# Patient Record
Sex: Male | Born: 1945
Health system: Southern US, Community
[De-identification: ages and names within clinical notes are randomized; demographics above are authoritative.]

## PROBLEM LIST (undated history)

## (undated) DIAGNOSIS — M48061 Spinal stenosis, lumbar region without neurogenic claudication: Secondary | ICD-10-CM

## (undated) DIAGNOSIS — T8859XA Other complications of anesthesia, initial encounter: Secondary | ICD-10-CM

## (undated) DIAGNOSIS — N400 Enlarged prostate without lower urinary tract symptoms: Secondary | ICD-10-CM

## (undated) DIAGNOSIS — H269 Unspecified cataract: Secondary | ICD-10-CM

## (undated) DIAGNOSIS — L02612 Cutaneous abscess of left foot: Secondary | ICD-10-CM

## (undated) DIAGNOSIS — I10 Essential (primary) hypertension: Secondary | ICD-10-CM

## (undated) DIAGNOSIS — L0390 Cellulitis, unspecified: Secondary | ICD-10-CM

## (undated) DIAGNOSIS — M549 Dorsalgia, unspecified: Secondary | ICD-10-CM

## (undated) DIAGNOSIS — M1990 Unspecified osteoarthritis, unspecified site: Secondary | ICD-10-CM

## (undated) DIAGNOSIS — E669 Obesity, unspecified: Secondary | ICD-10-CM

## (undated) HISTORY — PX: APPENDECTOMY: SHX54

## (undated) HISTORY — PX: JOINT REPLACEMENT: SHX530

## (undated) HISTORY — DX: Unspecified cataract: H26.9

## (undated) HISTORY — PX: OTHER SURGICAL HISTORY: SHX169

## (undated) HISTORY — DX: Spinal stenosis, lumbar region without neurogenic claudication: M48.061

## (undated) HISTORY — PX: KNEE ARTHROSCOPY: SUR90

## (undated) HISTORY — PX: CHOLECYSTECTOMY: SHX55

---
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
## 1980-01-05 HISTORY — PX: GASTRIC FUNDOPLICATION: SHX226

## 1982-01-04 HISTORY — PX: CHOLECYSTECTOMY: SHX55

## 1999-09-21 ENCOUNTER — Ambulatory Visit (HOSPITAL_COMMUNITY): Admission: RE | Admit: 1999-09-21 | Discharge: 1999-09-21 | Payer: Self-pay | Admitting: Orthopedic Surgery

## 1999-09-21 ENCOUNTER — Encounter: Payer: Self-pay | Admitting: Orthopedic Surgery

## 2011-07-30 ENCOUNTER — Inpatient Hospital Stay (HOSPITAL_COMMUNITY)
Admission: EM | Admit: 2011-07-30 | Discharge: 2011-08-03 | DRG: 603 | Disposition: A | Discharge status: Home / Self Care | Payer: Worker's Compensation | Attending: Family Medicine | Admitting: Family Medicine

## 2011-07-30 ENCOUNTER — Encounter (HOSPITAL_COMMUNITY): Payer: Self-pay | Admitting: Emergency Medicine

## 2011-07-30 DIAGNOSIS — S99919A Unspecified injury of unspecified ankle, initial encounter: Secondary | ICD-10-CM | POA: Diagnosis present

## 2011-07-30 DIAGNOSIS — L0291 Cutaneous abscess, unspecified: Secondary | ICD-10-CM

## 2011-07-30 DIAGNOSIS — L03119 Cellulitis of unspecified part of limb: Principal | ICD-10-CM | POA: Diagnosis present

## 2011-07-30 DIAGNOSIS — E8881 Metabolic syndrome: Secondary | ICD-10-CM | POA: Diagnosis present

## 2011-07-30 DIAGNOSIS — R609 Edema, unspecified: Secondary | ICD-10-CM | POA: Diagnosis present

## 2011-07-30 DIAGNOSIS — W208XXA Other cause of strike by thrown, projected or falling object, initial encounter: Secondary | ICD-10-CM | POA: Diagnosis present

## 2011-07-30 DIAGNOSIS — M7989 Other specified soft tissue disorders: Secondary | ICD-10-CM | POA: Diagnosis present

## 2011-07-30 DIAGNOSIS — Y9289 Other specified places as the place of occurrence of the external cause: Secondary | ICD-10-CM

## 2011-07-30 DIAGNOSIS — S8990XA Unspecified injury of unspecified lower leg, initial encounter: Secondary | ICD-10-CM | POA: Diagnosis present

## 2011-07-30 DIAGNOSIS — Y990 Civilian activity done for income or pay: Secondary | ICD-10-CM

## 2011-07-30 DIAGNOSIS — Z79899 Other long term (current) drug therapy: Secondary | ICD-10-CM

## 2011-07-30 DIAGNOSIS — L988 Other specified disorders of the skin and subcutaneous tissue: Secondary | ICD-10-CM | POA: Diagnosis present

## 2011-07-30 DIAGNOSIS — L0390 Cellulitis, unspecified: Secondary | ICD-10-CM

## 2011-07-30 DIAGNOSIS — N400 Enlarged prostate without lower urinary tract symptoms: Secondary | ICD-10-CM | POA: Diagnosis present

## 2011-07-30 DIAGNOSIS — I10 Essential (primary) hypertension: Secondary | ICD-10-CM | POA: Diagnosis present

## 2011-07-30 DIAGNOSIS — L02419 Cutaneous abscess of limb, unspecified: Principal | ICD-10-CM | POA: Diagnosis present

## 2011-07-30 DIAGNOSIS — Z6841 Body Mass Index (BMI) 40.0 and over, adult: Secondary | ICD-10-CM

## 2011-07-30 HISTORY — DX: Benign prostatic hyperplasia without lower urinary tract symptoms: N40.0

## 2011-07-30 HISTORY — DX: Dorsalgia, unspecified: M54.9

## 2011-07-30 HISTORY — DX: Essential (primary) hypertension: I10

## 2011-07-30 HISTORY — DX: Unspecified osteoarthritis, unspecified site: M19.90

## 2011-07-30 LAB — CBC WITH DIFFERENTIAL/PLATELET
Basophils Absolute: 0 10*3/uL (ref 0.0–0.1)
Basophils Relative: 0 % (ref 0–1)
Eosinophils Absolute: 0.2 10*3/uL (ref 0.0–0.7)
Eosinophils Relative: 1 % (ref 0–5)
HCT: 41.1 % (ref 39.0–52.0)
Hemoglobin: 13.9 g/dL (ref 13.0–17.0)
Lymphocytes Relative: 6 % — ABNORMAL LOW (ref 12–46)
Lymphs Abs: 0.8 10*3/uL (ref 0.7–4.0)
MCH: 30.1 pg (ref 26.0–34.0)
MCHC: 33.8 g/dL (ref 30.0–36.0)
MCV: 89 fL (ref 78.0–100.0)
Monocytes Absolute: 1.8 10*3/uL — ABNORMAL HIGH (ref 0.1–1.0)
Monocytes Relative: 12 % (ref 3–12)
Neutro Abs: 11.8 10*3/uL — ABNORMAL HIGH (ref 1.7–7.7)
Neutrophils Relative %: 81 % — ABNORMAL HIGH (ref 43–77)
Platelets: 242 10*3/uL (ref 150–400)
RBC: 4.62 MIL/uL (ref 4.22–5.81)
RDW: 13.7 % (ref 11.5–15.5)
WBC: 14.6 10*3/uL — ABNORMAL HIGH (ref 4.0–10.5)

## 2011-07-30 LAB — COMPREHENSIVE METABOLIC PANEL
ALT: 13 U/L (ref 0–53)
AST: 18 U/L (ref 0–37)
Albumin: 3 g/dL — ABNORMAL LOW (ref 3.5–5.2)
Alkaline Phosphatase: 96 U/L (ref 39–117)
BUN: 21 mg/dL (ref 6–23)
CO2: 26 mEq/L (ref 19–32)
Calcium: 9 mg/dL (ref 8.4–10.5)
Chloride: 102 mEq/L (ref 96–112)
Creatinine, Ser: 0.93 mg/dL (ref 0.50–1.35)
GFR calc Af Amer: >90 mL/min (ref >90)
GFR calc non Af Amer: 86 mL/min — ABNORMAL LOW (ref >90)
Glucose, Bld: 109 mg/dL — ABNORMAL HIGH (ref 70–99)
Potassium: 3.6 mEq/L (ref 3.5–5.1)
Sodium: 138 mEq/L (ref 135–145)
Total Bilirubin: 1.4 mg/dL — ABNORMAL HIGH (ref 0.3–1.2)
Total Protein: 7.2 g/dL (ref 6.0–8.3)

## 2011-07-30 LAB — D-DIMER, QUANTITATIVE: D-Dimer, Quant: 0.99 ug/mL-FEU — ABNORMAL HIGH (ref 0.00–0.48)

## 2011-07-30 IMAGING — CR DG FOOT COMPLETE 3+V*L*
2 series · 2 of 2 positions shown · non-contrast
Comparison: None

CLINICAL DATA: 65-year-old male with left foot pain and swelling
following injury.

LEFT FOOT - COMPLETE 3+ VIEW

[AP]
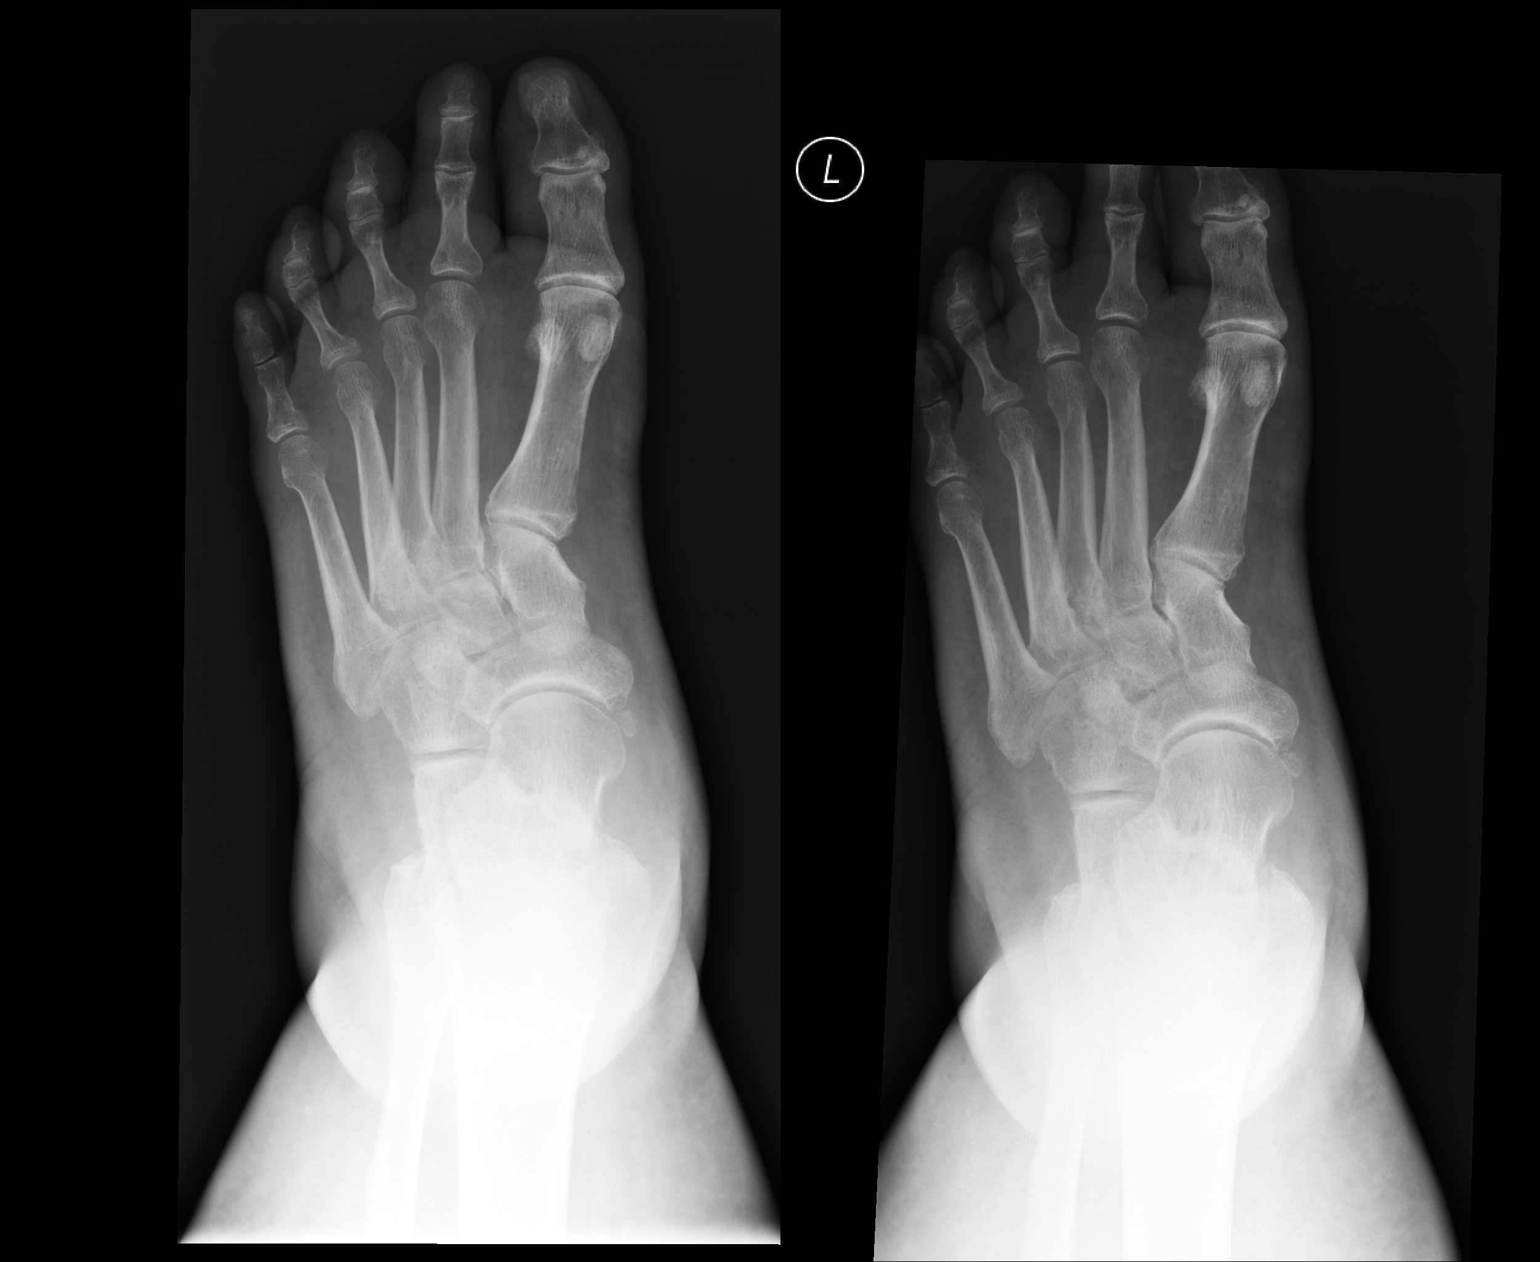

[lateral]
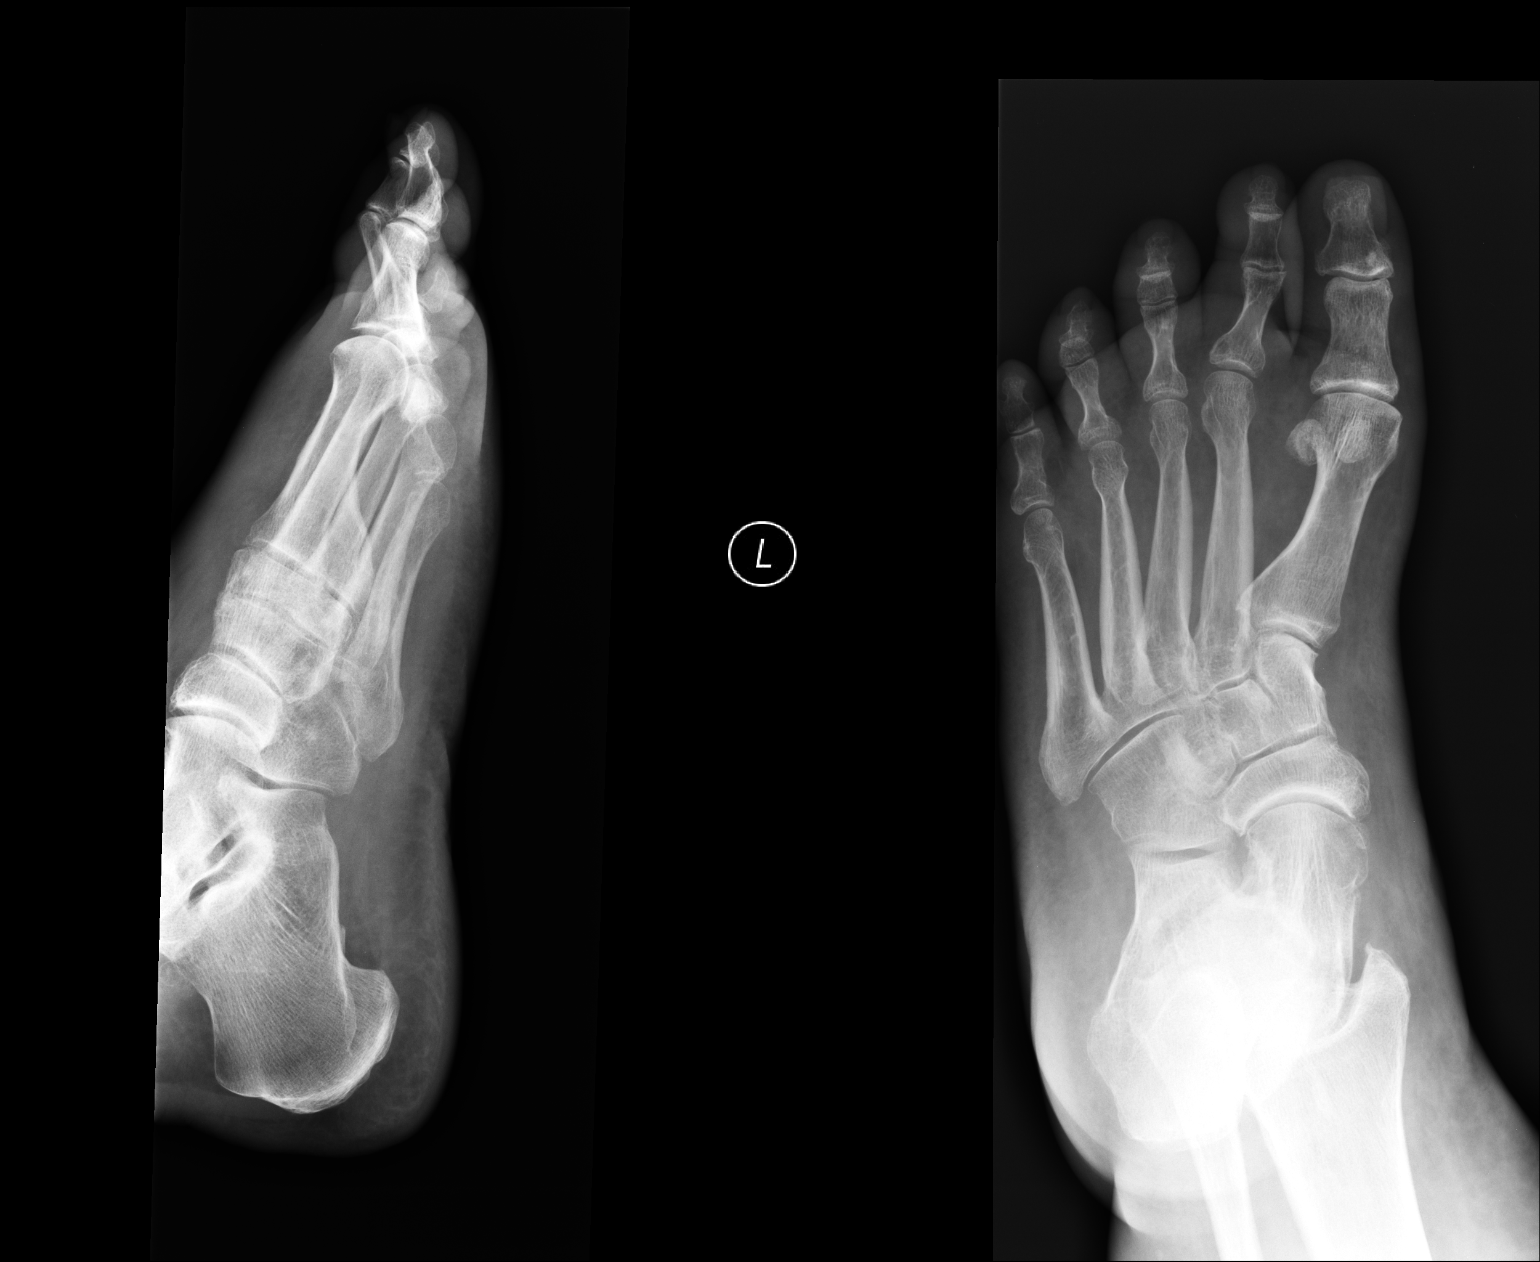

[2 of 2 positions shown; findings below may reference images not displayed]

FINDINGS: There is no evidence of acute fracture, subluxation, or
dislocation.
The Lisfranc joints are intact.
No focal bony lesions are identified.
There is no evidence of radiopaque foreign body.

Midfoot degenerative changes are identified.
IMPRESSION: No evidence of acute bony abnormality.

## 2011-07-30 MED ORDER — HYDROMORPHONE HCL PF 1 MG/ML IJ SOLN
1.0000 mg | Freq: Once | INTRAMUSCULAR | Status: AC
  Administered 2011-07-30: 1 mg via INTRAVENOUS
  Filled 2011-07-30: qty 1

## 2011-07-30 MED ORDER — VANCOMYCIN HCL IN DEXTROSE 1-5 GM/200ML-% IV SOLN
1000.0000 mg | Freq: Once | INTRAVENOUS | Status: AC
  Administered 2011-07-30: 1000 mg via INTRAVENOUS
  Filled 2011-07-30: qty 200

## 2011-07-30 MED ORDER — ONDANSETRON HCL 4 MG/2ML IJ SOLN
4.0000 mg | Freq: Once | INTRAMUSCULAR | Status: AC
  Administered 2011-07-30: 4 mg via INTRAVENOUS
  Filled 2011-07-30: qty 2

## 2011-07-30 MED ORDER — ENOXAPARIN SODIUM 150 MG/ML ~~LOC~~ SOLN
150.0000 mg | Freq: Once | SUBCUTANEOUS | Status: AC
  Administered 2011-07-30: 150 mg via SUBCUTANEOUS
  Filled 2011-07-30: qty 1

## 2011-07-30 NOTE — ED Notes (Signed)
Pt states he is having pain in his left leg and foot   Pt states pain started Monday after riding on an airplane  [facility] and sent here for possible DVT   Pt has lab work and CD with him that was sent from there  Pt has swelling noted to his foot and leg with redness noted

## 2011-07-30 NOTE — ED Notes (Signed)
RUE:[id number]<JW> Expected date:<BR> Expected time:<BR> Means of arrival:<BR> Comments:<BR> Hold for triage 3

## 2011-07-30 NOTE — ED Provider Notes (Signed)
History     CSN: [id number]  Arrival date & time 07/30/11  1940   First MD Initiated Contact with Patient 07/30/11 2024      Chief Complaint  Patient presents with  . Leg Pain    (Consider location/radiation/quality/duration/timing/severity/associated sxs/prior treatment) HPI Comments: Patient states, that approximately one month ago.  He dropped a steel bar on his left foot, causing bruising that has resolved.  He did fly to [place], on Monday, which was 4, days ago, and noticed on arrival, that he has significant swelling and pain in his left foot, and erythema.  This erythema has now extended to just below the knee.  Today, he developed myalgias, and felt "bad."  He went to urgent care, who performed a CBC, which revealed a white count of 17.5.  I also x-rayed his foot, which I reviewed and reveals no fractures or gas within the tissues.  They sent him to the emergency department to be further evaluated for DVT and to treat the elevated white count  Patient is a 66 y.o. male presenting with leg pain. The history is provided by the patient.  Leg Pain  The incident occurred more than 2 days ago. The incident occurred at work. The injury mechanism is unknown. The pain is present in the left leg and left foot. The pain is at a severity of 5/10. The pain is moderate. The pain has been constant since onset. Pertinent negatives include no numbness, no loss of motion and no loss of sensation. The symptoms are aggravated by activity.    Past Medical History  Diagnosis Date  . Back pain   . Arthritis   . Hypertension   . Enlarged prostate     Past Surgical History  Procedure Date  . Stomach stapled   . Cholecystectomy   . Appendectomy   . Arthroscopic knee surgery     Family History  Problem Relation Age of Onset  . Diabetes Other   . Stroke Other   . Hypertension Other   . Coronary artery disease Other   . Heart failure Other     History  Substance Use Topics  . Smoking  status: Not on file  . Smokeless tobacco: Not on file  . Alcohol Use: No      Review of Systems  Constitutional: Positive for chills. Negative for fever.  Respiratory: Negative for shortness of breath.   Gastrointestinal: Negative for nausea.  Musculoskeletal: Positive for joint swelling.  Skin: Positive for wound.  Neurological: Negative for dizziness, weakness and numbness.    Allergies  Review of patient's allergies indicates no known allergies.  Home Medications   Current Outpatient Rx  Name Route Sig Dispense Refill  . GLUCOSAMINE-CHONDROITIN 500-400 MG PO TABS Oral Take 1 tablet by mouth 2 (two) times daily.    Marland Kitchen HYDROCODONE-ACETAMINOPHEN 5-500 MG PO TABS Oral Take 1 tablet by mouth every 6 (six) hours as needed. FOR PAIN    . INDOMETHACIN 25 MG PO CAPS Oral Take 50 mg by mouth 2 (two) times daily with a meal.    . SAW PALMETTO (SERENOA REPENS) 80 MG PO CAPS Oral Take 160 mg by mouth 2 (two) times daily.      BP 125/65  Pulse 106  Temp 99.5 F (37.5 C) (Oral)  Resp 20  Ht 6' (1.829 m)  Wt 333 lb (151.048 kg)  BMI 45.16 kg/m2  SpO2 92%  Physical Exam  Constitutional: He is oriented to person, place, and time. He  appears well-developed and well-nourished.  HENT:  Head: Normocephalic and atraumatic.  Eyes: Pupils are equal, round, and reactive to light.  Neck: Normal range of motion.  Cardiovascular: Normal rate.   Pulmonary/Chest: Effort normal.  Musculoskeletal: He exhibits edema and tenderness.       Legs:      Erythema tenderness, and swelling to just below the knee on the left side as indicated by the growing  Neurological: He is alert and oriented to person, place, and time.  Skin: Skin is warm.    ED Course  Procedures (including critical care time)  Labs Reviewed  CBC WITH DIFFERENTIAL - Abnormal; Notable for the following:    WBC 14.6 (*)     Neutrophils Relative 81 (*)     Neutro Abs 11.8 (*)     Lymphocytes Relative 6 (*)     Monocytes  Absolute 1.8 (*)     All other components within normal limits  COMPREHENSIVE METABOLIC PANEL - Abnormal; Notable for the following:    Glucose, Bld 109 (*)     Albumin 3.0 (*)     Total Bilirubin 1.4 (*)     GFR calc non Af Amer 86 (*)     All other components within normal limits  D-DIMER, QUANTITATIVE - Abnormal; Notable for the following:    D-Dimer, Quant 0.99 (*)     All other components within normal limits  CULTURE, BLOOD (ROUTINE X 2)  CULTURE, BLOOD (ROUTINE X 2)   No results found.   1. Cellulitis       MDM  On exam, patient appears to have a cellulitis of his left foot, and calf with pain swelling, and erythema.  There is no breaking the skin.  He does have positive respect is for DVT including obesity, recent travel, and recent trauma to the foot unfortunately it is 8:48 PM an ultrasound,/vascular Doppler is unavailable at this time.  I will evaluate with a d-dimer I spoke with Dr. Theresia Bough, who will admit this patient for cellulitis.  A positive d-dimer to be ruled out for DVT in the morning with vascular Doppler        Arman Filter, NP 07/30/11 2347  Arman Filter, NP 07/30/11 1610

## 2011-07-31 ENCOUNTER — Encounter (HOSPITAL_COMMUNITY): Payer: Self-pay | Admitting: General Practice

## 2011-07-31 DIAGNOSIS — L0390 Cellulitis, unspecified: Secondary | ICD-10-CM | POA: Diagnosis present

## 2011-07-31 DIAGNOSIS — M7989 Other specified soft tissue disorders: Secondary | ICD-10-CM

## 2011-07-31 DIAGNOSIS — M79609 Pain in unspecified limb: Secondary | ICD-10-CM

## 2011-07-31 HISTORY — DX: Cellulitis, unspecified: L03.90

## 2011-07-31 LAB — CBC
MCV: 90.1 fL (ref 78.0–100.0)
Platelets: 214 10*3/uL (ref 150–400)
RDW: 13.9 % (ref 11.5–15.5)
WBC: 12.6 10*3/uL — ABNORMAL HIGH (ref 4.0–10.5)

## 2011-07-31 LAB — BASIC METABOLIC PANEL
Calcium: 8.7 mg/dL (ref 8.4–10.5)
Creatinine, Ser: 0.92 mg/dL (ref 0.50–1.35)
GFR calc Af Amer: >90 mL/min (ref >90)

## 2011-07-31 MED ORDER — ONDANSETRON HCL 4 MG PO TABS: 4.0000 mg | ORAL_TABLET | Freq: Four times a day (QID) | ORAL | Status: DC | PRN

## 2011-07-31 MED ORDER — ENOXAPARIN SODIUM 150 MG/ML ~~LOC~~ SOLN
150.0000 mg | Freq: Two times a day (BID) | SUBCUTANEOUS | Status: DC
  Administered 2011-07-31: 150 mg via SUBCUTANEOUS
  Filled 2011-07-31 (×2): qty 1

## 2011-07-31 MED ORDER — ONDANSETRON HCL 4 MG/2ML IJ SOLN: 4.0000 mg | Freq: Four times a day (QID) | INTRAMUSCULAR | Status: DC | PRN

## 2011-07-31 MED ORDER — VANCOMYCIN HCL IN DEXTROSE 1-5 GM/200ML-% IV SOLN
1000.0000 mg | Freq: Once | INTRAVENOUS | Status: AC
  Administered 2011-07-31: 1000 mg via INTRAVENOUS
  Filled 2011-07-31: qty 200

## 2011-07-31 MED ORDER — VANCOMYCIN HCL 1000 MG IV SOLR
1500.0000 mg | Freq: Two times a day (BID) | INTRAVENOUS | Status: DC
  Administered 2011-07-31 – 2011-08-01 (×3): 1500 mg via INTRAVENOUS
  Filled 2011-07-31 (×4): qty 1500

## 2011-07-31 MED ORDER — MORPHINE SULFATE 2 MG/ML IJ SOLN: 1.0000 mg | INTRAMUSCULAR | Status: DC | PRN

## 2011-07-31 MED ORDER — HYDROCODONE-ACETAMINOPHEN 5-325 MG PO TABS
1.0000 | ORAL_TABLET | ORAL | Status: DC | PRN
  Administered 2011-07-31 – 2011-08-02 (×6): 1 via ORAL
  Administered 2011-08-02 (×2): 2 via ORAL
  Filled 2011-07-31: qty 1
  Filled 2011-07-31: qty 2
  Filled 2011-07-31: qty 1
  Filled 2011-07-31: qty 2
  Filled 2011-07-31 (×4): qty 1

## 2011-07-31 MED ORDER — ENOXAPARIN SODIUM 30 MG/0.3ML ~~LOC~~ SOLN: 30.0000 mg | SUBCUTANEOUS | Status: DC

## 2011-07-31 MED ORDER — SODIUM CHLORIDE 0.9 % IV SOLN
INTRAVENOUS | Status: DC
  Administered 2011-07-31 – 2011-08-01 (×3): via INTRAVENOUS

## 2011-07-31 NOTE — ED Provider Notes (Signed)
Medical screening examination/treatment/procedure(s) were conducted as a shared visit with non-physician practitioner(s) and myself.  I personally evaluated the patient during the encounter  Derwood Kaplan, MD 07/31/11 1450

## 2011-07-31 NOTE — Progress Notes (Signed)
Paged Dr. Mahala Menghini that patient is a full code here but states he has an advance directive at home with a DNR

## 2011-07-31 NOTE — Progress Notes (Signed)
PROGRESS NOTE  Keith Watkins UEA:[id number] DOB: Jun 18, 1945 DOA: 07/30/2011 PCP: No primary provider on file.  Brief narrative: 66 yr old Male admit 7/27 with LLE edema  Past medical history-As per Problem list Chart reviewed as below- H/o   Consultants:  None so far  Procedures:  LLE Duplex 7/27=Negative for DVT  Antibiotics:  Vancomycin 7/27   Subjective  Feels well.  NO CP, N ,V,SOb, Blurred or double vision, weakness States nmo inciting event-but had a remote injury to leg 613 with a steel Bar falling on his leg   Objective    Objective: Filed Vitals:   07/31/11 0000 07/31/11 0100 07/31/11 0347 07/31/11 0520  BP: 102/44 125/59 116/77 124/64  Pulse: 101 102 90 93  Temp:   98.1 F (36.7 C) 99.4 F (37.4 C)  TempSrc:   Oral Oral  Resp: 19 19 20 18   Height:      Weight:      SpO2: 93% 89% 97% 98%    Intake/Output Summary (Last 24 hours) at 07/31/11 1221 Last data filed at 07/31/11 1115  Gross per 24 hour  Intake 646.25 ml  Output    500 ml  Net 146.25 ml    Exam:  General: Alert pleasant obese Caucasian male, no icterus or pallor, Mallampati stage III Cardiovascular: S1-S2 no murmur or gallop, no bruit Respiratory: Clinically clear no added sound Abdomen: Morbidly obese. Surgical scars noted Skin diffuse and impressive swelling left lower extremity circumferentially from mid tibia downwards with redness and blebs over medial malleolus Neuro motor grossly intact as is sensation  Data Reviewed: Basic Metabolic Panel:  Lab 07/31/11 4782 07/30/11 2040  NA 137 138  K 3.7 3.6  CL 103 102  CO2 28 26  GLUCOSE 108* 109*  BUN 20 21  CREATININE 0.92 0.93  CALCIUM 8.7 9.0  MG -- --  PHOS -- --   Liver Function Tests:  Lab 07/30/11 2040  AST 18  ALT 13  ALKPHOS 96  BILITOT 1.4*  PROT 7.2  ALBUMIN 3.0*   No results found for this basename: LIPASE:5,AMYLASE:5 in the last 168 hours No results found for this basename: AMMONIA:5 in the last 168  hours CBC:  Lab 07/31/11 0515 07/30/11 2040  WBC 12.6* 14.6*  NEUTROABS -- 11.8*  HGB 12.0* 13.9  HCT 36.5* 41.1  MCV 90.1 89.0  PLT 214 242   Cardiac Enzymes: No results found for this basename: CKTOTAL:5,CKMB:5,CKMBINDEX:5,TROPONINI:5 in the last 168 hours BNP: No components found with this basename: POCBNP:5 CBG: No results found for this basename: GLUCAP:5 in the last 168 hours  No results found for this or any previous visit (from the past 240 hour(s)).   Studies:              All Imaging reviewed and is as per above notation   Scheduled Meds:   . enoxaparin (LOVENOX) injection  150 mg Subcutaneous Once  .  HYDROmorphone (DILAUDID) injection  1 mg Intravenous Once  . ondansetron  4 mg Intravenous Once  . vancomycin  1,500 mg Intravenous Q12H  . vancomycin  1,000 mg Intravenous Once  . vancomycin  1,000 mg Intravenous Once  . DISCONTD: enoxaparin (LOVENOX) injection  150 mg Subcutaneous Q12H  . DISCONTD: enoxaparin (LOVENOX) injection  30 mg Subcutaneous Q24H   Continuous Infusions:   . sodium chloride 75 mL/hr at 07/31/11 0123     Assessment/Plan: 1. Left lower extremity swelling-duplex 7/27 rules out DVT. Continue vancomycin. Repeat CBC in the morning.  Potentially can narrow antibiotics to oral. 2. Elevated bilirubin-repeat CMet in the morning, INR 3. Morbid obesity, Body mass index is 45.16 kg/(m^2).-need dietary education counseling. ? Sleep apnea-patient needs followup as an outpatient for this Metabolic syndrome  Code Status: Full Family Communication: Discussed with family members in room Disposition Plan: Home when cellulitis appears better   Pleas Koch, MD  [facility] Pager [phone] 07/31/2011, 12:21 PM    LOS: 1 day

## 2011-07-31 NOTE — Progress Notes (Signed)
Had vascular lab tech paged to find out time for venous duplex

## 2011-07-31 NOTE — Progress Notes (Signed)
ANTIBIOTIC CONSULT NOTE - INITIAL  Pharmacy Consult for vancomycin/lovenox Indication: Cellulitis  No Known Allergies  Patient Measurements: Height: 6' (182.9 cm) Weight: 333 lb (151.048 kg) IBW/kg (Calculated) : 77.6  Adjusted Body Weight:   Vital Signs: Temp: 99.5 F (37.5 C) (07/26 1950) Temp src: Oral (07/26 1950) BP: 125/65 mmHg (07/26 2300) Pulse Rate: 106  (07/26 2300) Intake/Output from previous day:   Intake/Output from this shift:    Labs:  Basename 07/30/11 2040  WBC 14.6*  HGB 13.9  PLT 242  LABCREA --  CREATININE 0.93   Estimated Creatinine Clearance: 119.8 ml/min (by C-G formula based on Cr of 0.93). No results found for this basename: VANCOTROUGH:2,VANCOPEAK:2,VANCORANDOM:2,GENTTROUGH:2,GENTPEAK:2,GENTRANDOM:2,TOBRATROUGH:2,TOBRAPEAK:2,TOBRARND:2,AMIKACINPEAK:2,AMIKACINTROU:2,AMIKACIN:2, in the last 72 hours   Microbiology: No results found for this or any previous visit (from the past 720 hour(s)).  Medical History: Past Medical History  Diagnosis Date  . Back pain   . Arthritis   . Hypertension   . Enlarged prostate     Medications:  Scheduled:    . enoxaparin (LOVENOX) injection  150 mg Subcutaneous Once  . enoxaparin (LOVENOX) injection  150 mg Subcutaneous Q12H  .  HYDROmorphone (DILAUDID) injection  1 mg Intravenous Once  . ondansetron  4 mg Intravenous Once  . vancomycin  1,500 mg Intravenous Q12H  . vancomycin  1,000 mg Intravenous Once  . vancomycin  1,000 mg Intravenous Once  . DISCONTD: enoxaparin (LOVENOX) injection  30 mg Subcutaneous Q24H   Anti-infectives     Start     Dose/Rate Route Frequency Ordered Stop   07/31/11 1000   vancomycin (VANCOCIN) 1,500 mg in sodium chloride 0.9 % 500 mL IVPB        1,500 mg 250 mL/hr over 120 Minutes Intravenous Every 12 hours 07/31/11 0133     07/31/11 0045   vancomycin (VANCOCIN) IVPB 1000 mg/200 mL premix        1,000 mg 200 mL/hr over 60 Minutes Intravenous  Once 07/31/11 0031     07/30/11 2300   vancomycin (VANCOCIN) IVPB 1000 mg/200 mL premix        1,000 mg 200 mL/hr over 60 Minutes Intravenous  Once 07/30/11 2225 07/31/11 0028         Assessment: Patient with cellulitis and DVT.  First dose of antibiotics already given in ED.  Goal of Therapy:  Vancomycin trough level 10-15 mcg/ml Enoxaparin dosed based on patient weight and renal function   Plan:  Measure antibiotic drug levels at steady state Follow up culture results Vancomycin 1500mg  iv q12hr lovenox 150mg  sq q12hr  Darlina Guys, Jacquenette Shone Crowford 07/31/2011,1:40 AM

## 2011-07-31 NOTE — ED Notes (Signed)
Family leaving, left contact info:Keith Watkins (wife) cell [phone].

## 2011-07-31 NOTE — Progress Notes (Signed)
VASCULAR LAB PRELIMINARY  PRELIMINARY  PRELIMINARY  PRELIMINARY  *Left lower extremity venous Doppler completed.    Preliminary report:  There is no DVT or SVT noted in the left lower extremity.  Keith Watkins, 07/31/2011, 11:28 AM

## 2011-07-31 NOTE — Progress Notes (Signed)
Patient cellulitis reddened area to left lower leg marked per request of MD

## 2011-07-31 NOTE — ED Notes (Signed)
Pt transported via stretcher with chart and personal belongings accompanied by this nurse, condition stable at time of transfer. 

## 2011-07-31 NOTE — H&P (Signed)
[facility] History and Physical  ANKITH EDMONSTON WUJ:[id number] DOB: [birth year] DOA: 07/30/2011  Referring physician: ED physician PCP: No primary provider on file.   Chief Complaint: Left lower extremity pain and swelling  HPI:  Pt is 66 yo male who presents to [facility] with main concern of progressively worsening left lower extremity swelling and redness that started few days ago but he reports have an injury to that leg about one month ago and has done rather well. Approximately 4 days ago he flew to [place] and when he came back he noticed the above symptoms. He also describes it as painful, pressure like pain, constant and 7/10 in severity, worse with movement and better with rest. No other aggravating or alleviating factors. He also reports subjective fevers, chills, but denies any abdominal or urinary concerns, no chest pain or shortness of breath.  Assessment and Plan: Principal Problem:  *Cellulitis - unclear if there is also underlying DVT - pt has received Lovenox in ED and was dosed per pharmacy - will obtain left lower extremity doppler for further evaluation and Lovenox can be discontinued if doppler is negative - will also continue Vancomycin - provide supportive care with IVF, analgesia and antiemetics as needed - CBC in AM to follow up on leukocytosis   Code Status: Full Family Communication: Pt at bedside Disposition Plan: Admit to medical floor   Review of Systems:  Constitutional: Positive for fever, chills and malaise/fatigue. Negative for diaphoresis.  HENT: Negative for hearing loss, ear pain, nosebleeds, congestion, sore throat, neck pain, tinnitus and ear discharge.   Eyes: Negative for blurred vision, double vision, photophobia, pain, discharge and redness.  Respiratory: Negative for cough, hemoptysis, sputum production, shortness of breath, wheezing and stridor.   Cardiovascular: Negative for chest pain, palpitations, orthopnea, claudication and leg swelling.    Gastrointestinal: Negative for nausea, vomiting and abdominal pain. Negative for heartburn, constipation, blood in stool and melena.  Genitourinary: Negative for dysuria, urgency, frequency, hematuria and flank pain.  Musculoskeletal: Negative for myalgias, back pain, joint pain and falls. Positive for left lower extremity pain. Skin: Negative for itching and rash.  Neurological: Negative for dizziness and weakness. Negative for tingling, tremors, sensory change, speech change, focal weakness, loss of consciousness and headaches.  Endo/Heme/Allergies: Negative for environmental allergies and polydipsia. Does not bruise/bleed easily.  Psychiatric/Behavioral: Negative for suicidal ideas. The patient is not nervous/anxious.      Past Medical History  Diagnosis Date  . Back pain   . Arthritis   . Hypertension   . Enlarged prostate     Past Surgical History  Procedure Date  . Stomach stapled   . Cholecystectomy   . Appendectomy   . Arthroscopic knee surgery     Social History:  does not have a smoking history on file. He does not have any smokeless tobacco history on file. He reports that he does not drink alcohol or use illicit drugs.  No Known Allergies  Family History  Problem Relation Age of Onset  . Diabetes Other   . Stroke Other   . Hypertension Other   . Coronary artery disease Other   . Heart failure Other     Prior to Admission medications   Medication Sig Start Date End Date Taking? Authorizing Provider  glucosamine-chondroitin 500-400 MG tablet Take 1 tablet by mouth 2 (two) times daily.   Yes Historical Provider, MD  HYDROcodone-acetaminophen (VICODIN) 5-500 MG per tablet Take 1 tablet by mouth every 6 (six) hours as needed. FOR  PAIN   Yes Historical Provider, MD  indomethacin (INDOCIN) 25 MG capsule Take 50 mg by mouth 2 (two) times daily with a meal.   Yes Historical Provider, MD  saw palmetto 80 MG capsule Take 160 mg by mouth 2 (two) times daily.   Yes  Historical Provider, MD    Physical Exam: Filed Vitals:   07/30/11 2121 07/30/11 2200 07/30/11 2235 07/30/11 2300  BP: 120/57 141/67  125/65  Pulse: 103 103  106  Temp:      TempSrc:      Resp: 22 18  20   Height:   6' (1.829 m)   Weight:   333 lb (151.048 kg)   SpO2: 96% 97%  92%    Physical Exam  Constitutional: Appears well-developed and well-nourished. No distress.  HENT: Normocephalic. External right and left ear normal. Oropharynx is clear and moist.  Eyes: Conjunctivae and EOM are normal. PERRLA, no scleral icterus.  Neck: Normal ROM. Neck supple. No JVD. No tracheal deviation. No thyromegaly.  CVS: Regular rhythm but tachycardic, S1/S2 +, no murmurs, no gallops, no carotid bruit.  Pulmonary: Effort and breath sounds normal, no stridor, rhonchi, wheezes, rales.  Abdominal: Soft. BS +,  no distension, tenderness, rebound or guarding.  Musculoskeletal: Normal range of motion. Left lower extremity edema from foot to mid shin area, erythema and tenderness to palpation, warmth to tocuh Lymphadenopathy: No lymphadenopathy noted, cervical, inguinal. Neuro: Alert. Normal reflexes, muscle tone coordination. No cranial nerve deficit. Psychiatric: Normal mood and affect. Behavior, judgment, thought content normal.   Labs on Admission:  Basic Metabolic Panel:  Lab 07/30/11 7829  NA 138  K 3.6  CL 102  CO2 26  GLUCOSE 109*  BUN 21  CREATININE 0.93  CALCIUM 9.0  MG --  PHOS --   Liver Function Tests:  Lab 07/30/11 2040  AST 18  ALT 13  ALKPHOS 96  BILITOT 1.4*  PROT 7.2  ALBUMIN 3.0*   CBC:  Lab 07/30/11 2040  WBC 14.6*  NEUTROABS 11.8*  HGB 13.9  HCT 41.1  MCV 89.0  PLT 242    Radiological Exams on Admission: No results found.  EKG: Normal sinus rhythm, no ST/T wave changes  Debbora Presto, MD  [facility] Pager [phone]  If 7PM-7AM, please contact night-coverage [email or web address] Password [facility] 07/31/2011, 12:21 AM

## 2011-08-01 LAB — COMPREHENSIVE METABOLIC PANEL
CO2: 25 mEq/L (ref 19–32)
Calcium: 8.3 mg/dL — ABNORMAL LOW (ref 8.4–10.5)
Creatinine, Ser: 0.8 mg/dL (ref 0.50–1.35)
GFR calc Af Amer: >90 mL/min (ref >90)
GFR calc non Af Amer: >90 mL/min (ref >90)
Glucose, Bld: 100 mg/dL — ABNORMAL HIGH (ref 70–99)
Total Protein: 5.6 g/dL — ABNORMAL LOW (ref 6.0–8.3)

## 2011-08-01 LAB — CBC WITH DIFFERENTIAL/PLATELET
Eosinophils Absolute: 0.6 10*3/uL (ref 0.0–0.7)
Eosinophils Relative: 7 % — ABNORMAL HIGH (ref 0–5)
HCT: 34 % — ABNORMAL LOW (ref 39.0–52.0)
Lymphocytes Relative: 15 % (ref 12–46)
Lymphs Abs: 1.3 10*3/uL (ref 0.7–4.0)
MCH: 29.4 pg (ref 26.0–34.0)
MCV: 89.9 fL (ref 78.0–100.0)
Monocytes Absolute: 1.5 10*3/uL — ABNORMAL HIGH (ref 0.1–1.0)
Platelets: 221 10*3/uL (ref 150–400)
RBC: 3.78 MIL/uL — ABNORMAL LOW (ref 4.22–5.81)
RDW: 13.9 % (ref 11.5–15.5)
WBC: 9.1 10*3/uL (ref 4.0–10.5)

## 2011-08-01 LAB — PROTIME-INR
INR: 1.22 (ref 0.00–1.49)
Prothrombin Time: 15.7 seconds — ABNORMAL HIGH (ref 11.6–15.2)

## 2011-08-01 MED ORDER — IBUPROFEN 800 MG PO TABS
800.0000 mg | ORAL_TABLET | Freq: Four times a day (QID) | ORAL | Status: DC | PRN
Start: 2011-08-01 — End: 2011-08-03
  Administered 2011-08-03: 800 mg via ORAL
  Filled 2011-08-01: qty 1

## 2011-08-01 MED ORDER — TAMSULOSIN HCL 0.4 MG PO CAPS: 0.4000 mg | ORAL_CAPSULE | Freq: Every day | ORAL | Status: DC

## 2011-08-01 MED ORDER — TAMSULOSIN HCL 0.4 MG PO CAPS
0.4000 mg | ORAL_CAPSULE | Freq: Every day | ORAL | Status: DC
  Administered 2011-08-01: 0.4 mg via ORAL
  Filled 2011-08-01 (×2): qty 1

## 2011-08-01 MED ORDER — CEPHALEXIN 500 MG PO CAPS
500.0000 mg | ORAL_CAPSULE | Freq: Two times a day (BID) | ORAL | Status: DC
  Administered 2011-08-01 – 2011-08-03 (×5): 500 mg via ORAL
  Filled 2011-08-01 (×6): qty 1

## 2011-08-01 NOTE — Progress Notes (Signed)
Paged Dr. Mahala Menghini that pharmacy states now dose of flomax is to close to HS dose.

## 2011-08-01 NOTE — Progress Notes (Signed)
Patient reddness to IV site, IVF stopped. Unable to find appropriate vein to stick. Called IV team

## 2011-08-01 NOTE — Progress Notes (Signed)
Spoke to Dr. Mahala Menghini on floor about patient boil to top of foot that burst after shower, surrounding tissue black, covered with 4x4 gauze and that patient having amber colored urine

## 2011-08-01 NOTE — Progress Notes (Addendum)
PROGRESS NOTE  Keith Watkins:[id number] DOB: 09-12-1945 DOA: 07/30/2011 PCP: No primary provider on file.  Brief narrative: 65 yr old Male admit 7/27 with LLE edema  Past medical history-As per Problem list Chart reviewed as below- H/o   Consultants:  None so far  Procedures:  LLE Duplex 7/27=Negative for DVT  Antibiotics:  Vancomycin 7/27   Subjective  Feels well.  States had increasing lower extremity pain last night.  States has been having Multiple episodes of passing urine, and is not taking saw palmetto States pain is about 10 on 10 when he puts his leg down on the ground but it resolves when he lays back down to 1/10 Denies any other complaints, denies fever chills, shortness of breath Family in room    Objective    Objective: Filed Vitals:   07/31/11 0520 07/31/11 1445 07/31/11 2110 08/01/11 0513  BP: 124/64 123/64 129/82 123/63  Pulse: 93 90 89 72  Temp: 99.4 F (37.4 C) 99.6 F (37.6 C) 99.7 F (37.6 C) 97.9 F (36.6 C)  TempSrc: Oral Oral Oral Oral  Resp: 18 18 18 18   Height:      Weight:      SpO2: 98% 94% 93% 94%    Intake/Output Summary (Last 24 hours) at 08/01/11 1431 Last data filed at 08/01/11 1400  Gross per 24 hour  Intake   3040 ml  Output    450 ml  Net   2590 ml    Exam:  General: Alert pleasant obese Caucasian male, no icterus or pallor, Mallampati stage III Cardiovascular: S1-S2 no murmur or gallop, no bruit Respiratory: Clinically clear no added sound Abdomen: Morbidly obese. Surgical scars noted Skin diffuse and impressive swelling left lower extremity circumferentially from mid tibia downwards with redness and blebs over medial malleolus--swelling has declined from demarcation. Neuro motor grossly intact as is sensation  Data Reviewed: Basic Metabolic Panel:  Lab 08/01/11 9562 07/31/11 0515 07/30/11 2040  NA 136 137 138  K 3.6 3.7 --  CL 103 103 102  CO2 25 28 26   GLUCOSE 100* 108* 109*  BUN 15 20 21     CREATININE 0.80 0.92 0.93  CALCIUM 8.3* 8.7 9.0  MG -- -- --  PHOS -- -- --   Liver Function Tests:  Lab 08/01/11 0413 07/30/11 2040  AST 14 18  ALT 11 13  ALKPHOS 72 96  BILITOT 0.8 1.4*  PROT 5.6* 7.2  ALBUMIN 2.1* 3.0*   No results found for this basename: LIPASE:5,AMYLASE:5 in the last 168 hours No results found for this basename: AMMONIA:5 in the last 168 hours CBC:  Lab 08/01/11 0413 07/31/11 0515 07/30/11 2040  WBC 9.1 12.6* 14.6*  NEUTROABS 5.5 -- 11.8*  HGB 11.1* 12.0* 13.9  HCT 34.0* 36.5* 41.1  MCV 89.9 90.1 89.0  PLT 221 214 242   Cardiac Enzymes: No results found for this basename: CKTOTAL:5,CKMB:5,CKMBINDEX:5,TROPONINI:5 in the last 168 hours BNP: No components found with this basename: POCBNP:5 CBG: No results found for this basename: GLUCAP:5 in the last 168 hours  Recent Results (from the past 240 hour(s))  CULTURE, BLOOD (ROUTINE X 2)     Status: Normal (Preliminary result)   Collection Time   07/30/11  8:40 PM      Component Value Range Status Comment   Specimen Description BLOOD LEFT ARM   Final    Special Requests BOTTLES DRAWN AEROBIC AND ANAEROBIC    Final    Culture  Setup Time 07/31/2011 01:22  Final    Culture     Final    Value:        BLOOD CULTURE RECEIVED NO GROWTH TO DATE CULTURE WILL BE HELD FOR 5 DAYS BEFORE ISSUING A FINAL NEGATIVE REPORT   Report Status PENDING   Incomplete   CULTURE, BLOOD (ROUTINE X 2)     Status: Normal (Preliminary result)   Collection Time   07/30/11  9:00 PM      Component Value Range Status Comment   Specimen Description BLOOD LEFT ARM   Final    Special Requests BOTTLES DRAWN AEROBIC AND ANAEROBIC    Final    Culture  Setup Time 07/31/2011 01:22   Final    Culture     Final    Value:        BLOOD CULTURE RECEIVED NO GROWTH TO DATE CULTURE WILL BE HELD FOR 5 DAYS BEFORE ISSUING A FINAL NEGATIVE REPORT   Report Status PENDING   Incomplete      Studies:              All Imaging reviewed and is  as per above notation   Scheduled Meds:    . vancomycin  1,500 mg Intravenous Q12H   Continuous Infusions:    . sodium chloride 75 mL/hr at 08/01/11 0307     Assessment/Plan: 1. Left lower extremity swelling-duplex 7/27 rules out DVT.  Vancomycin transitioned to Keflex 500 twice a day, his white count has declined.  Potential discharge in the morning if this remains low and he has no fevers overnight-elevate lower extremity 2. Elevated bilirubin-resolved. 3. Morbid obesity, Body mass index is 45.16 kg/(m^2).-need dietary education counseling as an outpatient ? Sleep apnea-patient needs followup as an outpatient for this Benign prostatic hypertrophy-patient takes saw palmetto at home.  Start Flomax 0.4 mg each bedtime here Metabolic syndrome  Code Status: Full Family Communication: Discussed with family members in room, including wife.  All questions answered Disposition Plan: Home when cellulitis appears better   Pleas Koch, MD  [facility] Pager [phone] 08/01/2011, 2:31 PM    LOS: 2 days

## 2011-08-01 NOTE — Progress Notes (Signed)
Redness marked per MD request.

## 2011-08-02 LAB — CBC WITH DIFFERENTIAL/PLATELET
Basophils Absolute: 0.1 10*3/uL (ref 0.0–0.1)
Eosinophils Relative: 11 % — ABNORMAL HIGH (ref 0–5)
Lymphocytes Relative: 18 % (ref 12–46)
Neutro Abs: 3.7 10*3/uL (ref 1.7–7.7)
Neutrophils Relative %: 51 % (ref 43–77)
Platelets: 247 10*3/uL (ref 150–400)
RBC: 3.94 MIL/uL — ABNORMAL LOW (ref 4.22–5.81)
RDW: 13.6 % (ref 11.5–15.5)
WBC: 7.2 10*3/uL (ref 4.0–10.5)

## 2011-08-02 MED ORDER — TAMSULOSIN HCL 0.4 MG PO CAPS
0.8000 mg | ORAL_CAPSULE | Freq: Every day | ORAL | Status: DC
  Administered 2011-08-02: 0.8 mg via ORAL
  Filled 2011-08-02 (×2): qty 2

## 2011-08-02 NOTE — Progress Notes (Signed)
Utilization review completed.  

## 2011-08-02 NOTE — Progress Notes (Signed)
PROGRESS NOTE  Keith Watkins ZOX:[id number] DOB: 04/03/1945 DOA: 07/30/2011 PCP: No primary provider on file.  Brief narrative: 66 yr old Male admit 7/27 with LLE edema  Past medical history-As per Problem list Chart reviewed as below ?BPH Htn Obesity ?OSA  Consultants:  None so far  Procedures:  LLE Duplex 7/27=Negative for DVT  Antibiotics:  Vancomycin 7/27   Subjective  Feels well.  States had increasing lower extremity pain last night 2/2 to awakening Pain is worse usually when he gets up, but resolves after first 5-6 steps   Objective    Objective: Filed Vitals:   08/01/11 0513 08/01/11 1400 08/01/11 2200 08/02/11 0426  BP: 123/63 147/84 143/83 145/77  Pulse: 72 85 86 91  Temp: 97.9 F (36.6 C) 99.2 F (37.3 C) 98.5 F (36.9 C) 97.9 F (36.6 C)  TempSrc: Oral Oral Oral Oral  Resp: 18 18 18 18   Height:      Weight:      SpO2: 94% 97% 94% 94%    Intake/Output Summary (Last 24 hours) at 08/02/11 1117 Last data filed at 08/02/11 1026  Gross per 24 hour  Intake    780 ml  Output   1125 ml  Net   -345 ml    Exam:  General: Alert pleasant obese Caucasian male, no icterus or pallor, Mallampati stage III Cardiovascular: S1-S2 no murmur or gallop, no bruit Respiratory: Clinically clear no added sound Abdomen: Morbidly obese. Surgical scars noted Skin diffuse and impressive swelling left lower extremity circumferentially from mid tibia downwards with redness and blebs over medial malleolus--swelling has declined from demarcation. Neuro motor grossly intact as is sensation  Data Reviewed: Basic Metabolic Panel:  Lab 08/01/11 8119 07/31/11 0515 07/30/11 2040  NA 136 137 138  K 3.6 3.7 --  CL 103 103 102  CO2 25 28 26   GLUCOSE 100* 108* 109*  BUN 15 20 21   CREATININE 0.80 0.92 1.47  CALCIUM 8.3* 8.7 9.0  MG -- -- --  PHOS -- -- --   Liver Function Tests:  Lab 08/01/11 0413 07/30/11 2040  AST 14 18  ALT 11 13  ALKPHOS 72 96  BILITOT 0.8 1.4*   PROT 5.6* 7.2  ALBUMIN 2.1* 3.0*   No results found for this basename: LIPASE:5,AMYLASE:5 in the last 168 hours No results found for this basename: AMMONIA:5 in the last 168 hours CBC:  Lab 08/02/11 0402 08/01/11 0413 07/31/11 0515 07/30/11 2040  WBC 7.2 9.1 12.6* 14.6*  NEUTROABS 3.7 5.5 -- 11.8*  HGB 11.9* 11.1* 12.0* 13.9  HCT 35.2* 34.0* 36.5* 41.1  MCV 89.3 89.9 90.1 89.0  PLT 247 221 214 242   Cardiac Enzymes: No results found for this basename: CKTOTAL:5,CKMB:5,CKMBINDEX:5,TROPONINI:5 in the last 168 hours BNP: No components found with this basename: POCBNP:5 CBG: No results found for this basename: GLUCAP:5 in the last 168 hours  Recent Results (from the past 240 hour(s))  CULTURE, BLOOD (ROUTINE X 2)     Status: Normal (Preliminary result)   Collection Time   07/30/11  8:40 PM      Component Value Range Status Comment   Specimen Description BLOOD LEFT ARM   Final    Special Requests BOTTLES DRAWN AEROBIC AND ANAEROBIC    Final    Culture  Setup Time 07/31/2011 01:22   Final    Culture     Final    Value:        BLOOD CULTURE RECEIVED NO GROWTH TO DATE  CULTURE WILL BE HELD FOR 5 DAYS BEFORE ISSUING A FINAL NEGATIVE REPORT   Report Status PENDING   Incomplete   CULTURE, BLOOD (ROUTINE X 2)     Status: Normal (Preliminary result)   Collection Time   07/30/11  9:00 PM      Component Value Range Status Comment   Specimen Description BLOOD LEFT ARM   Final    Special Requests BOTTLES DRAWN AEROBIC AND ANAEROBIC    Final    Culture  Setup Time 07/31/2011 01:22   Final    Culture     Final    Value:        BLOOD CULTURE RECEIVED NO GROWTH TO DATE CULTURE WILL BE HELD FOR 5 DAYS BEFORE ISSUING A FINAL NEGATIVE REPORT   Report Status PENDING   Incomplete      Studies:              All Imaging reviewed and is as per above notation   Scheduled Meds:    . cephALEXin  500 mg Oral BID  . Tamsulosin HCl  0.8 mg Oral QHS  . DISCONTD: Tamsulosin HCl  0.4 mg Oral  Daily  . DISCONTD: Tamsulosin HCl  0.4 mg Oral QHS  . DISCONTD: vancomycin  1,500 mg Intravenous Q12H   Continuous Infusions:    . DISCONTD: sodium chloride 75 mL/hr at 08/01/11 0307     Assessment/Plan: 1. Left lower extremity swelling-duplex 7/27 rules out DVT.  Vancomycin transitioned to Keflex 500 twice a day, his white count has declined.  His pain is uncontrolled and we will need him to ambulate more prior to d/c home 2. Elevated bilirubin-resolved. 3. Morbid obesity, Body mass index is 45.16 kg/(m^2).-need dietary education counseling as an outpatient ? Sleep apnea-patient needs followup as an outpatient for this Benign prostatic hypertrophy-patient takes saw palmetto at home.  Started Flomax 0.4 mg->increased to 0.8 qhs Metabolic syndrome  Code Status: Full Family Communication: Discussed with family members in room, including wife.  All questions answered Disposition Plan: Home when cellulitis appears better   Pleas Koch, MD  [facility] Pager [phone] 08/02/2011, 11:17 AM    LOS: 3 days

## 2011-08-03 ENCOUNTER — Encounter: Payer: Self-pay | Admitting: Emergency Medicine

## 2011-08-03 LAB — CBC WITH DIFFERENTIAL/PLATELET
Basophils Absolute: 0.1 10*3/uL (ref 0.0–0.1)
HCT: 35.4 % — ABNORMAL LOW (ref 39.0–52.0)
Lymphocytes Relative: 18 % (ref 12–46)
Lymphs Abs: 1.2 10*3/uL (ref 0.7–4.0)
Monocytes Absolute: 1.3 10*3/uL — ABNORMAL HIGH (ref 0.1–1.0)
Neutro Abs: 3.2 10*3/uL (ref 1.7–7.7)
RBC: 3.96 MIL/uL — ABNORMAL LOW (ref 4.22–5.81)
RDW: 13.5 % (ref 11.5–15.5)
WBC: 6.6 10*3/uL (ref 4.0–10.5)

## 2011-08-03 MED ORDER — CEPHALEXIN 500 MG PO CAPS: 500.0000 mg | ORAL_CAPSULE | Freq: Two times a day (BID) | ORAL | Status: DC

## 2011-08-03 MED ORDER — TAMSULOSIN HCL 0.4 MG PO CAPS: 0.8000 mg | ORAL_CAPSULE | Freq: Every day | ORAL | Status: DC

## 2011-08-03 MED ORDER — IBUPROFEN 800 MG PO TABS: 800.0000 mg | ORAL_TABLET | Freq: Four times a day (QID) | ORAL | Status: DC | PRN

## 2011-08-03 MED ORDER — HYDROCODONE-ACETAMINOPHEN 5-325 MG PO TABS: 1.0000 | ORAL_TABLET | ORAL | Status: DC | PRN

## 2011-08-03 NOTE — Discharge Summary (Signed)
Physician Discharge Summary  Keith Watkins ZOX:[id number] DOB: Dec 26, 1945 DOA: 07/30/2011  PCP: No primary provider on file.  Admit date: 07/30/2011 Discharge date: 08/03/2011  Recommendations for Outpatient Follow-up:  1. Follow up with Dr. Quintella Reichert for wound check 2. Needs PCP to follow up-?OSA/Metabolic syndrome  Discharge Diagnoses:  Principal Problem:  *Cellulitis   Discharge Condition: Good  Diet recommendation: regular  Wt Readings from Last 3 Encounters:  07/30/11 151.048 kg (333 lb)    History of present illness:  Pt is 66 yo male who presents to [facility] with main concern of progressively worsening left lower extremity swelling and redness that started few days ago but he reports have an injury to that leg about one month ago and has done rather well. Approximately 4 days ago he flew to [place] and when he came back he noticed the above symptoms. He also describes it as painful, pressure like pain, constant and 7/10 in severity, worse with movement and better with rest. He was ruled out for a LLE DVT and was empirically Rx for Cellulitis and made progress.    Hospital Course:   1. Left lower extremity swelling-duplex 7/27 rules out DVT. Vancomycin transitioned to Keflex 500 twice a day, his white count has declined, and he made good progress over the past 2 days.   He did ambulate the entire [address] way with minimal pain and was able to tolerate the same.  He had an open bleb on the forfoot  Which was dressed w Wet--> dry dressings and he will need to keep the area elevated as well as have a follow-up @ Dr. Earmon [place] office. 2. Elevated bilirubin-resolved. 3. Morbid obesity, Body mass index is 45.16 kg/(m^2).-need dietary education counseling as an outpatient 5. ? Sleep apnea-patient needs followup as an outpatient for this- 6. Benign prostatic hypertrophy-patient takes saw palmetto at home. Start Flomax 0.4 mg each bedtime here 7. Metabolic syndrome  Procedures:  LLE Duplex 7/27=Negative  for DVT  Antibiotics:  Vancomycin 7/26-7/27 Keflex 500 bid to complete on 8/10  Discharge Exam: Filed Vitals:   08/03/11 0600  BP:   Pulse:   Temp: 98.7 F (37.1 C)  Resp:    Filed Vitals:   08/02/11 0426 08/02/11 1400 08/02/11 2216 08/03/11 0600  BP: 145/77 119/73 159/90   Pulse: 91 83 93   Temp: 97.9 F (36.6 C) 98.2 F (36.8 C) 99 F (37.2 C) 98.7 F (37.1 C)  TempSrc: Oral Oral Oral   Resp: 18 20 18    Height:      Weight:      SpO2: 94% 95% 96%    General: Alert pleasant obese Caucasian male, no icterus or pallor, Mallampati stage III  Cardiovascular: S1-S2 no murmur or gallop, no bruit  Respiratory: Clinically clear no added sound  Abdomen: Morbidly obese. Surgical scars noted  Skin diffuse and impressive swelling left lower extremity circumferentially from mid tibia downwards with redness and blebs over medial malleolus--swelling has declined from demarcation. Area of protuberant scar.  No odour. Neuro motor grossly intact as is sensation  Discharge Instructions  Discharge Orders    Future Orders Please Complete By Expires   Diet - low sodium heart healthy      Increase activity slowly      Call MD for:  temperature >100.4      Call MD for:      Call MD for:  persistant nausea and vomiting      Call MD for:  severe uncontrolled pain  Call MD for:  difficulty breathing, headache or visual disturbances      Call MD for:  persistant dizziness or light-headedness        Medication List  As of 08/03/2011 10:14 AM   STOP taking these medications         HYDROcodone-acetaminophen 5-500 MG per tablet      indomethacin 25 MG capsule      saw palmetto 80 MG capsule         TAKE these medications         cephALEXin 500 MG capsule   Commonly known as: KEFLEX   Take 1 capsule (500 mg total) by mouth 2 (two) times daily.      glucosamine-chondroitin 500-400 MG tablet   Take 1 tablet by mouth 2 (two) times daily.      HYDROcodone-acetaminophen 5-325 MG  per tablet   Commonly known as: NORCO/VICODIN   Take 1-2 tablets by mouth every 4 (four) hours as needed.      ibuprofen 800 MG tablet   Commonly known as: ADVIL,MOTRIN   Take 1 tablet (800 mg total) by mouth every 6 (six) hours as needed.      Tamsulosin HCl 0.4 MG Caps   Commonly known as: FLOMAX   Take 2 capsules (0.8 mg total) by mouth at bedtime.           Follow-up Information    Follow up with HOOPER,JEFFREY C, MD. Schedule an appointment as soon as possible for a visit in 5 days.   Contact information:   [address] [place] [address] [phone]           The results of significant diagnostics from this hospitalization (including imaging, microbiology, ancillary and laboratory) are listed below for reference.    Significant Diagnostic Studies: No results found.  Microbiology: Recent Results (from the past 240 hour(s))  CULTURE, BLOOD (ROUTINE X 2)     Status: Normal (Preliminary result)   Collection Time   07/30/11  8:40 PM      Component Value Range Status Comment   Specimen Description BLOOD LEFT ARM   Final    Special Requests BOTTLES DRAWN AEROBIC AND ANAEROBIC    Final    Culture  Setup Time 07/31/2011 01:22   Final    Culture     Final    Value:        BLOOD CULTURE RECEIVED NO GROWTH TO DATE CULTURE WILL BE HELD FOR 5 DAYS BEFORE ISSUING A FINAL NEGATIVE REPORT   Report Status PENDING   Incomplete   CULTURE, BLOOD (ROUTINE X 2)     Status: Normal (Preliminary result)   Collection Time   07/30/11  9:00 PM      Component Value Range Status Comment   Specimen Description BLOOD LEFT ARM   Final    Special Requests BOTTLES DRAWN AEROBIC AND ANAEROBIC    Final    Culture  Setup Time 07/31/2011 01:22   Final    Culture     Final    Value:        BLOOD CULTURE RECEIVED NO GROWTH TO DATE CULTURE WILL BE HELD FOR 5 DAYS BEFORE ISSUING A FINAL NEGATIVE REPORT   Report Status PENDING   Incomplete      Labs: Basic Metabolic  Panel:  Lab 08/01/11 0413 07/31/11 0515 07/30/11 2040  NA 136 137 138  K 3.6 3.7 3.6  CL 103 103 102  CO2 25 28 26  GLUCOSE 100* 108* 109*  BUN 15 20 21   CREATININE 0.80 0.92 0.93  CALCIUM 8.3* 8.7 9.0  MG -- -- --  PHOS -- -- --   Liver Function Tests:  Lab 08/01/11 0413 07/30/11 2040  AST 14 18  ALT 11 13  ALKPHOS 72 96  BILITOT 0.8 1.4*  PROT 5.6* 7.2  ALBUMIN 2.1* 3.0*   No results found for this basename: LIPASE:5,AMYLASE:5 in the last 168 hours No results found for this basename: AMMONIA:5 in the last 168 hours CBC:  Lab 08/03/11 0400 08/02/11 0402 08/01/11 0413 07/31/11 0515 07/30/11 2040  WBC 6.6 7.2 9.1 12.6* 14.6*  NEUTROABS 3.2 3.7 5.5 -- 11.8*  HGB 11.7* 11.9* 11.1* 12.0* 13.9  HCT 35.4* 35.2* 34.0* 36.5* 41.1  MCV 89.4 89.3 89.9 90.1 89.0  PLT 279 247 221 214 242   Cardiac Enzymes: No results found for this basename: CKTOTAL:5,CKMB:5,CKMBINDEX:5,TROPONINI:5 in the last 168 hours BNP: BNP (last 3 results) No results found for this basename: PROBNP:3 in the last 8760 hours CBG: No results found for this basename: GLUCAP:5 in the last 168 hours  Time coordinating discharge: 45 minutes  Signed:  Rhetta Mura  [facility] 08/03/2011, 10:01 AM

## 2011-08-03 NOTE — Progress Notes (Signed)
Showed pt and family how to change drsg to left foot.  Replaced drsg with a wet to dry drsg.  Pt stated his daughter is a  Surgical nurse and would help with the drsg changes.  Pt and family instructed to change drsg every other day and keep dry.  Pt and family verbalized understanding.

## 2011-08-03 NOTE — Progress Notes (Signed)
Spoke with pt and pt's wife concerning PCP. Pt will follow up with Worker's Comp/[facility] in [place]. After pt has completed worker's Comp he will go to [facility]. mp

## 2011-08-03 NOTE — Progress Notes (Signed)
Discharge summary sent to payer through [facility]  

## 2011-08-06 LAB — CULTURE, BLOOD (ROUTINE X 2)

## 2011-08-07 ENCOUNTER — Encounter (HOSPITAL_COMMUNITY): Payer: Self-pay | Admitting: Anesthesiology

## 2011-08-07 ENCOUNTER — Encounter (HOSPITAL_COMMUNITY): Payer: Self-pay | Admitting: *Deleted

## 2011-08-07 ENCOUNTER — Encounter (HOSPITAL_COMMUNITY)
Admission: AD | Disposition: A | Discharge status: Home Health | Payer: Self-pay | Source: Ambulatory Visit | Attending: Internal Medicine

## 2011-08-07 ENCOUNTER — Telehealth: Payer: Self-pay | Admitting: Internal Medicine

## 2011-08-07 ENCOUNTER — Inpatient Hospital Stay (HOSPITAL_COMMUNITY)
Admission: AD | Admit: 2011-08-07 | Discharge: 2011-08-11 | DRG: 571 | Disposition: A | Discharge status: Home Health | Payer: Worker's Compensation | Source: Ambulatory Visit | Attending: Internal Medicine | Admitting: Internal Medicine

## 2011-08-07 ENCOUNTER — Inpatient Hospital Stay (HOSPITAL_COMMUNITY): Payer: Worker's Compensation

## 2011-08-07 ENCOUNTER — Inpatient Hospital Stay (HOSPITAL_COMMUNITY): Payer: Worker's Compensation | Admitting: Anesthesiology

## 2011-08-07 DIAGNOSIS — L02612 Cutaneous abscess of left foot: Secondary | ICD-10-CM

## 2011-08-07 DIAGNOSIS — L03119 Cellulitis of unspecified part of limb: Principal | ICD-10-CM | POA: Diagnosis present

## 2011-08-07 DIAGNOSIS — I10 Essential (primary) hypertension: Secondary | ICD-10-CM | POA: Diagnosis present

## 2011-08-07 DIAGNOSIS — N401 Enlarged prostate with lower urinary tract symptoms: Secondary | ICD-10-CM | POA: Diagnosis present

## 2011-08-07 DIAGNOSIS — L0390 Cellulitis, unspecified: Secondary | ICD-10-CM

## 2011-08-07 DIAGNOSIS — Z79899 Other long term (current) drug therapy: Secondary | ICD-10-CM

## 2011-08-07 DIAGNOSIS — N400 Enlarged prostate without lower urinary tract symptoms: Secondary | ICD-10-CM

## 2011-08-07 DIAGNOSIS — E669 Obesity, unspecified: Secondary | ICD-10-CM | POA: Diagnosis present

## 2011-08-07 DIAGNOSIS — E8881 Metabolic syndrome: Secondary | ICD-10-CM | POA: Diagnosis present

## 2011-08-07 DIAGNOSIS — L02619 Cutaneous abscess of unspecified foot: Principal | ICD-10-CM | POA: Diagnosis present

## 2011-08-07 DIAGNOSIS — N3941 Urge incontinence: Secondary | ICD-10-CM | POA: Diagnosis present

## 2011-08-07 DIAGNOSIS — Z6841 Body Mass Index (BMI) 40.0 and over, adult: Secondary | ICD-10-CM

## 2011-08-07 HISTORY — PX: I & D EXTREMITY: SHX5045

## 2011-08-07 HISTORY — DX: Cutaneous abscess of left foot: L02.612

## 2011-08-07 LAB — CBC
MCH: 29.8 pg (ref 26.0–34.0)
MCV: 89.8 fL (ref 78.0–100.0)
Platelets: 372 10*3/uL (ref 150–400)
RDW: 13.5 % (ref 11.5–15.5)
WBC: 5.9 10*3/uL (ref 4.0–10.5)

## 2011-08-07 LAB — HEMOGLOBIN A1C: Hgb A1c MFr Bld: 5.6 % (ref <5.7)

## 2011-08-07 LAB — BASIC METABOLIC PANEL
Calcium: 9.2 mg/dL (ref 8.4–10.5)
Chloride: 98 mEq/L (ref 96–112)
Creatinine, Ser: 0.86 mg/dL (ref 0.50–1.35)
GFR calc Af Amer: >90 mL/min (ref >90)

## 2011-08-07 SURGERY — IRRIGATION AND DEBRIDEMENT EXTREMITY
Anesthesia: General | Site: Foot | Laterality: Left | Wound class: Dirty or Infected

## 2011-08-07 MED ORDER — FENTANYL CITRATE 0.05 MG/ML IJ SOLN
INTRAMUSCULAR | Status: DC | PRN
  Administered 2011-08-07 (×4): 50 ug via INTRAVENOUS

## 2011-08-07 MED ORDER — LACTATED RINGERS IV SOLN: INTRAVENOUS | Status: DC

## 2011-08-07 MED ORDER — TAMSULOSIN HCL 0.4 MG PO CAPS
0.8000 mg | ORAL_CAPSULE | Freq: Every day | ORAL | Status: DC
  Administered 2011-08-07 – 2011-08-10 (×4): 0.8 mg via ORAL
  Filled 2011-08-07 (×6): qty 2

## 2011-08-07 MED ORDER — 0.9 % SODIUM CHLORIDE (POUR BTL) OPTIME
TOPICAL | Status: DC | PRN
  Administered 2011-08-07: 1000 mL

## 2011-08-07 MED ORDER — LIDOCAINE HCL (CARDIAC) 20 MG/ML IV SOLN
INTRAVENOUS | Status: DC | PRN
  Administered 2011-08-07: 100 mg via INTRAVENOUS

## 2011-08-07 MED ORDER — SENNA 8.6 MG PO TABS
1.0000 | ORAL_TABLET | Freq: Two times a day (BID) | ORAL | Status: DC
  Administered 2011-08-07 – 2011-08-11 (×8): 8.6 mg via ORAL
  Filled 2011-08-07 (×9): qty 1

## 2011-08-07 MED ORDER — LACTATED RINGERS IV SOLN
INTRAVENOUS | Status: DC | PRN
  Administered 2011-08-07: 20:00:00 via INTRAVENOUS

## 2011-08-07 MED ORDER — ACETAMINOPHEN 10 MG/ML IV SOLN
INTRAVENOUS | Status: DC | PRN
  Administered 2011-08-07: 1000 mg via INTRAVENOUS

## 2011-08-07 MED ORDER — ONDANSETRON HCL 4 MG/2ML IJ SOLN
INTRAMUSCULAR | Status: DC | PRN
  Administered 2011-08-07: 4 mg via INTRAVENOUS

## 2011-08-07 MED ORDER — METOCLOPRAMIDE HCL 10 MG PO TABS: 5.0000 mg | ORAL_TABLET | Freq: Three times a day (TID) | ORAL | Status: DC | PRN

## 2011-08-07 MED ORDER — MIDAZOLAM HCL 5 MG/5ML IJ SOLN
INTRAMUSCULAR | Status: DC | PRN
  Administered 2011-08-07: 2 mg via INTRAVENOUS

## 2011-08-07 MED ORDER — SODIUM CHLORIDE 0.9 % IV SOLN
INTRAVENOUS | Status: DC | PRN
  Administered 2011-08-07: 19:00:00 via INTRAVENOUS

## 2011-08-07 MED ORDER — MEPERIDINE HCL 50 MG/ML IJ SOLN: 6.2500 mg | INTRAMUSCULAR | Status: DC | PRN

## 2011-08-07 MED ORDER — SODIUM CHLORIDE 0.9 % IV SOLN: INTRAVENOUS | Status: DC

## 2011-08-07 MED ORDER — ACETAMINOPHEN 650 MG RE SUPP: 650.0000 mg | Freq: Four times a day (QID) | RECTAL | Status: DC | PRN

## 2011-08-07 MED ORDER — ONDANSETRON HCL 4 MG/2ML IJ SOLN
4.0000 mg | Freq: Four times a day (QID) | INTRAMUSCULAR | Status: DC | PRN
  Administered 2011-08-08: 4 mg via INTRAVENOUS
  Filled 2011-08-07: qty 2

## 2011-08-07 MED ORDER — ONDANSETRON HCL 4 MG PO TABS: 4.0000 mg | ORAL_TABLET | Freq: Four times a day (QID) | ORAL | Status: DC | PRN

## 2011-08-07 MED ORDER — HYDROCODONE-ACETAMINOPHEN 5-325 MG PO TABS
1.0000 | ORAL_TABLET | ORAL | Status: DC | PRN
  Administered 2011-08-08 – 2011-08-10 (×3): 2 via ORAL
  Filled 2011-08-07 (×3): qty 2

## 2011-08-07 MED ORDER — DOCUSATE SODIUM 100 MG PO CAPS
100.0000 mg | ORAL_CAPSULE | Freq: Two times a day (BID) | ORAL | Status: DC
  Administered 2011-08-07 – 2011-08-11 (×8): 100 mg via ORAL
  Filled 2011-08-07 (×9): qty 1

## 2011-08-07 MED ORDER — HYDROMORPHONE HCL PF 1 MG/ML IJ SOLN
1.0000 mg | INTRAMUSCULAR | Status: DC | PRN
  Administered 2011-08-07 – 2011-08-08 (×2): 1 mg via INTRAVENOUS
  Filled 2011-08-07 (×2): qty 1

## 2011-08-07 MED ORDER — ONDANSETRON HCL 4 MG/2ML IJ SOLN: 4.0000 mg | Freq: Four times a day (QID) | INTRAMUSCULAR | Status: DC | PRN

## 2011-08-07 MED ORDER — VANCOMYCIN HCL 1000 MG IV SOLR
1500.0000 mg | Freq: Two times a day (BID) | INTRAVENOUS | Status: DC
  Administered 2011-08-08 – 2011-08-09 (×4): 1500 mg via INTRAVENOUS
  Filled 2011-08-07 (×5): qty 1500

## 2011-08-07 MED ORDER — PROPOFOL 10 MG/ML IV EMUL
INTRAVENOUS | Status: DC | PRN
  Administered 2011-08-07: 200 mg via INTRAVENOUS

## 2011-08-07 MED ORDER — PROMETHAZINE HCL 25 MG/ML IJ SOLN: 6.2500 mg | INTRAMUSCULAR | Status: DC | PRN

## 2011-08-07 MED ORDER — METOCLOPRAMIDE HCL 5 MG/ML IJ SOLN: 5.0000 mg | Freq: Three times a day (TID) | INTRAMUSCULAR | Status: DC | PRN

## 2011-08-07 MED ORDER — VANCOMYCIN HCL 1000 MG IV SOLR
2000.0000 mg | Freq: Once | INTRAVENOUS | Status: AC
  Administered 2011-08-07: 2000 mg via INTRAVENOUS
  Filled 2011-08-07: qty 2000

## 2011-08-07 MED ORDER — SODIUM CHLORIDE 0.9 % IV SOLN
INTRAVENOUS | Status: DC
  Administered 2011-08-07: 21:00:00 via INTRAVENOUS
  Administered 2011-08-08: 100 mL/h via INTRAVENOUS
  Administered 2011-08-09 – 2011-08-11 (×3): via INTRAVENOUS

## 2011-08-07 MED ORDER — HYDROMORPHONE HCL PF 1 MG/ML IJ SOLN
0.2500 mg | INTRAMUSCULAR | Status: DC | PRN
  Administered 2011-08-07 (×4): 0.5 mg via INTRAVENOUS

## 2011-08-07 MED ORDER — SODIUM CHLORIDE 0.9 % IR SOLN
Status: DC | PRN
  Administered 2011-08-07: 3000 mL

## 2011-08-07 MED ORDER — ACETAMINOPHEN 325 MG PO TABS: 650.0000 mg | ORAL_TABLET | Freq: Four times a day (QID) | ORAL | Status: DC | PRN

## 2011-08-07 SURGICAL SUPPLY — 49 items
ANCHOR SUT KEITH ABD SZ2 STR (Anchor) ×2 IMPLANT
BAG ZIPLOCK 12X15 (MISCELLANEOUS) ×2 IMPLANT
BANDAGE ELASTIC 4 VELCRO ST LF (GAUZE/BANDAGES/DRESSINGS) ×2 IMPLANT
BANDAGE ELASTIC 6 VELCRO ST LF (GAUZE/BANDAGES/DRESSINGS) IMPLANT
BANDAGE GAUZE ELAST BULKY 4 IN (GAUZE/BANDAGES/DRESSINGS) ×2 IMPLANT
CLOTH BEACON ORANGE TIMEOUT ST (SAFETY) ×2 IMPLANT
CUFF TOURN SGL QUICK 34 (TOURNIQUET CUFF)
CUFF TRNQT CYL 34X4X40X1 (TOURNIQUET CUFF) IMPLANT
DECANTER SPIKE VIAL GLASS SM (MISCELLANEOUS) IMPLANT
DRAPE C-ARM 42X72 X-RAY (DRAPES) IMPLANT
DRAPE OEC MINIVIEW 54X84 (DRAPES) IMPLANT
DRAPE U-SHAPE 47X51 STRL (DRAPES) ×2 IMPLANT
DRSG ADAPTIC 3X8 NADH LF (GAUZE/BANDAGES/DRESSINGS) IMPLANT
DRSG PAD ABDOMINAL 8X10 ST (GAUZE/BANDAGES/DRESSINGS) ×2 IMPLANT
DURAPREP 26ML APPLICATOR (WOUND CARE) IMPLANT
ELECT REM PT RETURN 9FT ADLT (ELECTROSURGICAL) ×2
ELECTRODE REM PT RTRN 9FT ADLT (ELECTROSURGICAL) ×1 IMPLANT
FACESHIELD LNG OPTICON STERILE (SAFETY) IMPLANT
GAUZE PACKING IODOFORM 2 (PACKING) ×2 IMPLANT
GLOVE BIO SURGEON STRL SZ7.5 (GLOVE) ×2 IMPLANT
GLOVE ECLIPSE 8.0 STRL XLNG CF (GLOVE) ×4 IMPLANT
GLOVE ORTHO TXT STRL SZ7.5 (GLOVE) ×2 IMPLANT
GLOVE SURG ORTHO 7.0 STRL STRW (GLOVE) ×2 IMPLANT
GLOVE SURG ORTHO 8.5 STRL (GLOVE) ×2 IMPLANT
HOVERMATT SINGLE USE (MISCELLANEOUS) ×2 IMPLANT
KIT BASIN OR (CUSTOM PROCEDURE TRAY) ×2 IMPLANT
MANIFOLD NEPTUNE II (INSTRUMENTS) IMPLANT
NEEDLE HYPO 22GX1.5 SAFETY (NEEDLE) IMPLANT
NEEDLE MAYO .5 CIRCLE (NEEDLE) IMPLANT
NS IRRIG 1000ML POUR BTL (IV SOLUTION) ×2 IMPLANT
PACK LOWER EXTREMITY WL (CUSTOM PROCEDURE TRAY) ×2 IMPLANT
PAD CAST 4YDX4 CTTN HI CHSV (CAST SUPPLIES) ×1 IMPLANT
PADDING CAST COTTON 4X4 STRL (CAST SUPPLIES) ×1
POSITIONER SURGICAL ARM (MISCELLANEOUS) IMPLANT
SPONGE GAUZE 4X4 12PLY (GAUZE/BANDAGES/DRESSINGS) IMPLANT
SPONGE LAP 4X18 X RAY DECT (DISPOSABLE) IMPLANT
STAPLER VISISTAT 35W (STAPLE) IMPLANT
STRIP CLOSURE SKIN 1/2X4 (GAUZE/BANDAGES/DRESSINGS) IMPLANT
SUCTION FRAZIER TIP 10 FR DISP (SUCTIONS) IMPLANT
SUT ETHIBOND NAB BRD #0 18IN (SUTURE) IMPLANT
SUT ETHILON 2 0 PS N (SUTURE) ×2 IMPLANT
SUT ETHILON 4 0 PS 2 18 (SUTURE) IMPLANT
SUT PROLENE 3 0 PS 2 (SUTURE) IMPLANT
SUT VIC AB 2-0 CT1 27 (SUTURE)
SUT VIC AB 2-0 CT1 TAPERPNT 27 (SUTURE) IMPLANT
SUT VIC AB 3-0 PS2 18 (SUTURE)
SUT VIC AB 3-0 PS2 18XBRD (SUTURE) IMPLANT
SYR CONTROL 10ML LL (SYRINGE) IMPLANT
WATER STERILE IRR 1500ML POUR (IV SOLUTION) ×2 IMPLANT

## 2011-08-07 NOTE — Anesthesia Postprocedure Evaluation (Signed)
  Anesthesia Post-op Note  Patient: Keith Watkins  Procedure(s) Performed: Procedure(s) (LRB): IRRIGATION AND DEBRIDEMENT EXTREMITY (Left)  Patient Location: PACU  Anesthesia Type: General  Level of Consciousness: awake and alert   Airway and Oxygen Therapy: Patient Spontanous Breathing  Post-op Pain: mild  Post-op Assessment: Post-op Vital signs reviewed, Patient's Cardiovascular Status Stable, Respiratory Function Stable, Patent Airway and No signs of Nausea or vomiting  Post-op Vital Signs: stable  Complications: No apparent anesthesia complications

## 2011-08-07 NOTE — Anesthesia Preprocedure Evaluation (Addendum)
Anesthesia Evaluation  Patient identified by MRN, date of birth, ID band Patient awake    Reviewed: Allergy & Precautions, H&P , NPO status , Patient's Chart, lab work & pertinent test results  Airway Mallampati: II TM Distance: >3 FB Neck ROM: Full    Dental No notable dental hx.    Pulmonary neg pulmonary ROS,  breath sounds clear to auscultation  Pulmonary exam normal       Cardiovascular Rhythm:Regular Rate:Normal     Neuro/Psych negative neurological ROS  negative psych ROS   GI/Hepatic negative GI ROS, Neg liver ROS, hiatal hernia,   Endo/Other  Morbid obesity  Renal/GU negative Renal ROS  negative genitourinary   Musculoskeletal negative musculoskeletal ROS (+)   Abdominal   Peds negative pediatric ROS (+)  Hematology negative hematology ROS (+)   Anesthesia Other Findings   Reproductive/Obstetrics negative OB ROS                          Anesthesia Physical Anesthesia Plan  ASA: III  Anesthesia Plan: General   Post-op Pain Management:    Induction: Intravenous  Airway Management Planned: LMA  Additional Equipment:   Intra-op Plan:   Post-operative Plan: Extubation in OR  Informed Consent: I have reviewed the patients History and Physical, chart, labs and discussed the procedure including the risks, benefits and alternatives for the proposed anesthesia with the patient or authorized representative who has indicated his/her understanding and acceptance.   Dental advisory given  Plan Discussed with: CRNA  Anesthesia Plan Comments:         Anesthesia Quick Evaluation

## 2011-08-07 NOTE — Op Note (Signed)
Keith Watkins, Keith Watkins NO.:  [id number]  MEDICAL RECORD NO.:  [id number]  LOCATION:  1306                         FACILITY:  [facility]  PHYSICIAN:  Toni Arthurs, MD        DATE OF BIRTH:  06/05/1945  DATE OF PROCEDURE:  08/07/2011 DATE OF DISCHARGE:                              OPERATIVE REPORT   PREOPERATIVE DIAGNOSIS:  Left dorsal foot abscess.  POSTOPERATIVE DIAGNOSIS:  Left dorsal foot abscess.  PROCEDURE:  Irrigation and excisional debridement of left dorsal foot abscess deep to the fascia in multiple compartments.  SURGEON:  Toni Arthurs, MD.  ANESTHESIA:  General.  ESTIMATED BLOOD LOSS:  Minimal.  TOURNIQUET TIME:  Zero.  COMPLICATIONS:  None apparent.  SPECIMENS:  Deep tissue to Microbiology for aerobic and anaerobic culture.  DISPOSITION:  Extubated, awake, and stable to recovery.  INDICATIONS FOR PROCEDURE:  The patient is a 66 year old male who had an object fall on his foot at work approximately a month ago.  He said this initially was a bad bruise that resolved.  He then developed significant swelling after a plane flight.  He presented to the emergency department and was treated for cellulitis.  He was discharged earlier this week on oral antibiotics.  He noticed a wound that started draining.  He was readmitted today for a draining wound on the dorsum of the foot.  He presents now for I and D of what appears to be a dorsal foot abscess. He understands the risks and benefits, alternative treatment options, and elects surgical treatment.  He specifically understands risks of bleeding, infection, nerve damage, blood clots, need for additional surgery, amputation, and death.  PROCEDURE IN DETAIL:  After preoperative consent was obtained and the correct operative site was identified, the patient was brought to the operating room and placed supine on the operating table.  General anesthesia was induced. The patient was already on antibiotics,  that were therapeutic.  A surgical time-out was taken.  The left lower extremity was prepped and draped in standard sterile fashion.  The patient's dorsal foot wound was identified.  It was an approximately 2 cm in diameter round wound, that was full-thickness through the skin down to subcutaneous tissue, there was necrotic material evident in the central portion of the wound.  The wound was extended proximally and distally down through the skin to the level of the subcutaneous tissue. Immediately evident was necrotic and purulent material.  This was expressed out of the wound and a specimen of this material was sent to Microbiology for culture.  The wound was then excisionally debrided circumferentially from the level of the skin down through the subcutaneous tissue into the interosseous spaces between the metatarsal shafts.  All necrotic material was sharply excised with a knife, rongeur, and scissors.  The wound was then irrigated with 3 L of normal saline.  Again the wound was examined and all necrotic material was excised sharply.  At this point, only healthy viable tissue remained. The proximal and distal extents of the wound were closed with 2 simple sutures, leaving the central portion of the wound open.  The wound was then packed with Iodoform  gauze.  Sterile dressings were applied, followed by a compression wrap.  The patient was then awake from anesthesia and transported to the recovery room  in stable condition.  FOLLOWUP PLAN:  The patient will be weightbearing as tolerated on his left foot in a hard-sole shoe.  He will have IV antibiotics and may need a return to the operating room for repeat I and D later in the week.     Toni Arthurs, MD     JH/MEDQ  D:  08/07/2011  T:  08/07/2011  Job:  [id number]

## 2011-08-07 NOTE — H&P (Signed)
PCP: [facility]  No primary provider on file.   Chief Complaint:  L foot with swelling and draining wound  HPI: Keith Watkins is a 66 year old male was discharged from [facility] 6 days ago, he had apparently injured his leg a month ago at work and did well until 7/26 when he noted pain swelling and redness of the entire left leg , he was treated for cellulitis originally with vancomycin, had a good clinical improvement and was discharged home on Keflex. Since then, patient and daughter had noticed an area of pointing on the dorsum of his foot which was initially weeping and subsequently started draining blood-tinged purulent discharge. He went to urgent care yesterday, had some pus drained from it and was started on ciprofloxacin, went back today for reevaluation and was sent to [facility] hospital for further evaluation and management.   Allergies:  No Known Allergies    Past Medical History  Diagnosis Date  . Back pain   . Arthritis   . Hypertension   . Enlarged prostate     Past Surgical History  Procedure Date  . Stomach stapled   . Cholecystectomy   . Appendectomy   . Arthroscopic knee surgery     Prior to Admission medications   Medication Sig Start Date End Date Taking? Authorizing Provider  cephALEXin (KEFLEX) 500 MG capsule Take 500 mg by mouth 2 (two) times daily. 08/03/11 08/13/11 Yes Rhetta Mura, MD  ciprofloxacin (CIPRO) 500 MG tablet Take 500 mg by mouth 2 (two) times daily.   Yes Historical Provider, MD  glucosamine-chondroitin 500-400 MG tablet Take 1 tablet by mouth 2 (two) times daily.   Yes Historical Provider, MD  HYDROcodone-acetaminophen (NORCO/VICODIN) 5-325 MG per tablet Take 1-2 tablets by mouth every 4 (four) hours as needed. Pain 08/03/11 08/13/11 Yes Rhetta Mura, MD  ibuprofen (ADVIL,MOTRIN) 800 MG tablet Take 800 mg by mouth every 6 (six) hours as needed. 08/03/11 08/13/11 Yes Rhetta Mura, MD  Tamsulosin HCl (FLOMAX)  0.4 MG CAPS Take 0.8 mg by mouth at bedtime. 08/03/11  Yes Rhetta Mura, MD    Social History:  reports that he has never smoked. He has never used smokeless tobacco. He reports that he does not drink alcohol or use illicit drugs.  Family History  Problem Relation Age of Onset  . Diabetes Other   . Stroke Other   . Hypertension Other   . Coronary artery disease Other   . Heart failure Other     Review of Systems:  Constitutional: Denies fever, chills, diaphoresis, appetite change and fatigue.  HEENT: Denies photophobia, eye pain, redness, hearing loss, ear pain, congestion, sore throat, rhinorrhea, sneezing, mouth sores, trouble swallowing, neck pain, neck stiffness and tinnitus.   Respiratory: Denies SOB, DOE, cough, chest tightness,  and wheezing.   Cardiovascular: Denies chest pain, palpitations and leg swelling.  Gastrointestinal: Denies nausea, vomiting, abdominal pain, diarrhea, constipation, blood in stool and abdominal distention.  Genitourinary: Denies dysuria, urgency, frequency, hematuria, flank pain and difficulty urinating.  Musculoskeletal: Denies myalgias, back pain, joint swelling, arthralgias and gait problem.  Skin: Denies pallor, rash and wound.  Neurological: Denies dizziness, seizures, syncope, weakness, light-headedness, numbness and headaches.  Hematological: Denies adenopathy. Easy bruising, personal or family bleeding history  Psychiatric/Behavioral: Denies suicidal ideation, mood changes, confusion, nervousness, sleep disturbance and agitation   Physical Exam: There were no vitals taken for this visit. General exam alert awake obese oriented x3 no rest HEENT oral mucosa moist and pink neck  no JVD or lymphadenopathy CVS S1-S2 regular rate rhythm no murmurs rubs or gallops Abdomen obese soft nontender with normal bowel sounds organomegaly Extremities left foot dorsum with swelling, an area of erythema, with central post pointing and draining  seropurulent discharge. Neuro moves all extremities no localizing signs Musculoskeletal no evidence of joint deformities or synovitis Psychiatric appropriate mood and affect Labs on Admission:  No results found for this or any previous visit (from the past 48 hour(s)).  Radiological Exams on Admission: No results found.  Assessment/Plan 1. Foot abscess, left Will check x-rays of left foot IV Vancomycin I have consulted Dr. Victorino Dike with [facility] orthopedics for incision and debridement Wound cultures after debridement Check hemoglobin A1c, borderline diabetes/ metabolic syndrome suspected  2. history of hypertension stable  3. history of BPH continue Flomax  4. Obesity: Lifestyle modification  5. DVT prophylaxis: Await I&D   Time Spent on Admission:  Treyon Wymore [facility] Pager: [phone]  08/07/2011, 2:55 PM

## 2011-08-07 NOTE — Telephone Encounter (Signed)
Recently discharged, seen at urgent care concerning for worsening wound, abscess needs debridement. Accepted to med-surg bed as direct admit

## 2011-08-07 NOTE — Transfer of Care (Signed)
Immediate Anesthesia Transfer of Care Note  Patient: Keith Watkins  Procedure(s) Performed: Procedure(s) (LRB): IRRIGATION AND DEBRIDEMENT EXTREMITY (Left)  Patient Location: PACU  Anesthesia Type: General  Level of Consciousness: sedated, patient cooperative and responds to stimulaton  Airway & Oxygen Therapy: Patient Spontanous Breathing and Patient connected to face mask oxgen  Post-op Assessment: Report given to PACU RN and Post -op Vital signs reviewed and stable  Post vital signs: Reviewed and stable  Complications: No apparent anesthesia complications

## 2011-08-07 NOTE — Brief Op Note (Signed)
08/07/2011  8:08 PM  PATIENT:  Keith Watkins  66 y.o. male  PRE-OPERATIVE DIAGNOSIS:  Left dorsal foot abcess  POST-OPERATIVE DIAGNOSIS:  same  Procedure(s): Irrigation and excisional debridement of left dorsal foot abscess deep to the fascia in multiple compartments  SURGEON:  Toni Arthurs, MD  ASSISTANT: n/a  ANESTHESIA:   General  EBL:  minimal   TOURNIQUET:  n/a  COMPLICATIONS:  None apparent  Specimens:  Deep tissue to micro for culture  DISPOSITION:  Extubated, awake and stable to recovery.  DICTATION ID:  [id number]

## 2011-08-07 NOTE — Consult Note (Signed)
Reason for Consult:  Left foot wound Referring Physician:  Dr. Barnetta Hammersmith is an 65 y.o. male.  HPI: 66 y/o male without significant pmh dropped a heavy weight on his left foot at work over a month ago.  He had a bruise afterwards, but it resolved without difficulty.  A week ago the pt travelled to [place] for work and had increasing pain, swelling, warmth and redness on the dorsum of the foot.  He was admitted by Dr. Mahala Menghini and treated for cellulitis with IV vanc.  He was converted to oral keflex and sent home a few days ago.  He noted over the last 24 hours increasing pain and drainage from the wound.  He was seen at [facility] and found to have an abscess on the dorsum of the foot.  It was drained but continues to have purulent and serous drainage.  He c/o severe pain with WB.  He denies f/c/n/v/wt loss.  Foot feels better with elevation.  Currently taking keflex and cipro.  Past Medical History  Diagnosis Date  . Back pain   . Arthritis   . Hypertension   . Enlarged prostate     Past Surgical History  Procedure Date  . Stomach stapled   . Cholecystectomy   . Appendectomy   . Arthroscopic knee surgery     Family History  Problem Relation Age of Onset  . Diabetes Other   . Stroke Other   . Hypertension Other   . Coronary artery disease Other   . Heart failure Other     Social History:  reports that he has never smoked. He has never used smokeless tobacco. He reports that he does not drink alcohol or use illicit drugs.  Allergies: No Known Allergies  Medications: I have reviewed the patient's current medications.  Results for orders placed during the hospital encounter of 08/07/11 (from the past 48 hour(s))  CBC     Status: Normal   Collection Time   08/07/11  3:10 PM      Component Value Range Comment   WBC 5.9  4.0 - 10.5 K/uL    RBC 4.53  4.22 - 5.81 MIL/uL    Hemoglobin 13.5  13.0 - 17.0 g/dL    HCT 09.8  11.9 - 14.7 %    MCV 89.8  78.0 - 100.0 fL    MCH 29.8  26.0  - 34.0 pg    MCHC 33.2  30.0 - 36.0 g/dL    RDW 82.9  56.2 - 13.0 %    Platelets 372  150 - 400 K/uL   BASIC METABOLIC PANEL     Status: Abnormal   Collection Time   08/07/11  3:10 PM      Component Value Range Comment   Sodium 134 (*) 135 - 145 mEq/L    Potassium 4.2  3.5 - 5.1 mEq/L    Chloride 98  96 - 112 mEq/L    CO2 31  19 - 32 mEq/L    Glucose, Bld 105 (*) 70 - 99 mg/dL    BUN 23  6 - 23 mg/dL    Creatinine, Ser 8.65  0.50 - 1.35 mg/dL    Calcium 9.2  8.4 - 78.4 mg/dL    GFR calc non Af Amer 89 (*) >90 mL/min    GFR calc Af Amer >90  >90 mL/min     Dg Foot Complete Left  08/07/2011  *RADIOLOGY REPORT*  Clinical Data: Abscess to mid foot area  LEFT FOOT - COMPLETE 3+ VIEW  Comparison: None  Findings: There is diffuse soft tissue swelling.  A more focal area of swelling is identified along the dorsum of the metatarsals.  No radiopaque foreign bodies or soft tissue calcifications noted. There are no focal bony erosions or evidence of bony destruction. No fractures or subluxations.  IMPRESSION:  1.  Soft tissue swelling.  Original Report Authenticated By: Rosealee Albee, M.D.    ROS:  As above PE:  Blood pressure 106/72, pulse 80, temperature 98.1 F (36.7 C), temperature source Oral, resp. rate 20, height 6' (1.829 m), weight 145.7 kg (321 lb 3.4 oz), SpO2 95.00%. Obese male in nad.  A and O x 4.  Mood and affect normal.  EOMI.  Resp unlabored.  L foot with 2 cm area of skin loss on the dorsum of the foot.  Mild local erythema but no lymphangiitis.  5/5 strength in DF and PF at ankel and toes.  Feels LT in sup and deep peroneal n dist.  No lymphadenopathy.  Area of fluctuance lateral to wound is very TTP.  Assessment/Plan: L dorsal foot abscess - based on the refractory nature of this foot wound, I think it's likely that he has an abscess in the dorsal subcut tissue.  I believe this requires surgical treatment for I and D and possible application of a wound VAC.  The risks and  benefits of the alternative treatment options have been discussed in detail.  The patient wishes to proceed with surgery and specifically understands risks of bleeding, infection, nerve damage, blood clots, need for additional surgery, amputation and death.   Toni Arthurs 2011-09-05, 4:24 PM

## 2011-08-07 NOTE — Progress Notes (Signed)
ANTIBIOTIC CONSULT NOTE - INITIAL  Pharmacy Consult for Vancomycin Indication: LE cellulitis  No Known Allergies  Patient Measurements: Height: 6' (182.9 cm) Weight: 321 lb 3.4 oz (145.7 kg) IBW/kg (Calculated) : 77.6   Vital Signs: Temp: 98.1 F (36.7 C) (08/03 1345) Temp src: Oral (08/03 1345) BP: 106/72 mmHg (08/03 1345) Pulse Rate: 80  (08/03 1345) Intake/Output from previous day:   Intake/Output from this shift:    Labs:  Basename 08/07/11 1510  WBC 5.9  HGB 13.5  PLT 372  LABCREA --  CREATININE 0.86   Estimated Creatinine Clearance: 126.9 ml/min (by C-G formula based on Cr of 0.86). No results found for this basename: VANCOTROUGH:2,VANCOPEAK:2,VANCORANDOM:2,GENTTROUGH:2,GENTPEAK:2,GENTRANDOM:2,TOBRATROUGH:2,TOBRAPEAK:2,TOBRARND:2,AMIKACINPEAK:2,AMIKACINTROU:2,AMIKACIN:2, in the last 72 hours   Microbiology: Recent Results (from the past 720 hour(s))  CULTURE, BLOOD (ROUTINE X 2)     Status: Normal   Collection Time   07/30/11  8:40 PM      Component Value Range Status Comment   Specimen Description BLOOD LEFT ARM   Final    Special Requests BOTTLES DRAWN AEROBIC AND ANAEROBIC    Final    Culture  Setup Time 07/31/2011 01:22   Final    Culture NO GROWTH 5 DAYS   Final    Report Status 08/06/2011 FINAL   Final   CULTURE, BLOOD (ROUTINE X 2)     Status: Normal   Collection Time   07/30/11  9:00 PM      Component Value Range Status Comment   Specimen Description BLOOD LEFT ARM   Final    Special Requests BOTTLES DRAWN AEROBIC AND ANAEROBIC    Final    Culture  Setup Time 07/31/2011 01:22   Final    Culture NO GROWTH 5 DAYS   Final    Report Status 08/06/2011 FINAL   Final     Medical History: Past Medical History  Diagnosis Date  . Back pain   . Arthritis   . Hypertension   . Enlarged prostate     Medications:  Anti-infectives     Start     Dose/Rate Route Frequency Ordered Stop   08/08/11 0500   vancomycin (VANCOCIN) 1,500 mg in sodium  chloride 0.9 % 500 mL IVPB        1,500 mg 250 mL/hr over 120 Minutes Intravenous Every 12 hours 08/07/11 1543     08/07/11 1700   vancomycin (VANCOCIN) 2,000 mg in sodium chloride 0.9 % 500 mL IVPB        2,000 mg 250 mL/hr over 120 Minutes Intravenous  Once 08/07/11 1543           Assessment:  Recent admit 7/26-7/30 for treatment of LLE wound  Sent home on Keflex po at discharge from [facility]  Purulent discharge noted at home; Cipro po added per Urgent Care visit.  No bone involvement per LE Xray  Goal of Therapy:  Vancomycin trough level 10-15 mcg/ml  Plan:  Vancomycin 2gm x1, then continue with 1500mg  q12 Trough at steady state.  Otho Bellows PharmD Pager [phone] 08/07/2011,3:44 PM

## 2011-08-08 ENCOUNTER — Encounter (HOSPITAL_COMMUNITY): Payer: Self-pay | Admitting: *Deleted

## 2011-08-08 LAB — CBC
MCH: 29.7 pg (ref 26.0–34.0)
MCHC: 32.9 g/dL (ref 30.0–36.0)
Platelets: 358 10*3/uL (ref 150–400)

## 2011-08-08 LAB — BASIC METABOLIC PANEL
Calcium: 8.6 mg/dL (ref 8.4–10.5)
GFR calc non Af Amer: >90 mL/min (ref >90)
Sodium: 135 mEq/L (ref 135–145)

## 2011-08-08 MED ORDER — ENOXAPARIN SODIUM 40 MG/0.4ML ~~LOC~~ SOLN
40.0000 mg | SUBCUTANEOUS | Status: DC
  Administered 2011-08-08 – 2011-08-11 (×4): 40 mg via SUBCUTANEOUS
  Filled 2011-08-08 (×4): qty 0.4

## 2011-08-08 MED ORDER — HYDROMORPHONE HCL PF 1 MG/ML IJ SOLN
1.0000 mg | INTRAMUSCULAR | Status: DC | PRN
  Administered 2011-08-08 (×2): 1 mg via INTRAVENOUS
  Administered 2011-08-08: 2 mg via INTRAVENOUS
  Administered 2011-08-09: 1 mg via INTRAVENOUS
  Filled 2011-08-08: qty 1
  Filled 2011-08-08: qty 2
  Filled 2011-08-08 (×2): qty 1

## 2011-08-08 MED ORDER — HYDROMORPHONE HCL PF 1 MG/ML IJ SOLN
1.0000 mg | INTRAMUSCULAR | Status: DC | PRN
  Administered 2011-08-08 (×2): 1 mg via INTRAVENOUS
  Filled 2011-08-08 (×2): qty 1

## 2011-08-08 MED ORDER — PNEUMOCOCCAL VAC POLYVALENT 25 MCG/0.5ML IJ INJ
0.5000 mL | INJECTION | INTRAMUSCULAR | Status: AC
  Administered 2011-08-09: 0.5 mL via INTRAMUSCULAR
  Filled 2011-08-08: qty 0.5

## 2011-08-08 NOTE — Progress Notes (Signed)
Orthopedic Tech Progress Note Patient Details:  Keith Watkins [birth year] [id number]  Patient ID: Keith Watkins, male   DOB: 06/06/1945, 66 y.o.   MRN: [id number]   Tawni Carnes [facility] 08/08/2011, 2:56 PM Pt already has a post op shoe in the room that he came with it from home.

## 2011-08-08 NOTE — Progress Notes (Signed)
On call provider notified of patient pain unrelieved by present orders for Dilaudid and Vicodin. New order for Dilaudid placed with frequency of Q 3 hours

## 2011-08-08 NOTE — Progress Notes (Signed)
Assessment/Plan: 1. Foot abscess, left S/p I&D yesterday Continue IV Vancomycin IVF FU wound cultures Greatly appreciate Dr.Hewitt's input  2. Obesity/Metabolic syndrome Hbaic:5.6 Life style modification  3. BPH: continue flomax  4. DVT prophylaxis: lovenox, ambulate w/ boot   LOS: 1 day   Keith Watkins Pager: [phone] 08/08/2011, 12:16 PM   Subjective: Doing ok, pain 3/10  Objective: Vital signs in last 24 hours: Temp:  [97.4 F (36.3 C)-98.1 F (36.7 C)] 97.8 F (36.6 C) (08/04 0905) Pulse Rate:  [80-102] 94  (08/04 0905) Resp:  [10-20] 20  (08/04 0905) BP: (88-143)/(52-81) 116/77 mmHg (08/04 0905) SpO2:  [94 %-100 %] 94 % (08/04 0905) Weight:  [145.7 kg (321 lb 3.4 oz)] 145.7 kg (321 lb 3.4 oz) (08/03 1345) Weight change:  Last BM Date: 08/07/11  Intake/Output from previous day: 08/03 0701 - 08/04 0700 In: 1050 [P.O.:100; I.V.:950] Out: 425 [Urine:400; Blood:25] Total I/O In: 480 [P.O.:480] Out: 100 [Urine:100]   Physical Exam:  General exam alert awake obese oriented x3 no rest  HEENT oral mucosa moist and pink neck no JVD or lymphadenopathy  CVS S1-S2 regular rate rhythm no murmurs rubs or gallops  Abdomen obese soft nontender with normal bowel sounds organomegaly  Extremities dressing over the L foot.  Neuro moves all extremities no localizing signs  Musculoskeletal no evidence of joint deformities or synovitis  Psychiatric appropriate mood and affect    Lab Results: Basic Metabolic Panel:  Basename 08/08/11 0419 08/07/11 1510  NA 135 134*  K 3.9 4.2  CL 100 98  CO2 29 31  GLUCOSE 121* 105*  BUN 20 23  CREATININE 0.77 0.86  CALCIUM 8.6 9.2  MG -- --  PHOS -- --   Liver Function Tests: No results found for this basename: AST:2,ALT:2,ALKPHOS:2,BILITOT:2,PROT:2,ALBUMIN:2 in the last 72 hours No results found for this basename: LIPASE:2,AMYLASE:2 in the last 72 hours No results found for this basename: AMMONIA:2 in  the last 72 hours CBC:  Basename 08/08/11 0419 08/07/11 1510  WBC 6.0 5.9  NEUTROABS -- --  HGB 12.5* 13.5  HCT 38.0* 40.7  MCV 90.3 89.8  PLT 358 372   Cardiac Enzymes: No results found for this basename: CKTOTAL:3,CKMB:3,CKMBINDEX:3,TROPONINI:3 in the last 72 hours BNP: No results found for this basename: PROBNP:3 in the last 72 hours D-Dimer: No results found for this basename: DDIMER:2 in the last 72 hours CBG: No results found for this basename: GLUCAP:6 in the last 72 hours Hemoglobin A1C:  Basename 08/07/11 1510  HGBA1C 5.6   Fasting Lipid Panel: No results found for this basename: CHOL,HDL,LDLCALC,TRIG,CHOLHDL,LDLDIRECT in the last 72 hours Thyroid Function Tests: No results found for this basename: TSH,T4TOTAL,FREET4,T3FREE,THYROIDAB in the last 72 hours Anemia Panel: No results found for this basename: VITAMINB12,FOLATE,FERRITIN,TIBC,IRON,RETICCTPCT in the last 72 hours Coagulation: No results found for this basename: LABPROT:2,INR:2 in the last 72 hours Urine Drug Screen: Drugs of Abuse  No results found for this basename: labopia, cocainscrnur, labbenz, amphetmu, thcu, labbarb    Alcohol Level: No results found for this basename: ETH:2 in the last 72 hours Urinalysis: No results found for this basename: COLORURINE:2,APPERANCEUR:2,LABSPEC:2,PHURINE:2,GLUCOSEU:2,HGBUR:2,BILIRUBINUR:2,KETONESUR:2,PROTEINUR:2,UROBILINOGEN:2,NITRITE:2,LEUKOCYTESUR:2 in the last 72 hours Misc. Labs:  Recent Results (from the past 240 hour(s))  CULTURE, BLOOD (ROUTINE X 2)     Status: Normal   Collection Time   07/30/11  8:40 PM      Component Value Range Status Comment   Specimen Description BLOOD LEFT ARM   Final    Special Requests BOTTLES DRAWN AEROBIC  AND ANAEROBIC    Final    Culture  Setup Time 07/31/2011 01:22   Final    Culture NO GROWTH 5 DAYS   Final    Report Status 08/06/2011 FINAL   Final   CULTURE, BLOOD (ROUTINE X 2)     Status: Normal   Collection Time    07/30/11  9:00 PM      Component Value Range Status Comment   Specimen Description BLOOD LEFT ARM   Final    Special Requests BOTTLES DRAWN AEROBIC AND ANAEROBIC    Final    Culture  Setup Time 07/31/2011 01:22   Final    Culture NO GROWTH 5 DAYS   Final    Report Status 08/06/2011 FINAL   Final   ANAEROBIC CULTURE     Status: Normal (Preliminary result)   Collection Time   08/07/11  8:34 PM      Component Value Range Status Comment   Specimen Description FOOT LEFT DORSAL WOUND   Final    Special Requests NONE   Final    Gram Stain PENDING   Incomplete    Culture     Final    Value: NO ANAEROBES ISOLATED; CULTURE IN PROGRESS FOR 5 DAYS   Report Status PENDING   Incomplete     Studies/Results: Dg Foot Complete Left  08/07/2011  *RADIOLOGY REPORT*  Clinical Data: Abscess to mid foot area  LEFT FOOT - COMPLETE 3+ VIEW  Comparison: None  Findings: There is diffuse soft tissue swelling.  A more focal area of swelling is identified along the dorsum of the metatarsals.  No radiopaque foreign bodies or soft tissue calcifications noted. There are no focal bony erosions or evidence of bony destruction. No fractures or subluxations.  IMPRESSION:  1.  Soft tissue swelling.  Original Report Authenticated By: Rosealee Albee, M.D.    Medications: Scheduled Meds:   . docusate sodium  100 mg Oral BID  . pneumococcal 23 valent vaccine  0.5 mL Intramuscular Tomorrow-1000  . senna  1 tablet Oral BID  . Tamsulosin HCl  0.8 mg Oral QHS  . vancomycin  1,500 mg Intravenous Q12H  . vancomycin  2,000 mg Intravenous Once   Continuous Infusions:   . sodium chloride 100 mL/hr (08/08/11 0917)  . DISCONTD: sodium chloride    . DISCONTD: sodium chloride    . DISCONTD: lactated ringers     PRN Meds:.acetaminophen, acetaminophen, HYDROcodone-acetaminophen, HYDROmorphone (DILAUDID) injection, metoCLOPramide (REGLAN) injection, metoCLOPramide, ondansetron (ZOFRAN) IV, ondansetron, DISCONTD: 0.9 % irrigation  (POUR BTL), DISCONTD:  HYDROmorphone (DILAUDID) injection, DISCONTD:  HYDROmorphone (DILAUDID) injection, DISCONTD:  HYDROmorphone (DILAUDID) injection, DISCONTD: meperidine (DEMEROL) injection, DISCONTD: ondansetron (ZOFRAN) IV DISCONTD: ondansetron, DISCONTD: promethazine, DISCONTD: sodium chloride irrigation

## 2011-08-09 ENCOUNTER — Encounter (HOSPITAL_COMMUNITY): Payer: Self-pay | Admitting: Orthopedic Surgery

## 2011-08-09 DIAGNOSIS — E669 Obesity, unspecified: Secondary | ICD-10-CM

## 2011-08-09 DIAGNOSIS — N3941 Urge incontinence: Secondary | ICD-10-CM | POA: Diagnosis present

## 2011-08-09 DIAGNOSIS — N401 Enlarged prostate with lower urinary tract symptoms: Secondary | ICD-10-CM | POA: Diagnosis present

## 2011-08-09 HISTORY — DX: Obesity, unspecified: E66.9

## 2011-08-09 NOTE — Progress Notes (Signed)
UR done. 

## 2011-08-09 NOTE — Progress Notes (Signed)
Subjective: 2 Days Post-Op Procedure(s) (LRB): IRRIGATION AND DEBRIDEMENT EXTREMITY (Left) Patient reports pain as mild.    Objective: Vital signs in last 24 hours: Temp:  [97.6 F (36.4 C)-98 F (36.7 C)] 98 F (36.7 C) (08/05 0518) Pulse Rate:  [83-96] 96  (08/05 0518) Resp:  [16-18] 16  (08/05 0518) BP: (117-135)/(71-86) 135/86 mmHg (08/05 0518) SpO2:  [94 %-96 %] 96 % (08/05 0518)  Intake/Output from previous day: 08/04 0701 - 08/05 0700 In: 3540.8 [P.O.:1040; I.V.:2500.8] Out: 800 [Urine:800] Intake/Output this shift: Total I/O In: 630 [P.O.:630] Out: 1400 [Urine:1400]   Basename 08/08/11 0419 08/07/11 1510  HGB 12.5* 13.5    Basename 08/08/11 0419 08/07/11 1510  WBC 6.0 5.9  RBC 4.21* 4.53  HCT 38.0* 40.7  PLT 358 372    Basename 08/08/11 0419 08/07/11 1510  NA 135 134*  K 3.9 4.2  CL 100 98  CO2 29 31  BUN 20 23  CREATININE 0.77 0.86  GLUCOSE 121* 105*  CALCIUM 8.6 9.2     L dorsal foot wound with some localized erythema.  Packing removed.  No purulence.  No necrotic material.  Assessment/Plan: 2 Days Post-Op Procedure(s) (LRB): IRRIGATION AND DEBRIDEMENT EXTREMITY (Left)  Packing changed.  We'll continue with packing changes daily.  Continue IV vanc.  Cxs pending.  WBAT in hard sole shoe.  Toni Arthurs 08/09/2011, 12:29 PM

## 2011-08-10 LAB — WOUND CULTURE: Culture: NO GROWTH

## 2011-08-10 MED ORDER — VANCOMYCIN HCL 1000 MG IV SOLR
1250.0000 mg | Freq: Two times a day (BID) | INTRAVENOUS | Status: DC
  Administered 2011-08-10 – 2011-08-11 (×4): 1250 mg via INTRAVENOUS
  Filled 2011-08-10 (×5): qty 1250

## 2011-08-10 NOTE — Progress Notes (Signed)
Phone call to Pharmacy. Spoke with Pharmacist  Notified Vanc trough level was 22.9. Hold 0500 dose of Vanc until pharmacy can adjust dose per Nolon Lennert Pharmacist.

## 2011-08-10 NOTE — Progress Notes (Addendum)
Check of IV revealed no redness, flushes easily without any edema. IVF currently infusing. Pt reports it is not sore at present but noted IV does have discomfort from [facility] infusions when those are scheduled. Denied any pain at site at present with light palpation, no redness but will follow closely.

## 2011-08-10 NOTE — Progress Notes (Signed)
ANTIBIOTIC CONSULT NOTE - F/U  Pharmacy Consult for Vancomycin Indication: LE cellulitis  No Known Allergies  Patient Measurements: Height: 6' (182.9 cm) Weight: 321 lb 3.4 oz (145.7 kg) IBW/kg (Calculated) : 77.6   Vital Signs: Temp: 98 F (36.7 C) (08/05 2132) Temp src: Oral (08/05 2132) BP: 142/79 mmHg (08/05 2132) Pulse Rate: 103  (08/05 2132) Intake/Output from previous day: 08/05 0701 - 08/06 0700 In: 2049.2 [P.O.:870; I.V.:1179.2] Out: 1900 [Urine:1900] Intake/Output from this shift: Total I/O In: 1299.2 [P.O.:120; I.V.:1179.2] Out: 500 [Urine:500]  Labs:  [facility] 08/08/11 0419 08/07/11 1510  WBC 6.0 5.9  HGB 12.5* 13.5  PLT 358 372  LABCREA -- --  CREATININE 0.77 0.86   Estimated Creatinine Clearance: 136.5 ml/min (by C-G formula based on Cr of 0.77).  Basename 08/10/11 0349  VANCOTROUGH 22.9*  VANCOPEAK --  Drue Dun --  GENTTROUGH --  GENTPEAK --  GENTRANDOM --  TOBRATROUGH --  TOBRAPEAK --  TOBRARND --  AMIKACINPEAK --  AMIKACINTROU --  AMIKACIN --     Microbiology: Recent Results (from the past 720 hour(s))  CULTURE, BLOOD (ROUTINE X 2)     Status: Normal   Collection Time   07/30/11  8:40 PM      Component Value Range Status Comment   Specimen Description BLOOD LEFT ARM   Final    Special Requests BOTTLES DRAWN AEROBIC AND ANAEROBIC    Final    Culture  Setup Time 07/31/2011 01:22   Final    Culture NO GROWTH 5 DAYS   Final    Report Status 08/06/2011 FINAL   Final   CULTURE, BLOOD (ROUTINE X 2)     Status: Normal   Collection Time   07/30/11  9:00 PM      Component Value Range Status Comment   Specimen Description BLOOD LEFT ARM   Final    Special Requests BOTTLES DRAWN AEROBIC AND ANAEROBIC    Final    Culture  Setup Time 07/31/2011 01:22   Final    Culture NO GROWTH 5 DAYS   Final    Report Status 08/06/2011 FINAL   Final   WOUND CULTURE     Status: Normal (Preliminary result)   Collection Time   08/07/11  8:34 PM   Component Value Range Status Comment   Specimen Description FOOT LEFT DORSAL WOUND   Final    Special Requests NONE   Final    Gram Stain PENDING   Incomplete    Culture NO GROWTH 1 DAY   Final    Report Status PENDING   Incomplete   ANAEROBIC CULTURE     Status: Normal (Preliminary result)   Collection Time   08/07/11  8:34 PM      Component Value Range Status Comment   Specimen Description FOOT LEFT DORSAL WOUND   Final    Special Requests NONE   Final    Gram Stain PENDING   Incomplete    Culture     Final    Value: NO ANAEROBES ISOLATED; CULTURE IN PROGRESS FOR 5 DAYS   Report Status PENDING   Incomplete     Medical History: Past Medical History  Diagnosis Date  . Back pain   . Arthritis   . Hypertension   . Enlarged prostate     Medications:  Anti-infectives     Start     Dose/Rate Route Frequency Ordered Stop   08/08/11 0500   vancomycin (VANCOCIN) 1,500 mg in sodium chloride 0.9 %  500 mL IVPB        1,500 mg 250 mL/hr over 120 Minutes Intravenous Every 12 hours 08/07/11 1543     08/07/11 1700   vancomycin (VANCOCIN) 2,000 mg in sodium chloride 0.9 % 500 mL IVPB        2,000 mg 250 mL/hr over 120 Minutes Intravenous  Once 08/07/11 1543 08/07/11 1857         Assessment:  Recent admit 7/26-7/30 for treatment of LLE wound  Sent home on Keflex po at discharge from [facility]  Purulent discharge noted at home; Cipro po added per Urgent Care visit.  No bone involvement per LE Xray  VT = 22.9 after 1500mg  IV q12h @ [facility].  Above desired goal of 10-15 mcg/ml.  Goal of Therapy:  Vancomycin trough level 10-15 mcg/ml  Plan:   Decrease Vancomycin to 1250mg  IV q12h.  F/U SCr/levels as needed.   Lorenza Evangelist PharmD Pager [phone] 08/10/2011,5:18 AM

## 2011-08-10 NOTE — Progress Notes (Signed)
Spoke with spouse. She stated she saw some areas on patient back that appeared to be red raised. Almost the appearance of "mosquito bites". Stated pt did not have these before he came in to the hospital. Pt currently lying on back resting. RN will evaluate later.

## 2011-08-10 NOTE — Progress Notes (Signed)
Spoke to pt and stated he has mild heartburn. Pt does not specifically have an order for indigestion. Refused RN suggestion to call MD. Declined offer for ice chips. States " I am ok. I dont need them". Told to call with worsening symptoms.

## 2011-08-10 NOTE — Progress Notes (Addendum)
Brief summary:Mr. Keith Watkins is a 66 year old male was discharged from [facility] 6 days ago, he had apparently injured his leg a month ago at work was admitted 7/26 when he noted pain swelling and redness of the entire left leg , he was treated for cellulitis originally with vancomycin, had a good clinical improvement and was discharged home 7/30 on Keflex.  Admitted 8/3 with abscess of L foot, s/p I&D 8/3 with clinical improvement   Assessment/Plan: 1. Foot abscess, left S/p I&D 8/3 Continue IV Vancomycin Wound cultures negative so far Await Dr.Hewitt's input today regarding need to continue IV Vanc, if so will request PICC line  2. Obesity/Metabolic syndrome Hbaic:5.6 Life style modification  3. BPH: continue flomax  4. DVT prophylaxis: lovenox, ambulate w/ boot  Dispo: home soon   LOS: 1 day   Keith Watkins [facility] Pager: [phone] 08/08/2011, 12:16 PM   Subjective: Doing ok, pain controlled  Objective: Vital signs in last 24 hours: Temp:  [98 F (36.7 C)-98.4 F (36.9 C)] 98.4 F (36.9 C) (08/06 1306) Pulse Rate:  [72-103] 89  (08/06 1306) Resp:  [18-20] 18  (08/06 1306) BP: (136-147)/(61-79) 136/76 mmHg (08/06 1306) SpO2:  [96 %-97 %] 97 % (08/06 1306) Weight change:  Last BM Date: 08/06/11  Intake/Output from previous day: 08/05 0701 - 08/06 0700 In: 2049.2 [P.O.:870; I.V.:1179.2] Out: 2625 [Urine:2625] Total I/O In: 678.3 [P.O.:240; I.V.:438.3] Out: 401 [Urine:400; Stool:1]   Physical Exam:  General exam alert awake obese oriented x3 no rest  HEENT oral mucosa moist and pink neck no JVD or lymphadenopathy  CVS S1-S2 regular rate rhythm no murmurs rubs or gallops  Abdomen obese soft nontender with normal bowel sounds organomegaly  Extremities dressing over the L foot.  Neuro moves all extremities no localizing signs  Musculoskeletal no evidence of joint deformities or synovitis  Psychiatric appropriate mood and affect    Lab  Results: Basic Metabolic Panel:  Basename 08/08/11 0419  NA 135  K 3.9  CL 100  CO2 29  GLUCOSE 121*  BUN 20  CREATININE 0.77  CALCIUM 8.6  MG --  PHOS --   Liver Function Tests: No results found for this basename: AST:2,ALT:2,ALKPHOS:2,BILITOT:2,PROT:2,ALBUMIN:2 in the last 72 hours No results found for this basename: LIPASE:2,AMYLASE:2 in the last 72 hours No results found for this basename: AMMONIA:2 in the last 72 hours CBC:  Basename 08/08/11 0419  WBC 6.0  NEUTROABS --  HGB 12.5*  HCT 38.0*  MCV 90.3  PLT 358   Cardiac Enzymes: No results found for this basename: CKTOTAL:3,CKMB:3,CKMBINDEX:3,TROPONINI:3 in the last 72 hours BNP: No results found for this basename: PROBNP:3 in the last 72 hours D-Dimer: No results found for this basename: DDIMER:2 in the last 72 hours CBG: No results found for this basename: GLUCAP:6 in the last 72 hours Hemoglobin A1C: No results found for this basename: HGBA1C in the last 72 hours Fasting Lipid Panel: No results found for this basename: CHOL,HDL,LDLCALC,TRIG,CHOLHDL,LDLDIRECT in the last 72 hours Thyroid Function Tests: No results found for this basename: TSH,T4TOTAL,FREET4,T3FREE,THYROIDAB in the last 72 hours Anemia Panel: No results found for this basename: VITAMINB12,FOLATE,FERRITIN,TIBC,IRON,RETICCTPCT in the last 72 hours Coagulation: No results found for this basename: LABPROT:2,INR:2 in the last 72 hours Urine Drug Screen: Drugs of Abuse  No results found for this basename: labopia,  cocainscrnur,  labbenz,  amphetmu,  thcu,  labbarb    Alcohol Level: No results found for this basename: ETH:2 in the last 72 hours Urinalysis: No results found  for this basename: COLORURINE:2,APPERANCEUR:2,LABSPEC:2,PHURINE:2,GLUCOSEU:2,HGBUR:2,BILIRUBINUR:2,KETONESUR:2,PROTEINUR:2,UROBILINOGEN:2,NITRITE:2,LEUKOCYTESUR:2 in the last 72 hours Misc. Labs:  Recent Results (from the past 240 hour(s))  WOUND CULTURE     Status: Normal     Collection Time   08/07/11  8:34 PM      Component Value Range Status Comment   Specimen Description FOOT LEFT DORSAL WOUND   Final    Special Requests NONE   Final    Gram Stain     Final    Value: RARE WBC PRESENT,BOTH PMN AND MONONUCLEAR     NO SQUAMOUS EPITHELIAL CELLS SEEN     NO ORGANISMS SEEN   Culture NO GROWTH 2 DAYS   Final    Report Status 08/10/2011 FINAL   Final   ANAEROBIC CULTURE     Status: Normal (Preliminary result)   Collection Time   08/07/11  8:34 PM      Component Value Range Status Comment   Specimen Description FOOT LEFT DORSAL WOUND   Final    Special Requests NONE   Final    Gram Stain     Final    Value: FEW WBC PRESENT,BOTH PMN AND MONONUCLEAR     NO SQUAMOUS EPITHELIAL CELLS SEEN     NO ORGANISMS SEEN   Culture     Final    Value: NO ANAEROBES ISOLATED; CULTURE IN PROGRESS FOR 5 DAYS   Report Status PENDING   Incomplete     Studies/Results: No results found.  Medications: Scheduled Meds:    . docusate sodium  100 mg Oral BID  . enoxaparin (LOVENOX) injection  40 mg Subcutaneous Q24H  . senna  1 tablet Oral BID  . Tamsulosin HCl  0.8 mg Oral QHS  . vancomycin  1,250 mg Intravenous Q12H  . DISCONTD: vancomycin  1,500 mg Intravenous Q12H   Continuous Infusions:    . sodium chloride 50 mL/hr at 08/10/11 0635   PRN Meds:.acetaminophen, acetaminophen, HYDROcodone-acetaminophen, HYDROmorphone (DILAUDID) injection, metoCLOPramide (REGLAN) injection, metoCLOPramide, ondansetron (ZOFRAN) IV, ondansetron

## 2011-08-10 NOTE — Progress Notes (Signed)
RN inspected pt's back. No signs of any beginning pressure areas. One small bump noted mid upper back that did resemble mosquito bite. Pt had some sheet marks with redness on upper back. Pt. Denied any complaints.

## 2011-08-10 NOTE — Progress Notes (Signed)
Subjective: 3 Days Post-Op Procedure(s) (LRB): IRRIGATION AND DEBRIDEMENT EXTREMITY (Left) Patient reports pain as mild.  No f/c/n/v.  Cxs:  No growth to date.  No organisms onGS.  Objective: Vital signs in last 24 hours: Temp:  [98 F (36.7 C)-98.4 F (36.9 C)] 98.4 F (36.9 C) (08/06 1306) Pulse Rate:  [72-103] 89  (08/06 1306) Resp:  [18-20] 18  (08/06 1306) BP: (136-147)/(61-79) 136/76 mmHg (08/06 1306) SpO2:  [96 %-97 %] 97 % (08/06 1306)  Intake/Output from previous day: 08/05 0701 - 08/06 0700 In: 2049.2 [P.O.:870; I.V.:1179.2] Out: 2625 [Urine:2625] Intake/Output this shift: Total I/O In: 678.3 [P.O.:240; I.V.:438.3] Out: 401 [Urine:400; Stool:1]   Basename 08/08/11 0419  HGB 12.5*    Basename 08/08/11 0419  WBC 6.0  RBC 4.21*  HCT 38.0*  PLT 358    Basename 08/08/11 0419  NA 135  K 3.9  CL 100  CO2 29  BUN 20  CREATININE 0.77  GLUCOSE 121*  CALCIUM 8.6   PE:  L foot wound packing removed.  Moderate serous drainage.  No purulence.  Erythema resolving at dorsal foot.  Less swelling today.   Assessment/Plan: 3 Days Post-Op Procedure(s) (LRB): IRRIGATION AND DEBRIDEMENT EXTREMITY (Left)  Packing changed.  Recommend dressing changes by nursing staff.  See my orders for details. At this point only vanc has seemed to be effective in treating this infection.  I'd recommend treating him with IV vanc for two weeks by PICC line.  He can bear weight as tolerated in the hard sole shoe.  He'll need daily dressing changes by [facility] RN.  I'll plan to see him back in clinic in a week.  I'll sign off now.  Please call with any questions.  [phone]  Toni Arthurs 08/10/2011, 5:02 PM

## 2011-08-10 NOTE — Progress Notes (Signed)
Assessment/Plan: 1. Foot abscess, left S/p I&D  Continue IV Vancomycin IVF FU wound cultures Greatly appreciate Dr.Hewitt's input  2. Obesity/Metabolic syndrome Hbaic:5.6 Life style modification  3. BPH: continue flomax  4. DVT prophylaxis: lovenox, ambulate w/ boot   LOS: 1 day   Ettamae Barkett [facility] Pager: [phone] 08/08/2011, 12:16 PM   Subjective: Doing ok, pain controlled  Objective: Vital signs in last 24 hours: Temp:  [98 F (36.7 C)-98.4 F (36.9 C)] 98.4 F (36.9 C) (08/06 1306) Pulse Rate:  [72-103] 89  (08/06 1306) Resp:  [18-20] 18  (08/06 1306) BP: (136-147)/(61-79) 136/76 mmHg (08/06 1306) SpO2:  [96 %-97 %] 97 % (08/06 1306) Weight change:  Last BM Date: 08/06/11  Intake/Output from previous day: 08/05 0701 - 08/06 0700 In: 2049.2 [P.O.:870; I.V.:1179.2] Out: 2625 [Urine:2625] Total I/O In: 678.3 [P.O.:240; I.V.:438.3] Out: 401 [Urine:400; Stool:1]   Physical Exam:  General exam alert awake obese oriented x3 no rest  HEENT oral mucosa moist and pink neck no JVD or lymphadenopathy  CVS S1-S2 regular rate rhythm no murmurs rubs or gallops  Abdomen obese soft nontender with normal bowel sounds organomegaly  Extremities dressing over the L foot.  Neuro moves all extremities no localizing signs  Musculoskeletal no evidence of joint deformities or synovitis  Psychiatric appropriate mood and affect    Lab Results: Basic Metabolic Panel:  Basename 08/08/11 0419 08/07/11 1510  NA 135 134*  K 3.9 4.2  CL 100 98  CO2 29 31  GLUCOSE 121* 105*  BUN 20 23  CREATININE 0.77 0.86  CALCIUM 8.6 9.2  MG -- --  PHOS -- --   Liver Function Tests: No results found for this basename: AST:2,ALT:2,ALKPHOS:2,BILITOT:2,PROT:2,ALBUMIN:2 in the last 72 hours No results found for this basename: LIPASE:2,AMYLASE:2 in the last 72 hours No results found for this basename: AMMONIA:2 in the last 72 hours CBC:  Basename 08/08/11 0419 08/07/11  1510  WBC 6.0 5.9  NEUTROABS -- --  HGB 12.5* 13.5  HCT 38.0* 40.7  MCV 90.3 89.8  PLT 358 372   Cardiac Enzymes: No results found for this basename: CKTOTAL:3,CKMB:3,CKMBINDEX:3,TROPONINI:3 in the last 72 hours BNP: No results found for this basename: PROBNP:3 in the last 72 hours D-Dimer: No results found for this basename: DDIMER:2 in the last 72 hours CBG: No results found for this basename: GLUCAP:6 in the last 72 hours Hemoglobin A1C:  Basename 08/07/11 1510  HGBA1C 5.6   Fasting Lipid Panel: No results found for this basename: CHOL,HDL,LDLCALC,TRIG,CHOLHDL,LDLDIRECT in the last 72 hours Thyroid Function Tests: No results found for this basename: TSH,T4TOTAL,FREET4,T3FREE,THYROIDAB in the last 72 hours Anemia Panel: No results found for this basename: VITAMINB12,FOLATE,FERRITIN,TIBC,IRON,RETICCTPCT in the last 72 hours Coagulation: No results found for this basename: LABPROT:2,INR:2 in the last 72 hours Urine Drug Screen: Drugs of Abuse  No results found for this basename: labopia,  cocainscrnur,  labbenz,  amphetmu,  thcu,  labbarb    Alcohol Level: No results found for this basename: ETH:2 in the last 72 hours Urinalysis: No results found for this basename: COLORURINE:2,APPERANCEUR:2,LABSPEC:2,PHURINE:2,GLUCOSEU:2,HGBUR:2,BILIRUBINUR:2,KETONESUR:2,PROTEINUR:2,UROBILINOGEN:2,NITRITE:2,LEUKOCYTESUR:2 in the last 72 hours Misc. Labs:  Recent Results (from the past 240 hour(s))  WOUND CULTURE     Status: Normal   Collection Time   08/07/11  8:34 PM      Component Value Range Status Comment   Specimen Description FOOT LEFT DORSAL WOUND   Final    Special Requests NONE   Final    Gram Stain     Final  Value: RARE WBC PRESENT,BOTH PMN AND MONONUCLEAR     NO SQUAMOUS EPITHELIAL CELLS SEEN     NO ORGANISMS SEEN   Culture NO GROWTH 2 DAYS   Final    Report Status 08/10/2011 FINAL   Final   ANAEROBIC CULTURE     Status: Normal (Preliminary result)   Collection Time     08/07/11  8:34 PM      Component Value Range Status Comment   Specimen Description FOOT LEFT DORSAL WOUND   Final    Special Requests NONE   Final    Gram Stain     Final    Value: FEW WBC PRESENT,BOTH PMN AND MONONUCLEAR     NO SQUAMOUS EPITHELIAL CELLS SEEN     NO ORGANISMS SEEN   Culture     Final    Value: NO ANAEROBES ISOLATED; CULTURE IN PROGRESS FOR 5 DAYS   Report Status PENDING   Incomplete     Studies/Results: No results found.  Medications: Scheduled Meds:    . docusate sodium  100 mg Oral BID  . enoxaparin (LOVENOX) injection  40 mg Subcutaneous Q24H  . senna  1 tablet Oral BID  . Tamsulosin HCl  0.8 mg Oral QHS  . vancomycin  1,250 mg Intravenous Q12H  . DISCONTD: vancomycin  1,500 mg Intravenous Q12H   Continuous Infusions:    . sodium chloride 50 mL/hr at 08/10/11 0635   PRN Meds:.acetaminophen, acetaminophen, HYDROcodone-acetaminophen, HYDROmorphone (DILAUDID) injection, metoCLOPramide (REGLAN) injection, metoCLOPramide, ondansetron (ZOFRAN) IV, ondansetron

## 2011-08-11 DIAGNOSIS — I10 Essential (primary) hypertension: Secondary | ICD-10-CM | POA: Insufficient documentation

## 2011-08-11 DIAGNOSIS — L02619 Cutaneous abscess of unspecified foot: Secondary | ICD-10-CM

## 2011-08-11 DIAGNOSIS — L03119 Cellulitis of unspecified part of limb: Secondary | ICD-10-CM

## 2011-08-11 DIAGNOSIS — L0291 Cutaneous abscess, unspecified: Secondary | ICD-10-CM

## 2011-08-11 LAB — BASIC METABOLIC PANEL
BUN: 12 mg/dL (ref 6–23)
CO2: 30 mEq/L (ref 19–32)
Calcium: 8.6 mg/dL (ref 8.4–10.5)
Glucose, Bld: 106 mg/dL — ABNORMAL HIGH (ref 70–99)
Sodium: 138 mEq/L (ref 135–145)

## 2011-08-11 MED ORDER — SODIUM CHLORIDE 0.9 % IJ SOLN
10.0000 mL | Freq: Two times a day (BID) | INTRAMUSCULAR | Status: DC
  Administered 2011-08-11: 10 mL

## 2011-08-11 MED ORDER — HEPARIN SOD (PORK) LOCK FLUSH 100 UNIT/ML IV SOLN
250.0000 [IU] | INTRAVENOUS | Status: AC | PRN
  Administered 2011-08-11: 250 [IU]
  Filled 2011-08-11: qty 5

## 2011-08-11 MED ORDER — SODIUM CHLORIDE 0.9 % IJ SOLN
10.0000 mL | INTRAMUSCULAR | Status: DC | PRN
  Administered 2011-08-11: 10 mL

## 2011-08-11 MED ORDER — DSS 100 MG PO CAPS: 100.0000 mg | ORAL_CAPSULE | Freq: Two times a day (BID) | ORAL | Status: AC | PRN

## 2011-08-11 MED ORDER — SODIUM CHLORIDE 0.9 % IV SOLN: INTRAVENOUS | Status: DC

## 2011-08-11 NOTE — Discharge Summary (Signed)
Physician Discharge Summary  Patient ID: Keith Watkins MRN: [id number] DOB/AGE: 1945-05-25 66 y.o.  Admit date: 08/07/2011 Discharge date: 08/11/2011  Primary Care Physician:  None, [facility]  Follow up: Dr.Hewitt in 1 week Home health care for IV antibiotics and wound care  Discharge Diagnoses:   Foot abscess, left s/p I&D Recent cellulitis Obesity Metabolic syndrome BPH (benign prostatic hypertrophy)    Medication List  As of 08/11/2011  6:37 AM   STOP taking these medications         cephALEXin 500 MG capsule      ciprofloxacin 500 MG tablet      ibuprofen 800 MG tablet         TAKE these medications         DSS 100 MG Caps   Take 100 mg by mouth 2 (two) times daily as needed for constipation.      glucosamine-chondroitin 500-400 MG tablet   Take 1 tablet by mouth 2 (two) times daily.      HYDROcodone-acetaminophen 5-325 MG per tablet   Commonly known as: NORCO/VICODIN   Take 1-2 tablets by mouth every 4 (four) hours as needed. Pain      sodium chloride 0.9 % SOLN 250 mL with vancomycin 1000 MG SOLR   For 2 weeks, to be dosed by pharmacy protocol, (by home health agency), for Vanc trough of 10-15, repeat Vanc level twice a week and Bmet weekly.  Dispense: 2 week supply      Tamsulosin HCl 0.4 MG Caps   Commonly known as: FLOMAX   Take 0.8 mg by mouth at bedtime.           Consults:  1. Dr.Hewitt, Orthopedics   Significant Diagnostic Studies:  LEFT FOOT - COMPLETE 3+ VIEW Findings: There is diffuse soft tissue swelling. A more focal area of swelling is identified along the dorsum of the metatarsals. No radiopaque foreign bodies or soft tissue calcifications noted. There are no focal bony erosions or evidence of bony destruction. No fractures or subluxations.  IMPRESSION: 1. Soft tissue swelling.   Brief H and P: Chief Complaint:  L foot with swelling and draining wound  HPI:  Keith Watkins is a 66 year old male was discharged from [facility] 6 days ago, he had apparently injured his leg a month ago at work and did well until 7/26 when he noted pain swelling and redness of the entire left leg , he was treated for cellulitis originally with vancomycin, had a good clinical improvement and was discharged home on Keflex.  Since then, patient and daughter had noticed an area of pointing on the dorsum of his foot which was initially weeping and subsequently started draining blood-tinged purulent discharge. He went to urgent care yesterday, had some pus drained from it and was started on ciprofloxacin, went back today for reevaluation and was sent to [facility] for further evaluation and management  Hospital Course:  1. Foot abscess, left  S/p I&D 8/3 by Dr.Hewitt Continue IV Vancomycin for 2 weeks per Ortho, to be managed by home health, PICC line ordered Wound cultures negative so far  Wound care as recommended by Dr.Hewitt, FU in clinic in 1 week  2. Obesity/Metabolic syndrome  Hbaic:5.6  Life style modification   3. BPH: continue flomax    Time spent on Discharge:  Signed: Mckenzi Buonomo [facility]  08/11/2011, 6:37 AM

## 2011-08-11 NOTE — Progress Notes (Signed)
I have seen and examined patient, he states he feels better and remains afebrile and hemodynamically stable-medically stable for discharge as previously planned. Case manager assisting with setting up home health services via workers comp, and once that is set up patient will be discharged home for outpatient followup. Please see discharge summary of today 08/11/2011 per Dr. Jomarie Longs for details of patient's hospital stay. Donnalee Curry [facility]. [phone]

## 2011-08-11 NOTE — Progress Notes (Signed)
Peripherally Inserted Central Catheter/Midline Placement  The IV Nurse has discussed with the patient and/or persons authorized to consent for the patient, the purpose of this procedure and the potential benefits and risks involved with this procedure.  The benefits include less needle sticks, lab draws from the catheter and patient may be discharged home with the catheter.  Risks include, but not limited to, infection, bleeding, blood clot (thrombus formation), and puncture of an artery; nerve damage and irregular heat beat.  Alternatives to this procedure were also discussed.  PICC/Midline Placement Documentation        Keith Watkins 08/11/2011, 11:33 AM

## 2011-08-11 NOTE — Care Management Note (Signed)
    Page 1 of 1   08/11/2011     2:23:35 PM   CARE MANAGEMENT NOTE 08/11/2011  Patient:  Keith Watkins, Keith Watkins   Account Number:  [id number]  Date Initiated:  08/11/2011  Documentation initiated by:  CRAFT,TERRI  Subjective/Objective Assessment:   66 yo male admitted 08/07/11 with cellulitis     Action/Plan:   D/C when medically stable   Anticipated DC Date:  08/14/2011   Anticipated DC Plan:  HOME W HOME HEALTH SERVICES      DC Planning Services  CM consult      [facility] Choice  HOME HEALTH   Choice offered to / List presented to:  NA        HH arranged  HH-1 RN  IV Antibiotics      HH agency  [facility]   Status of service:  Completed, signed off   Discharge Disposition:  HOME W HOME HEALTH SERVICES  Per UR Regulation:  Reviewed for med. necessity/level of care/duration of stay  Comments:  08/11/11, Kathi Der RNC-MNN, BSN, [phone], CM received phone call from Jae Dire at [facility] stating [facility] will be  arranged through [facility] and [facility] will be providing antibiotics.  Pt notified.All questions answered. 08/11/11, Kathi Der RNC-MNN, BSN, [phone], CM received phone call from [facility] # [phone], ext. [phone]. Orders, Op note, H & P, facesheet, and discharge summary faxed to [phone] with confirmation.  Awaiting HH determination for services.  Pt updated and appreciative of update. 08/11/11, Kathi Der RNC-MNN, BSN, [phone], CM received referral.  CM contacted adjustor, Annabelle Harman, at # [phone], ID # [id number] for [facility] services.  Annabelle Harman referred CM to Domenic Moras, RN, at # [phone].  Rosey Bath stated they would need to check with their vendor to determine [facility] agency.  Pt updated on status of HH services and appreciative.

## 2011-08-11 NOTE — Progress Notes (Signed)
Patient did not have any questions about discharge information. After vancomycin finished, PICC flushed with 10 ml NS followed by 2.5 ml Heparin 100units/ml. Taken by wheelchair to personal vehicle.

## 2011-08-11 NOTE — Progress Notes (Signed)
Patient d/c instructions rendered, verbalized understanding.

## 2011-08-12 LAB — ANAEROBIC CULTURE

## 2011-12-03 ENCOUNTER — Telehealth: Payer: Self-pay

## 2011-12-03 NOTE — Telephone Encounter (Signed)
Pt would like to pick up any medical records from 07-30-11 and anything that pretains to that date of service if possible. Knows it will be at least Monday till those are ready for him please call [phone] when ready

## 2011-12-06 NOTE — Telephone Encounter (Signed)
Records ready for pickup. Patient notified. °

## 2012-03-14 ENCOUNTER — Encounter: Payer: Self-pay | Admitting: Internal Medicine

## 2012-11-20 ENCOUNTER — Ambulatory Visit (INDEPENDENT_AMBULATORY_CARE_PROVIDER_SITE_OTHER): Payer: BC Managed Care – PPO | Admitting: Family Medicine

## 2012-11-20 ENCOUNTER — Ambulatory Visit: Payer: BC Managed Care – PPO

## 2012-11-20 VITALS — BP 152/84 | HR 102 | Temp 98.6°F | Resp 20 | Ht 70.25 in | Wt 319.6 lb

## 2012-11-20 DIAGNOSIS — M76899 Other specified enthesopathies of unspecified lower limb, excluding foot: Secondary | ICD-10-CM

## 2012-11-20 DIAGNOSIS — M25551 Pain in right hip: Secondary | ICD-10-CM

## 2012-11-20 DIAGNOSIS — M549 Dorsalgia, unspecified: Secondary | ICD-10-CM

## 2012-11-20 DIAGNOSIS — M25559 Pain in unspecified hip: Secondary | ICD-10-CM

## 2012-11-20 DIAGNOSIS — M7061 Trochanteric bursitis, right hip: Secondary | ICD-10-CM

## 2012-11-20 MED ORDER — MELOXICAM 15 MG PO TABS: 15.0000 mg | ORAL_TABLET | Freq: Every day | ORAL | Status: DC

## 2012-11-20 MED ORDER — PREDNISONE 10 MG PO TABS: ORAL_TABLET | ORAL | Status: DC

## 2012-11-20 MED ORDER — OXYCODONE-ACETAMINOPHEN 5-325 MG PO TABS: 1.0000 | ORAL_TABLET | Freq: Three times a day (TID) | ORAL | Status: DC | PRN

## 2012-11-20 NOTE — Progress Notes (Signed)
[facility]:  Office Visit  Chief Complaint:  Chief Complaint  Patient presents with  . Hip Pain    right hip pain, pt states it has gotten worse over the past 2 weeks, constant pain  . Back Pain    due to hip pain    HPI: Keith Watkins is a 67 y.o. male who is here for constant 2 week history right hip pain, he has been traveling a lot,has 10/10 hip pain without back pain when he walks and sits, he is pain free when he lays down. Taking aleve and otc ibuprofen without relief. 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 HE has been working nonstop for several weeks, last week he drove for 600 miles to visit 3 sites. After he ahd back pain,  He came home and rested and the only time he gets any relief is laying down, has burning sensation. He has some painful radiation to his back but it is minimal. NKI, he has pain with movement and any ROM.Would like to get back to work next week. He has an orthopedist.   Larey Seat last year on ice boarding a plane and was seen here and subsequently saw Dr Netta Corrigan and told him he said he has back arthritis. Took otc meds and exercises. Stiff in the AM but once he moves around then it is better. He is not sure if he had hip or back pain at that time, the dull pain went away so he did not fee like it was in his hip.  No knee pain This feels worse than arthritis.  Past Medical History  Diagnosis Date  . Back pain   . Arthritis   . Hypertension   . Enlarged prostate    Past Surgical History  Procedure Laterality Date  . Stomach stapled    . Cholecystectomy    . Appendectomy    . Arthroscopic knee surgery    . I&d extremity  08/07/2011    Procedure: IRRIGATION AND DEBRIDEMENT EXTREMITY;  Surgeon: Toni Arthurs, MD;  Location: [facility];  Service: Orthopedics;  Laterality: Left;  irrigation and debridement left foot abscess    History   Social History  . Marital Status: Married    Spouse Name: N/A    Number of Children: N/A  . Years of Education: N/A   Social History Main Topics  . Smoking status: Never Smoker   . Smokeless tobacco: Never Used  . Alcohol Use: No  . Drug Use: No  . Sexual Activity: None   Other Topics Concern  . None   Social History Narrative  . None   Family History  Problem Relation Age of Onset  . Diabetes Other   . Stroke Other   . Hypertension Other   . Coronary artery disease Other   . Heart failure Other   . Heart disease Mother   . Cancer Sister     Breast  . Hypertension Brother   . Hypertension Maternal Grandmother   . Stroke Maternal Grandmother   . Heart disease Maternal Grandfather   . Hypertension Maternal Grandfather   . Diabetes Sister   . Heart disease Sister   . Hyperlipidemia Sister   . Hypertension Sister   . Stroke Sister    No Known Allergies Prior to Admission medications   Medication Sig Start Date End Date Taking? Authorizing Provider  Tamsulosin HCl (FLOMAX) 0.4 MG CAPS Take 0.8 mg by mouth at bedtime. 08/03/11  Yes Rhetta Mura, MD  glucosamine-chondroitin 500-400 MG tablet Take 1 tablet by mouth 2 (two) times daily.    Historical Provider, MD  sodium chloride 0.9 % SOLN 250 mL with vancomycin 1000 MG SOLR For 2 weeks, to be dosed by pharmacy protocol, (by home health agency), for Vanc trough of 10-15, repeat Vanc level twice a week and Bmet weekly. Dispense: 2 week supply 08/11/11   Zannie Cove, MD     ROS: The patient denies fevers, chills, night sweats, unintentional weight loss, chest pain, palpitations, wheezing, dyspnea on exertion, nausea, vomiting, abdominal pain, dysuria, hematuria, melena  All other systems have been reviewed and were otherwise negative with the exception of those mentioned in the HPI and as above.    PHYSICAL EXAM: Filed Vitals:   11/20/12 1633  BP: 152/84  Pulse: 102  Temp: 98.6 F (37 C)   Resp: 20   Filed Vitals:   11/20/12 1633  Height: 5' 10.25" (1.784 m)  Weight: 319 lb 9.6 oz (144.97 kg)   Body mass index is 45.55 kg/(m^2).  General: Alert, moderate distress, morbidy obese white  male HEENT:  Normocephalic, atraumatic, oropharynx patent. EOMI, PERRLA Cardiovascular:  Regular rate and rhythm, no rubs murmurs or gallops.  No Carotid bruits, radial pulse intact. No pedal edema.  Respiratory: Clear to auscultation bilaterally.  No wheezes, rales, or rhonchi.  No cyanosis, no use of accessory musculature GI: No organomegaly, abdomen is soft and non-tender, positive bowel sounds.  No masses. Skin: No rashes. Neurologic: Facial musculature symmetric. Psychiatric: Patient is appropriate throughout our interaction. Lymphatic: No cervical lymphadenopathy Musculoskeletal: Gait antalgic Back exam-decrease ROM due to pain, he has no paramsk tenderness Hip exam-full ROM in supine position, 5/5 strength, tender at great trochanteric bursa, he has pain at greater  troch with ER and IR, and pivot of hip joint standing, sensation intact No saddle anesthesia.  2/2 DTRS No inguinal hernias Knee exam nl    LABS: Results for orders placed during the hospital encounter of 08/07/11  WOUND CULTURE      Result Value Range   Specimen Description FOOT LEFT DORSAL WOUND     Special Requests NONE     Gram Stain       Value: RARE WBC PRESENT,BOTH PMN AND MONONUCLEAR     NO SQUAMOUS EPITHELIAL CELLS SEEN     NO ORGANISMS SEEN   Culture NO GROWTH 2 DAYS     Report Status 08/10/2011 FINAL    ANAEROBIC CULTURE      Result Value Range   Specimen Description FOOT LEFT DORSAL WOUND     Special Requests NONE     Gram Stain       Value: FEW WBC PRESENT,BOTH PMN AND MONONUCLEAR     NO SQUAMOUS EPITHELIAL CELLS SEEN     NO ORGANISMS SEEN   Culture NO ANAEROBES ISOLATED     Report Status 08/12/2011 FINAL    CBC      Result Value Range   WBC 5.9  4.0 - 10.5 K/uL   RBC 4.53  4.22 - 5.81  MIL/uL   Hemoglobin 13.5  13.0 - 17.0 g/dL   HCT 09.8  11.9 - 14.7 %   MCV 89.8  78.0 - 100.0 fL   MCH 29.8  26.0 - 34.0 pg   MCHC 33.2  30.0 - 36.0 g/dL   RDW 82.9  56.2 - 13.0 %  Platelets 372  150 - 400 K/uL  BASIC METABOLIC PANEL      Result Value Range   Sodium 134 (*) 135 - 145 mEq/L   Potassium 4.2  3.5 - 5.1 mEq/L   Chloride 98  96 - 112 mEq/L   CO2 31  19 - 32 mEq/L   Glucose, Bld 105 (*) 70 - 99 mg/dL   BUN 23  6 - 23 mg/dL   Creatinine, Ser 5.62  0.50 - 1.35 mg/dL   Calcium 9.2  8.4 - 13.0 mg/dL   GFR calc non Af Amer 89 (*) >90 mL/min   GFR calc Af Amer >90  >90 mL/min  HEMOGLOBIN A1C      Result Value Range   Hemoglobin A1C 5.6  <5.7 %   Mean Plasma Glucose 114  <117 mg/dL  CBC      Result Value Range   WBC 6.0  4.0 - 10.5 K/uL   RBC 4.21 (*) 4.22 - 5.81 MIL/uL   Hemoglobin 12.5 (*) 13.0 - 17.0 g/dL   HCT 86.5 (*) 78.4 - 69.6 %   MCV 90.3  78.0 - 100.0 fL   MCH 29.7  26.0 - 34.0 pg   MCHC 32.9  30.0 - 36.0 g/dL   RDW 29.5  28.4 - 13.2 %   Platelets 358  150 - 400 K/uL  BASIC METABOLIC PANEL      Result Value Range   Sodium 135  135 - 145 mEq/L   Potassium 3.9  3.5 - 5.1 mEq/L   Chloride 100  96 - 112 mEq/L   CO2 29  19 - 32 mEq/L   Glucose, Bld 121 (*) 70 - 99 mg/dL   BUN 20  6 - 23 mg/dL   Creatinine, Ser 4.40  0.50 - 1.35 mg/dL   Calcium 8.6  8.4 - 10.2 mg/dL   GFR calc non Af Amer >90  >90 mL/min   GFR calc Af Amer >90  >90 mL/min  VANCOMYCIN, TROUGH      Result Value Range   Vancomycin Tr 22.9 (*) 10.0 - 20.0 ug/mL  BASIC METABOLIC PANEL      Result Value Range   Sodium 138  135 - 145 mEq/L   Potassium 4.0  3.5 - 5.1 mEq/L   Chloride 103  96 - 112 mEq/L   CO2 30  19 - 32 mEq/L   Glucose, Bld 106 (*) 70 - 99 mg/dL   BUN 12  6 - 23 mg/dL   Creatinine, Ser 7.25  0.50 - 1.35 mg/dL   Calcium 8.6  8.4 - 36.6 mg/dL   GFR calc non Af Amer 88 (*) >90 mL/min   GFR calc Af Amer >90  >90 mL/min     EKG/XRAY:   Primary read interpreted by Dr. Conley Rolls  at [facility]. Severe DJD, L1-L4 Hip has some djd , no fx/dislocation   ASSESSMENT/PLAN: Encounter Diagnoses  Name Primary?  . Hip pain, acute, right Yes  . Back pain   . Greater trochanteric bursitis of right hip    ? etiology purely back and hip vs a component of trochanteric bursitis.  Possible back and hip pain from arthritis with increase sprain/strain due to increase work load and being morbidly obese however there is a ? Component of great troch bursitis He has pain directly at the greater trochanteric bursa and minimal radiation to inguinal area, hernia check was negative. I usually do not see greater troch pain radiating to inguinal region Conservative treatment  for now: mobic, prednisone, and oxycodone If no improvement then will refer to ortho, will make appt for end of the week since he states he needs to be back on his feet and traveling for work by next week F/u prn  Gross sideeffects, risk and benefits, and alternatives of medications d/w patient. Patient is aware that all medications have potential sideeffects and we are unable to predict every sideeffect or drug-drug interaction that may occur.  Hamilton Capri PHUONG, DO 11/20/2012 5:56 PM

## 2012-11-22 ENCOUNTER — Ambulatory Visit: Payer: Self-pay | Admitting: Physician Assistant

## 2013-01-19 ENCOUNTER — Telehealth: Payer: Self-pay | Admitting: Physician Assistant

## 2013-01-19 MED ORDER — TAMSULOSIN HCL 0.4 MG PO CAPS: 0.8000 mg | ORAL_CAPSULE | Freq: Every day | ORAL | Status: DC

## 2013-01-19 NOTE — Telephone Encounter (Signed)
Rx Refilled  

## 2013-01-19 NOTE — Telephone Encounter (Signed)
Pt is needing a refill on his Flomax Call back number [place] in [place]

## 2013-01-22 ENCOUNTER — Other Ambulatory Visit: Payer: BC Managed Care – PPO

## 2013-01-22 ENCOUNTER — Telehealth: Payer: Self-pay | Admitting: Family Medicine

## 2013-01-22 DIAGNOSIS — N401 Enlarged prostate with lower urinary tract symptoms: Secondary | ICD-10-CM

## 2013-01-22 DIAGNOSIS — E669 Obesity, unspecified: Secondary | ICD-10-CM

## 2013-01-22 DIAGNOSIS — Z79899 Other long term (current) drug therapy: Secondary | ICD-10-CM

## 2013-01-22 DIAGNOSIS — Z0000 Encounter for general adult medical examination without abnormal findings: Secondary | ICD-10-CM

## 2013-01-22 DIAGNOSIS — I10 Essential (primary) hypertension: Secondary | ICD-10-CM

## 2013-01-22 DIAGNOSIS — R351 Nocturia: Principal | ICD-10-CM

## 2013-01-22 LAB — CBC WITH DIFFERENTIAL/PLATELET
Basophils Absolute: 0.1 10*3/uL (ref 0.0–0.1)
Basophils Relative: 1 % (ref 0–1)
Eosinophils Absolute: 0.6 10*3/uL (ref 0.0–0.7)
Eosinophils Relative: 11 % — ABNORMAL HIGH (ref 0–5)
HCT: 44.8 % (ref 39.0–52.0)
HEMOGLOBIN: 15.4 g/dL (ref 13.0–17.0)
LYMPHS ABS: 1.9 10*3/uL (ref 0.7–4.0)
Lymphocytes Relative: 31 % (ref 12–46)
MCH: 30.7 pg (ref 26.0–34.0)
MCHC: 34.4 g/dL (ref 30.0–36.0)
MCV: 89.4 fL (ref 78.0–100.0)
MONOS PCT: 12 % (ref 3–12)
Monocytes Absolute: 0.7 10*3/uL (ref 0.1–1.0)
NEUTROS ABS: 2.6 10*3/uL (ref 1.7–7.7)
NEUTROS PCT: 45 % (ref 43–77)
Platelets: 208 10*3/uL (ref 150–400)
RBC: 5.01 MIL/uL (ref 4.22–5.81)
RDW: 13.8 % (ref 11.5–15.5)
WBC: 5.9 10*3/uL (ref 4.0–10.5)

## 2013-01-22 LAB — COMPLETE METABOLIC PANEL WITH GFR
ALT: 13 U/L (ref 0–53)
AST: 20 U/L (ref 0–37)
Albumin: 3.5 g/dL (ref 3.5–5.2)
Alkaline Phosphatase: 66 U/L (ref 39–117)
BILIRUBIN TOTAL: 0.8 mg/dL (ref 0.3–1.2)
BUN: 19 mg/dL (ref 6–23)
CO2: 26 meq/L (ref 19–32)
CREATININE: 0.73 mg/dL (ref 0.50–1.35)
Calcium: 9 mg/dL (ref 8.4–10.5)
Chloride: 103 mEq/L (ref 96–112)
GLUCOSE: 87 mg/dL (ref 70–99)
Potassium: 4 mEq/L (ref 3.5–5.3)
Sodium: 138 mEq/L (ref 135–145)
TOTAL PROTEIN: 6.6 g/dL (ref 6.0–8.3)

## 2013-01-22 LAB — LIPID PANEL
CHOL/HDL RATIO: 3.4 ratio
CHOLESTEROL: 155 mg/dL (ref 0–200)
HDL: 45 mg/dL (ref >39)
LDL Cholesterol: 92 mg/dL (ref 0–99)
TRIGLYCERIDES: 88 mg/dL (ref <150)
VLDL: 18 mg/dL (ref 0–40)

## 2013-01-22 LAB — TSH: TSH: 2.667 u[IU]/mL (ref 0.350–4.500)

## 2013-01-22 LAB — PSA: PSA: 0.34 ng/mL (ref <=4.00)

## 2013-01-23 ENCOUNTER — Encounter: Payer: Self-pay | Admitting: Family Medicine

## 2013-01-23 ENCOUNTER — Other Ambulatory Visit: Payer: Self-pay | Admitting: Family Medicine

## 2013-01-23 LAB — VITAMIN D 25 HYDROXY (VIT D DEFICIENCY, FRACTURES): Vit D, 25-Hydroxy: 16 ng/mL — ABNORMAL LOW (ref 30–89)

## 2013-01-23 MED ORDER — TAMSULOSIN HCL 0.4 MG PO CAPS: 0.8000 mg | ORAL_CAPSULE | Freq: Every day | ORAL | Status: DC

## 2013-01-23 NOTE — Telephone Encounter (Signed)
.  Rx Refilled - pt will need ov before further refills - will send letter

## 2013-02-06 ENCOUNTER — Ambulatory Visit (INDEPENDENT_AMBULATORY_CARE_PROVIDER_SITE_OTHER): Payer: BC Managed Care – PPO | Admitting: Family Medicine

## 2013-02-06 ENCOUNTER — Encounter: Payer: Self-pay | Admitting: Family Medicine

## 2013-02-06 VITALS — BP 140/80 | HR 80 | Temp 97.2°F | Resp 20 | Ht 70.5 in | Wt 339.0 lb

## 2013-02-06 DIAGNOSIS — Z0000 Encounter for general adult medical examination without abnormal findings: Secondary | ICD-10-CM

## 2013-02-06 DIAGNOSIS — Z23 Encounter for immunization: Secondary | ICD-10-CM

## 2013-02-06 DIAGNOSIS — M48061 Spinal stenosis, lumbar region without neurogenic claudication: Secondary | ICD-10-CM | POA: Insufficient documentation

## 2013-02-06 MED ORDER — TAMSULOSIN HCL 0.4 MG PO CAPS: 0.4000 mg | ORAL_CAPSULE | Freq: Every day | ORAL | Status: DC

## 2013-02-06 MED ORDER — ZOSTER VACCINE LIVE 19400 UNT/0.65ML ~~LOC~~ SOLR: 0.6500 mL | Freq: Once | SUBCUTANEOUS | Status: DC

## 2013-02-06 MED ORDER — GABAPENTIN 300 MG PO CAPS: 300.0000 mg | ORAL_CAPSULE | Freq: Three times a day (TID) | ORAL | Status: DC

## 2013-02-06 NOTE — Progress Notes (Signed)
Subjective:    Patient ID: Keith Watkins, male    DOB: 10-07-1945, 68 y.o.   MRN: [id number]  HPI Since last November, the patient has been given with chronic severe low back pain that radiates into his right hip and occasionally into his left hip. He has seen Dr. Gladstone Lighter hand and MRI was obtained which showed lumbar spinal stenosis at L4-L5 per the patient's report. He also has had an x-ray of the lumbar spine with the following result: 1. Considerable lumbar spondylosis and degenerative disc disease. The patient has been treated with several courses of oral prednisone along with meloxicam. He's also been given hydrocodone which he uses sparingly to treat his pain. He is interested in more long-term options and can help control his pain in his lower back which radiates into the right and left hip. He still has not had his colonoscopy. He is due for his prostate exam. He has had Pneumovax 23. He is due for Prevnar 13, his flu shot, and Zostavax. His most recent lab work is listed below: Lab on 01/22/2013  Component Date Value Range Status  . WBC 01/22/2013 5.9  4.0 - 10.5 K/uL Final  . RBC 01/22/2013 5.01  4.22 - 5.81 MIL/uL Final  . Hemoglobin 01/22/2013 15.4  13.0 - 17.0 g/dL Final  . HCT 01/22/2013 44.8  39.0 - 52.0 % Final  . MCV 01/22/2013 89.4  78.0 - 100.0 fL Final  . MCH 01/22/2013 30.7  26.0 - 34.0 pg Final  . MCHC 01/22/2013 34.4  30.0 - 36.0 g/dL Final  . RDW 01/22/2013 13.8  11.5 - 15.5 % Final  . Platelets 01/22/2013 208  150 - 400 K/uL Final  . Neutrophils Relative % 01/22/2013 45  43 - 77 % Final  . Neutro Abs 01/22/2013 2.6  1.7 - 7.7 K/uL Final  . Lymphocytes Relative 01/22/2013 31  12 - 46 % Final  . Lymphs Abs 01/22/2013 1.9  0.7 - 4.0 K/uL Final  . Monocytes Relative 01/22/2013 12  3 - 12 % Final  . Monocytes Absolute 01/22/2013 0.7  0.1 - 1.0 K/uL Final  . Eosinophils Relative 01/22/2013 11* 0 - 5 % Final  . Eosinophils Absolute 01/22/2013 0.6  0.0 - 0.7 K/uL Final  .  Basophils Relative 01/22/2013 1  0 - 1 % Final  . Basophils Absolute 01/22/2013 0.1  0.0 - 0.1 K/uL Final  . Smear Review 01/22/2013 Criteria for review not met   Final  . Cholesterol 01/22/2013 155  0 - 200 mg/dL Final   Comment: ATP III Classification:                                < 200        mg/dL        Desirable                               200 - 239     mg/dL        Borderline High                               >= 240        mg/dL        High                             .  Triglycerides 01/22/2013 88  <150 mg/dL Final  . HDL 01/22/2013 45  >39 mg/dL Final  . Total CHOL/HDL Ratio 01/22/2013 3.4   Final  . VLDL 01/22/2013 18  0 - 40 mg/dL Final  . LDL Cholesterol 01/22/2013 92  0 - 99 mg/dL Final   Comment:                            Total Cholesterol/HDL Ratio:CHD Risk                                                 Coronary Heart Disease Risk Table                                                                 Men       Women                                   1/2 Average Risk              3.4        3.3                                       Average Risk              5.0        4.4                                    2X Average Risk              9.6        7.1                                    3X Average Risk             23.4       11.0                          Use the calculated Patient Ratio above and the CHD Risk table                           to determine the patient's CHD Risk.                          ATP III Classification (LDL):                                < 100        mg/dL         Optimal  100 - 129     mg/dL         Near or Above Optimal                               130 - 159     mg/dL         Borderline High                               160 - 189     mg/dL         High                                > 190        mg/dL         Very High                             . TSH 01/22/2013 2.667  0.350 - 4.500 uIU/mL Final  . Vit D,  25-Hydroxy 01/22/2013 16* 30 - 89 ng/mL Final   Comment: This assay accurately quantifies Vitamin D, which is the sum of the                          25-Hydroxy forms of Vitamin D2 and D3.  Studies have shown that the                          optimum concentration of 25-Hydroxy Vitamin D is 30 ng/mL or higher.                           Concentrations of Vitamin D between 20 and 29 ng/mL are considered to                          be insufficient and concentrations less than 20 ng/mL are considered                          to be deficient for Vitamin D.  . Sodium 01/22/2013 138  135 - 145 mEq/L Final  . Potassium 01/22/2013 4.0  3.5 - 5.3 mEq/L Final  . Chloride 01/22/2013 103  96 - 112 mEq/L Final  . CO2 01/22/2013 26  19 - 32 mEq/L Final  . Glucose, Bld 01/22/2013 87  70 - 99 mg/dL Final  . BUN 01/22/2013 19  6 - 23 mg/dL Final  . Creat 01/22/2013 0.73  0.50 - 1.35 mg/dL Final  . Total Bilirubin 01/22/2013 0.8  0.3 - 1.2 mg/dL Final  . Alkaline Phosphatase 01/22/2013 66  39 - 117 U/L Final  . AST 01/22/2013 20  0 - 37 U/L Final  . ALT 01/22/2013 13  0 - 53 U/L Final  . Total Protein 01/22/2013 6.6  6.0 - 8.3 g/dL Final  . Albumin 01/22/2013 3.5  3.5 - 5.2 g/dL Final  . Calcium 01/22/2013 9.0  8.4 - 10.5 mg/dL Final  . GFR, Est African American 01/22/2013 >89   Final  . GFR, Est Non African American 01/22/2013 >89   Final   Comment:  The estimated GFR is a calculation valid for adults (>=45 years old)                          that uses the CKD-EPI algorithm to adjust for age and sex. It is                            not to be used for children, pregnant women, hospitalized patients,                             patients on dialysis, or with rapidly changing kidney function.                          According to the NKDEP, eGFR >89 is normal, 60-89 shows mild                          impairment, 30-59 shows moderate impairment, 15-29 shows severe                           impairment and <15 is ESRD.                             Marland Kitchen PSA 01/22/2013 0.34  <=4.00 ng/mL Final   Comment: Test Methodology: ECLIA PSA (Electrochemiluminescence Immunoassay)                                                     For PSA values from 2.5-4.0, particularly in younger men <60 years                          old, the AUA and NCCN suggest testing for % Free PSA (3515) and                          evaluation of the rate of increase in PSA (PSA velocity).   Past Medical History  Diagnosis Date  . Back pain   . Arthritis   . Hypertension   . Enlarged prostate   . Lumbar spinal stenosis    Past Surgical History  Procedure Laterality Date  . Stomach stapled    . Cholecystectomy    . Appendectomy    . Arthroscopic knee surgery    . I&d extremity  08/07/2011    Procedure: IRRIGATION AND DEBRIDEMENT EXTREMITY;  Surgeon: Wylene Simmer, MD;  Location: [facility];  Service: Orthopedics;  Laterality: Left;  irrigation and debridement left foot abscess   Current Outpatient Prescriptions on File Prior to Visit  Medication Sig Dispense Refill  . meloxicam (MOBIC) 15 MG tablet Take 1 tablet (15 mg total) by mouth daily.  30 tablet  0   No current facility-administered medications on file prior to visit.   No Known Allergies History   Social History  . Marital Status: Married    Spouse Name: N/A    Number of Children: N/A  . Years of Education: N/A   Occupational History  . Not on file.   Social History Main  Topics  . Smoking status: Never Smoker   . Smokeless tobacco: Never Used  . Alcohol Use: No  . Drug Use: No  . Sexual Activity: Not on file   Other Topics Concern  . Not on file   Social History Narrative  . No narrative on file   Family History  Problem Relation Age of Onset  . Diabetes Other   . Stroke Other   . Hypertension Other   . Coronary artery disease Other   . Heart failure Other   . Heart disease Mother   . Cancer Sister     Breast  .  Hypertension Brother   . Hypertension Maternal Grandmother   . Stroke Maternal Grandmother   . Heart disease Maternal Grandfather   . Hypertension Maternal Grandfather   . Diabetes Sister   . Heart disease Sister   . Hyperlipidemia Sister   . Hypertension Sister   . Stroke Sister        Review of Systems  All other systems reviewed and are negative.       Objective:   Physical Exam  Vitals reviewed. Constitutional: He is oriented to person, place, and time. He appears well-developed and well-nourished. No distress.  HENT:  Head: Normocephalic and atraumatic.  Right Ear: External ear normal.  Left Ear: External ear normal.  Nose: Nose normal.  Mouth/Throat: Oropharynx is clear and moist. No oropharyngeal exudate.  Eyes: Conjunctivae and EOM are normal. Pupils are equal, round, and reactive to light. Right eye exhibits no discharge. Left eye exhibits no discharge. No scleral icterus.  Neck: Normal range of motion. Neck supple. No JVD present. No tracheal deviation present. No thyromegaly present.  Cardiovascular: Normal rate, regular rhythm, normal heart sounds and intact distal pulses.  Exam reveals no gallop and no friction rub.   No murmur heard. Pulmonary/Chest: Effort normal and breath sounds normal. No stridor. No respiratory distress. He has no wheezes. He has no rales. He exhibits no tenderness.  Abdominal: Soft. Bowel sounds are normal. He exhibits no distension and no mass. There is no tenderness. There is no rebound and no guarding.  Genitourinary: Rectum normal, prostate normal and penis normal. No penile tenderness.  Musculoskeletal: Normal range of motion. He exhibits no edema and no tenderness.  Lymphadenopathy:    He has no cervical adenopathy.  Neurological: He is alert and oriented to person, place, and time. He has normal reflexes. He displays normal reflexes. No cranial nerve deficit. He exhibits normal muscle tone. Coordination normal.  Skin: Skin is warm.  No rash noted. He is not diaphoretic. No erythema. No pallor.  Psychiatric: He has a normal mood and affect. His behavior is normal. Judgment and thought content normal.          Assessment & Plan:  1. Spinal stenosis of lumbar region Start the patient on gabapentin 300 mg by mouth 3 times a day. Patient will call me in one month and let me now how this is helping. - gabapentin (NEURONTIN) 300 MG capsule; Take 1 capsule (300 mg total) by mouth 3 (three) times daily.  Dispense: 90 capsule; Refill: 3  2. Need for prophylactic vaccination and inoculation against influenza - Flu Vaccine QUAD 36+ mos IM  3. Need for prophylactic vaccination against Streptococcus pneumoniae (pneumococcus) - Pneumococcal conjugate vaccine 13-valent IM  4. Routine general medical examination at a health care facility Patient's physical exam today is significant only for morbid obesity. I recommended diet exercise and weight loss. His prostate exam  today was normal. His PSA was normal. The remainder of his lab work was exceptional except for a low vitamin D. I recommended vitamin D 2000 units by mouth daily. The patient received the flu vaccine as well as Prevnar 13. Recent prescription to his pharmacy for Zostavax. I also recommended a colonoscopy. The patient declines a colonoscopy at the present time due to the pain he's having in his back. He will call me when he wants to schedule the colonoscopy.

## 2013-03-15 ENCOUNTER — Other Ambulatory Visit: Payer: Self-pay | Admitting: Family Medicine

## 2013-03-15 MED ORDER — TAMSULOSIN HCL 0.4 MG PO CAPS: 0.4000 mg | ORAL_CAPSULE | Freq: Every day | ORAL | Status: DC

## 2013-03-15 NOTE — Telephone Encounter (Signed)
Rx Refilled  

## 2014-02-07 ENCOUNTER — Encounter: Payer: Self-pay | Admitting: Family Medicine

## 2014-02-12 ENCOUNTER — Other Ambulatory Visit: Payer: BLUE CROSS/BLUE SHIELD

## 2014-02-12 DIAGNOSIS — I10 Essential (primary) hypertension: Secondary | ICD-10-CM

## 2014-02-12 DIAGNOSIS — N400 Enlarged prostate without lower urinary tract symptoms: Secondary | ICD-10-CM

## 2014-02-12 DIAGNOSIS — Z79899 Other long term (current) drug therapy: Secondary | ICD-10-CM

## 2014-02-12 DIAGNOSIS — Z0000 Encounter for general adult medical examination without abnormal findings: Secondary | ICD-10-CM

## 2014-02-12 DIAGNOSIS — E669 Obesity, unspecified: Secondary | ICD-10-CM

## 2014-02-12 LAB — COMPLETE METABOLIC PANEL WITH GFR
ALT: 11 U/L (ref 0–53)
AST: 15 U/L (ref 0–37)
Albumin: 3.3 g/dL — ABNORMAL LOW (ref 3.5–5.2)
Alkaline Phosphatase: 67 U/L (ref 39–117)
BUN: 18 mg/dL (ref 6–23)
CO2: 26 mEq/L (ref 19–32)
Calcium: 8.9 mg/dL (ref 8.4–10.5)
Chloride: 108 mEq/L (ref 96–112)
Creat: 0.75 mg/dL (ref 0.50–1.35)
GFR, Est African American: >89 mL/min
GFR, Est Non African American: >89 mL/min
Glucose, Bld: 94 mg/dL (ref 70–99)
POTASSIUM: 4.6 meq/L (ref 3.5–5.3)
SODIUM: 143 meq/L (ref 135–145)
Total Bilirubin: 0.6 mg/dL (ref 0.2–1.2)
Total Protein: 6.1 g/dL (ref 6.0–8.3)

## 2014-02-12 LAB — CBC WITH DIFFERENTIAL/PLATELET
BASOS PCT: 1 % (ref 0–1)
Basophils Absolute: 0.1 10*3/uL (ref 0.0–0.1)
Eosinophils Absolute: 1.2 10*3/uL — ABNORMAL HIGH (ref 0.0–0.7)
Eosinophils Relative: 24 % — ABNORMAL HIGH (ref 0–5)
HCT: 43.7 % (ref 39.0–52.0)
Hemoglobin: 14.7 g/dL (ref 13.0–17.0)
LYMPHS PCT: 24 % (ref 12–46)
Lymphs Abs: 1.2 10*3/uL (ref 0.7–4.0)
MCH: 30 pg (ref 26.0–34.0)
MCHC: 33.6 g/dL (ref 30.0–36.0)
MCV: 89.2 fL (ref 78.0–100.0)
MONO ABS: 0.6 10*3/uL (ref 0.1–1.0)
MONOS PCT: 11 % (ref 3–12)
MPV: 9.8 fL (ref 8.6–12.4)
NEUTROS PCT: 40 % — AB (ref 43–77)
Neutro Abs: 2.1 10*3/uL (ref 1.7–7.7)
Platelets: 241 10*3/uL (ref 150–400)
RBC: 4.9 MIL/uL (ref 4.22–5.81)
RDW: 13.6 % (ref 11.5–15.5)
WBC: 5.2 10*3/uL (ref 4.0–10.5)

## 2014-02-12 LAB — LIPID PANEL
CHOL/HDL RATIO: 3 ratio
CHOLESTEROL: 119 mg/dL (ref 0–200)
HDL: 40 mg/dL (ref >39)
LDL Cholesterol: 64 mg/dL (ref 0–99)
Triglycerides: 77 mg/dL (ref <150)
VLDL: 15 mg/dL (ref 0–40)

## 2014-02-12 LAB — TSH: TSH: 1.925 u[IU]/mL (ref 0.350–4.500)

## 2014-02-13 LAB — PSA: PSA: 0.37 ng/mL (ref <=4.00)

## 2014-02-14 ENCOUNTER — Ambulatory Visit (INDEPENDENT_AMBULATORY_CARE_PROVIDER_SITE_OTHER): Payer: BLUE CROSS/BLUE SHIELD | Admitting: Family Medicine

## 2014-02-14 ENCOUNTER — Encounter: Payer: Self-pay | Admitting: Family Medicine

## 2014-02-14 VITALS — BP 128/78 | HR 84 | Temp 98.1°F | Resp 18 | Ht 70.5 in | Wt 337.0 lb

## 2014-02-14 DIAGNOSIS — Z0000 Encounter for general adult medical examination without abnormal findings: Secondary | ICD-10-CM

## 2014-02-14 DIAGNOSIS — Z23 Encounter for immunization: Secondary | ICD-10-CM

## 2014-02-14 MED ORDER — ZOSTER VACCINE LIVE 19400 UNT/0.65ML ~~LOC~~ SOLR: 0.6500 mL | Freq: Once | SUBCUTANEOUS | Status: DC

## 2014-02-14 NOTE — Addendum Note (Signed)
Addended by: Shary Decamp B on: 02/14/2014 09:00 AM   Modules accepted: Orders

## 2014-02-14 NOTE — Progress Notes (Signed)
Subjective:    Patient ID: Keith Watkins, male    DOB: 1945-10-22, 69 y.o.   MRN: [id number]  HPI Patient is here today for complete physical exam. He is due for colonoscopy. Patient's immunizations are up-to-date. He has had his pneumonia vaccine including Pneumovax and Prevnar. He got his flu shot today. His tetanus shot is up-to-date. We discussed the shingles vaccine. We sent the shingles vaccine to [facility] for the patient. Flomax is not helping his nocturia. He felt that the patient may have an element of overactive bladder. Concerning week, the patient has a ringworm-like rash on his back and on his chest. The area on his back is approximately 6 cm in diameter. There are 2 areas on his chest which are each about 1.5 cm in diameter. Patient is unsure how long these areas have been there. His lab work is also significant for severe eosinophilia with the eosinophil count of 24. Patient denies any recent international travel. He denies any diarrhea or symptoms of parasitic infection. He denies any allergies. Lab on 02/12/2014  Component Date Value Ref Range Status  . Sodium 02/12/2014 143  135 - 145 mEq/L Final  . Potassium 02/12/2014 4.6  3.5 - 5.3 mEq/L Final  . Chloride 02/12/2014 108  96 - 112 mEq/L Final  . CO2 02/12/2014 26  19 - 32 mEq/L Final  . Glucose, Bld 02/12/2014 94  70 - 99 mg/dL Final  . BUN 02/12/2014 18  6 - 23 mg/dL Final  . Creat 02/12/2014 0.75  0.50 - 1.35 mg/dL Final  . Total Bilirubin 02/12/2014 0.6  0.2 - 1.2 mg/dL Final  . Alkaline Phosphatase 02/12/2014 67  39 - 117 U/L Final  . AST 02/12/2014 15  0 - 37 U/L Final  . ALT 02/12/2014 11  0 - 53 U/L Final  . Total Protein 02/12/2014 6.1  6.0 - 8.3 g/dL Final  . Albumin 02/12/2014 3.3* 3.5 - 5.2 g/dL Final  . Calcium 02/12/2014 8.9  8.4 - 10.5 mg/dL Final  . GFR, Est African American 02/12/2014 >89   Final  . GFR, Est Non African American 02/12/2014 >89   Final   Comment:   The estimated GFR is a calculation valid for  adults (>=61 years old) that uses the CKD-EPI algorithm to adjust for age and sex. It is   not to be used for children, pregnant women, hospitalized patients,    patients on dialysis, or with rapidly changing kidney function. According to the NKDEP, eGFR >89 is normal, 60-89 shows mild impairment, 30-59 shows moderate impairment, 15-29 shows severe impairment and <15 is ESRD.     . TSH 02/12/2014 1.925  0.350 - 4.500 uIU/mL Final  . Cholesterol 02/12/2014 119  0 - 200 mg/dL Final   Comment: ATP III Classification:       < 200        mg/dL        Desirable      200 - 239     mg/dL        Borderline High      >= 240        mg/dL        High     . Triglycerides 02/12/2014 77  <150 mg/dL Final  . HDL 02/12/2014 40  >39 mg/dL Final  . Total CHOL/HDL Ratio 02/12/2014 3.0   Final  . VLDL 02/12/2014 15  0 - 40 mg/dL Final  . LDL Cholesterol 02/12/2014 64  0 -  99 mg/dL Final   Comment:   Total Cholesterol/HDL Ratio:CHD Risk                        Coronary Heart Disease Risk Table                                        Men       Women          1/2 Average Risk              3.4        3.3              Average Risk              5.0        4.4           2X Average Risk              9.6        7.1           3X Average Risk             23.4       11.0 Use the calculated Patient Ratio above and the CHD Risk table  to determine the patient's CHD Risk. ATP III Classification (LDL):       < 100        mg/dL         Optimal      100 - 129     mg/dL         Near or Above Optimal      130 - 159     mg/dL         Borderline High      160 - 189     mg/dL         High       > 190        mg/dL         Very High     . WBC 02/12/2014 5.2  4.0 - 10.5 K/uL Final  . RBC 02/12/2014 4.90  4.22 - 5.81 MIL/uL Final  . Hemoglobin 02/12/2014 14.7  13.0 - 17.0 g/dL Final  . HCT 02/12/2014 43.7  39.0 - 52.0 % Final  . MCV 02/12/2014 89.2  78.0 - 100.0 fL Final  . MCH 02/12/2014 30.0  26.0 - 34.0 pg Final  .  MCHC 02/12/2014 33.6  30.0 - 36.0 g/dL Final  . RDW 02/12/2014 13.6  11.5 - 15.5 % Final  . Platelets 02/12/2014 241  150 - 400 K/uL Final  . MPV 02/12/2014 9.8  8.6 - 12.4 fL Final  . Neutrophils Relative % 02/12/2014 40* 43 - 77 % Final  . Neutro Abs 02/12/2014 2.1  1.7 - 7.7 K/uL Final  . Lymphocytes Relative 02/12/2014 24  12 - 46 % Final  . Lymphs Abs 02/12/2014 1.2  0.7 - 4.0 K/uL Final  . Monocytes Relative 02/12/2014 11  3 - 12 % Final  . Monocytes Absolute 02/12/2014 0.6  0.1 - 1.0 K/uL Final  . Eosinophils Relative 02/12/2014 24* 0 - 5 % Final  . Eosinophils Absolute 02/12/2014 1.2* 0.0 - 0.7 K/uL Final  . Basophils Relative 02/12/2014 1  0 - 1 % Final  . Basophils Absolute 02/12/2014 0.1  0.0 - 0.1 K/uL  Final  . Smear Review 02/12/2014 Criteria for review not met   Final  . PSA 02/12/2014 0.37  <=4.00 ng/mL Final   Comment: Test Methodology: ECLIA PSA (Electrochemiluminescence Immunoassay)   For PSA values from 2.5-4.0, particularly in younger men <59 years old, the AUA and NCCN suggest testing for % Free PSA (3515) and evaluation of the rate of increase in PSA (PSA velocity).    Past Medical History  Diagnosis Date  . Back pain   . Arthritis   . Hypertension   . Enlarged prostate   . Lumbar spinal stenosis    Past Surgical History  Procedure Laterality Date  . Stomach stapled    . Cholecystectomy    . Appendectomy    . Arthroscopic knee surgery    . I&d extremity  08/07/2011    Procedure: IRRIGATION AND DEBRIDEMENT EXTREMITY;  Surgeon: Wylene Simmer, MD;  Location: [facility];  Service: Orthopedics;  Laterality: Left;  irrigation and debridement left foot abscess   Current Outpatient Prescriptions on File Prior to Visit  Medication Sig Dispense Refill  . gabapentin (NEURONTIN) 300 MG capsule Take 1 capsule (300 mg total) by mouth 3 (three) times daily. (Patient not taking: Reported on 02/14/2014) 90 capsule 3  . tamsulosin (FLOMAX) 0.4 MG CAPS capsule Take 1 capsule (0.4  mg total) by mouth at bedtime. 90 capsule 3   No current facility-administered medications on file prior to visit.   No Known Allergies History   Social History  . Marital Status: Married    Spouse Name: N/A  . Number of Children: N/A  . Years of Education: N/A   Occupational History  . Not on file.   Social History Main Topics  . Smoking status: Never Smoker   . Smokeless tobacco: Never Used  . Alcohol Use: No  . Drug Use: No  . Sexual Activity: Not on file   Other Topics Concern  . Not on file   Social History Narrative   Family History  Problem Relation Age of Onset  . Diabetes Other   . Stroke Other   . Hypertension Other   . Coronary artery disease Other   . Heart failure Other   . Heart disease Mother   . Cancer Sister     Breast  . Hypertension Brother   . Hypertension Maternal Grandmother   . Stroke Maternal Grandmother   . Heart disease Maternal Grandfather   . Hypertension Maternal Grandfather   . Diabetes Sister   . Heart disease Sister   . Hyperlipidemia Sister   . Hypertension Sister   . Stroke Sister       Review of Systems  All other systems reviewed and are negative.      Objective:   Physical Exam  Constitutional: He is oriented to person, place, and time. He appears well-developed and well-nourished. No distress.  HENT:  Head: Normocephalic and atraumatic.  Right Ear: External ear normal.  Left Ear: External ear normal.  Nose: Nose normal.  Mouth/Throat: Oropharynx is clear and moist. No oropharyngeal exudate.  Eyes: Conjunctivae and EOM are normal. Pupils are equal, round, and reactive to light. Right eye exhibits no discharge. Left eye exhibits no discharge. No scleral icterus.  Neck: Normal range of motion. Neck supple. No JVD present. No tracheal deviation present. No thyromegaly present.  Cardiovascular: Normal rate, regular rhythm, normal heart sounds and intact distal pulses.  Exam reveals no gallop and no friction rub.     No murmur heard. Pulmonary/Chest: Effort  normal and breath sounds normal. No stridor. No respiratory distress. He has no wheezes. He has no rales. He exhibits no tenderness.  Abdominal: Soft. Bowel sounds are normal. He exhibits no distension and no mass. There is no tenderness. There is no rebound and no guarding.  Musculoskeletal: Normal range of motion. He exhibits no edema or tenderness.  Lymphadenopathy:    He has no cervical adenopathy.  Neurological: He is alert and oriented to person, place, and time. He has normal reflexes. He displays normal reflexes. No cranial nerve deficit. He exhibits normal muscle tone. Coordination normal.  Skin: Rash noted. He is not diaphoretic. There is erythema.  Psychiatric: He has a normal mood and affect. His behavior is normal. Judgment and thought content normal.  Vitals reviewed.         Assessment & Plan:  Routine general medical examination at a health care facility  Patient's physical exam is significant for morbid obesity. He also has a rash that is consistent either with tinea, granuloma annulare are, lichen planus. I am concerned this rash may be related to his eosinophilia. Therefore I've asked the patient to return tomorrow for an excisional biopsy to rule out mastocytosis. If the biopsy reveals no specific cause for the rash, the next step would be to perform stool cultures for O&P to evaluate for parasitic infections. If this is negative I would refer the patient to hematology to discuss other possible workup.  I recommended diet exercise and weight loss. I will schedule the patient for colonoscopy. I've also recommended a referral to a dentist as the patient has very poor dentition which may increase his risk of cardiovascular disease. Patient was given a flu shot today. We also sent a prescription for the shingles shot to a local pharmacy. I believe the patient may have overactive bladder rather than BPH. I would consider switching the  patient to Vesicare once we have determined the cause of his eosinophilia.

## 2014-02-15 ENCOUNTER — Ambulatory Visit (INDEPENDENT_AMBULATORY_CARE_PROVIDER_SITE_OTHER): Payer: BLUE CROSS/BLUE SHIELD | Admitting: Family Medicine

## 2014-02-15 ENCOUNTER — Encounter: Payer: Self-pay | Admitting: Family Medicine

## 2014-02-15 DIAGNOSIS — D485 Neoplasm of uncertain behavior of skin: Secondary | ICD-10-CM

## 2014-02-15 DIAGNOSIS — D721 Eosinophilia, unspecified: Secondary | ICD-10-CM

## 2014-02-15 NOTE — Progress Notes (Signed)
   Subjective:    Patient ID: Keith Watkins, male    DOB: 06/10/1945, 69 y.o.   MRN: [id number]  HPI Please see my office visit from yesterday, the patient is here today for an excisional biopsy of the lesion on his back. The area was anesthetized with 0.1% lidocaine with epinephrine. The patient was then prepped and draped in sterile fashion. I performed a 1.5 x 1.5 elliptical excision down to the subcutaneous fascia. The skin was then approximated using 3 simple interrupted 3-0 Ethilon sutures. Due to the patient's body habitus I reinforced the skin with 2 horizontal mattress sutures. The wound was then dressed. The lesion was sent to pathology and labeled container. Also recommended that the patient see a hematologist. Given the magnitude of his eosinophilia, I am concerned about a myeloproliferative disorder. Therefore I will schedule the patient to see a hematologist.   Review of Systems  All other systems reviewed and are negative.      Objective:   Physical Exam  Cardiovascular: Normal rate and regular rhythm.   Pulmonary/Chest: Effort normal and breath sounds normal.          Assessment & Plan:  Neoplasm of uncertain behavior of skin - Plan: Pathology  Eosinophilia - Plan: Ambulatory referral to Hematology  Please see my office visit from yesterday, the patient is here today for an excisional biopsy of the lesion on his back. The area was anesthetized with 0.1% lidocaine with epinephrine. The patient was then prepped and draped in sterile fashion. I performed a 1.5 x 1.5 elliptical excision down to the subcutaneous fascia. The skin was then approximated using 3 simple interrupted 3-0 Ethilon sutures. Due to the patient's body habitus I reinforced the skin with 2 horizontal mattress sutures. The wound was then dressed. The lesion was sent to pathology and labeled container. Also recommended that the patient see a hematologist. Given the magnitude of his eosinophilia, I am concerned  about a myeloproliferative disorder. Therefore I will schedule the patient to see a hematologist.

## 2014-02-19 ENCOUNTER — Telehealth: Payer: Self-pay | Admitting: Hematology and Oncology

## 2014-02-19 NOTE — Telephone Encounter (Signed)
lt mess regarding appt on 03/12/14 at 9:30 am w/ Alvy Bimler Referring: Dr. Dennard Schaumann

## 2014-02-20 ENCOUNTER — Encounter: Payer: Self-pay | Admitting: Internal Medicine

## 2014-02-20 LAB — PATHOLOGY

## 2014-02-21 ENCOUNTER — Telehealth: Payer: Self-pay | Admitting: Hematology

## 2014-02-21 NOTE — Telephone Encounter (Signed)
Pt confirmed appt for 03/18/14 at 10:30 w/Feng

## 2014-02-25 ENCOUNTER — Ambulatory Visit (AMBULATORY_SURGERY_CENTER): Payer: Self-pay | Admitting: *Deleted

## 2014-02-25 ENCOUNTER — Ambulatory Visit (INDEPENDENT_AMBULATORY_CARE_PROVIDER_SITE_OTHER): Payer: BLUE CROSS/BLUE SHIELD | Admitting: Family Medicine

## 2014-02-25 ENCOUNTER — Encounter: Payer: Self-pay | Admitting: Family Medicine

## 2014-02-25 VITALS — BP 128/74 | HR 80 | Temp 98.1°F | Resp 18 | Ht 70.5 in | Wt 337.0 lb

## 2014-02-25 VITALS — Ht 72.0 in | Wt 332.8 lb

## 2014-02-25 DIAGNOSIS — N3281 Overactive bladder: Secondary | ICD-10-CM

## 2014-02-25 DIAGNOSIS — Z1211 Encounter for screening for malignant neoplasm of colon: Secondary | ICD-10-CM

## 2014-02-25 MED ORDER — MOVIPREP 100 G PO SOLR: ORAL | Status: DC

## 2014-02-25 NOTE — Progress Notes (Signed)
No egg or soy allergy  No anesthesia or intubation problems per pt  No diet medications taken   

## 2014-02-25 NOTE — Progress Notes (Signed)
   Subjective:    Patient ID: Keith Watkins, male    DOB: 17-Sep-1945, 69 y.o.   MRN: [id number]  HPI 02/15/14 Please see my office visit from yesterday, the patient is here today for an excisional biopsy of the lesion on his back. The area was anesthetized with 0.1% lidocaine with epinephrine. The patient was then prepped and draped in sterile fashion. I performed a 1.5 x 1.5 elliptical excision down to the subcutaneous fascia. The skin was then approximated using 3 simple interrupted 3-0 Ethilon sutures. Due to the patient's body habitus I reinforced the skin with 2 horizontal mattress sutures. The wound was then dressed. The lesion was sent to pathology and labeled container. Also recommended that the patient see a hematologist. Given the magnitude of his eosinophilia, I am concerned about a myeloproliferative disorder. Therefore I will schedule the patient to see a hematologist.  At that time, my plan was: Please see my office visit from yesterday, the patient is here today for an excisional biopsy of the lesion on his back. The area was anesthetized with 0.1% lidocaine with epinephrine. The patient was then prepped and draped in sterile fashion. I performed a 1.5 x 1.5 elliptical excision down to the subcutaneous fascia. The skin was then approximated using 3 simple interrupted 3-0 Ethilon sutures. Due to the patient's body habitus I reinforced the skin with 2 horizontal mattress sutures. The wound was then dressed. The lesion was sent to pathology and labeled container. Also recommended that the patient see a hematologist. Given the magnitude of his eosinophilia, I am concerned about a myeloproliferative disorder. Therefore I will schedule the patient to see a hematologist.  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 The patient's sutures were removed without difficulty. The wound was then covered with Steri-Strips and wound care was discussed. I explained granuloma annulare.  Patient's Flomax does  not help his urinary symptoms. Instead the patient has polyuria, urgency incontinence, and nocturia.   Review of Systems  All other systems reviewed and are negative.      Objective:   Physical Exam  Cardiovascular: Normal rate and regular rhythm.   Pulmonary/Chest: Effort normal and breath sounds normal.          Assessment & Plan:  OAB (overactive bladder)  Try vesicare 10 mg poqday and recheck in 2 weeks.

## 2014-03-05 ENCOUNTER — Telehealth: Payer: Self-pay | Admitting: Family Medicine

## 2014-03-05 MED ORDER — TAMSULOSIN HCL 0.4 MG PO CAPS: 0.4000 mg | ORAL_CAPSULE | Freq: Every day | ORAL | Status: DC

## 2014-03-05 NOTE — Telephone Encounter (Signed)
Pt called and states that the vesicare is not working as well as the flomax was and was wondering what else to do now? Per [facility] can go back on flomax that it is not OAB it is his prostate. If he get no relief with the flomax we can add Finastride. Pt aware of all and med sent to pharm and he will call back if flomax no help.

## 2014-03-08 ENCOUNTER — Encounter: Payer: Self-pay | Admitting: Internal Medicine

## 2014-03-08 ENCOUNTER — Ambulatory Visit (AMBULATORY_SURGERY_CENTER): Payer: BLUE CROSS/BLUE SHIELD | Admitting: Internal Medicine

## 2014-03-08 VITALS — BP 110/74 | HR 69 | Temp 96.1°F | Resp 16 | Ht 72.0 in | Wt 332.0 lb

## 2014-03-08 DIAGNOSIS — Z1211 Encounter for screening for malignant neoplasm of colon: Secondary | ICD-10-CM

## 2014-03-08 MED ORDER — SODIUM CHLORIDE 0.9 % IV SOLN: 500.0000 mL | INTRAVENOUS | Status: DC

## 2014-03-08 NOTE — Patient Instructions (Signed)
Discharge instructions given. Handouts on diverticulosis and a high fiber diet Resume previous medications. YOU HAD AN ENDOSCOPIC PROCEDURE TODAY AT THE [facility]:   Refer to the procedure report that was given to you for any specific questions about what was found during the examination.  If the procedure report does not answer your questions, please call your gastroenterologist to clarify.  If you requested that your care partner not be given the details of your procedure findings, then the procedure report has been included in a sealed envelope for you to review at your convenience later.  YOU SHOULD EXPECT: Some feelings of bloating in the abdomen. Passage of more gas than usual.  Walking can help get rid of the air that was put into your GI tract during the procedure and reduce the bloating. If you had a lower endoscopy (such as a colonoscopy or flexible sigmoidoscopy) you may notice spotting of blood in your stool or on the toilet paper. If you underwent a bowel prep for your procedure, you may not have a normal bowel movement for a few days.  Please Note:  You might notice some irritation and congestion in your nose or some drainage.  This is from the oxygen used during your procedure.  There is no need for concern and it should clear up in a day or so.  SYMPTOMS TO REPORT IMMEDIATELY:   Following lower endoscopy (colonoscopy or flexible sigmoidoscopy):  Excessive amounts of blood in the stool  Significant tenderness or worsening of abdominal pains  Swelling of the abdomen that is new, acute  Fever of 100F or higher   For urgent or emergent issues, a gastroenterologist can be reached at any hour by calling [phone].   DIET: Your first meal following the procedure should be a small meal and then it is ok to progress to your normal diet. Heavy or fried foods are harder to digest and may make you feel nauseous or bloated.  Likewise, meals heavy in dairy and vegetables  can increase bloating.  Drink plenty of fluids but you should avoid alcoholic beverages for 24 hours.  ACTIVITY:  You should plan to take it easy for the rest of today and you should NOT DRIVE or use heavy machinery until tomorrow (because of the sedation medicines used during the test).    FOLLOW UP: Our staff will call the number listed on your records the next business day following your procedure to check on you and address any questions or concerns that you may have regarding the information given to you following your procedure. If we do not reach you, we will leave a message.  However, if you are feeling well and you are not experiencing any problems, there is no need to return our call.  We will assume that you have returned to your regular daily activities without incident.  If any biopsies were taken you will be contacted by phone or by letter within the next 1-3 weeks.  Please call us at [phone] if you have not heard about the biopsies in 3 weeks.    SIGNATURES/CONFIDENTIALITY: You and/or your care partner have signed paperwork which will be entered into your electronic medical record.  These signatures attest to the fact that that the information above on your After Visit Summary has been reviewed and is understood.  Full responsibility of the confidentiality of this discharge information lies with you and/or your care-partner. 

## 2014-03-08 NOTE — Progress Notes (Signed)
To recovery, report to [facility], Therapist, sports. VSS

## 2014-03-08 NOTE — Op Note (Signed)
[facility]  [address] [place], [id number]   COLONOSCOPY PROCEDURE REPORT  PATIENT: Keith Watkins, Keith Watkins  MR#: [id number] BIRTHDATE: 11/01/1945 , 68  yrs. old GENDER: male ENDOSCOPIST: Eustace Quail, MD REFERRED ST:[id number] Dennard Schaumann, M.D. PROCEDURE DATE:  03/08/2014 PROCEDURE:   Colonoscopy, screening First Screening Colonoscopy - Avg.  risk and is 50 yrs.  old or older Yes.  Prior Negative Screening - Now for repeat screening. N/A  History of Adenoma - Now for follow-up colonoscopy & has been > or = to 3 yrs.  N/A  Polyps Removed Today? No.  Polyps Removed Today? No.  Recommend repeat exam, <10 yrs? No. ASA CLASS:   Class II INDICATIONS:average risk patient for colorectal cancer. MEDICATIONS: Monitored anesthesia care and Propofol 310 mg IV  DESCRIPTION OF PROCEDURE:   After the risks benefits and alternatives of the procedure were thoroughly explained, informed consent was obtained.  The digital rectal exam revealed no abnormalities of the rectum.   The [id number]  endoscope was introduced through the anus and advanced to the cecum, which was identified by both the appendix and ileocecal valve. No adverse events experienced.   The quality of the prep was excellent, using MoviPrep  The instrument was then slowly withdrawn as the colon was fully examined.    COLON FINDINGS: There was moderate diverticulosis noted throughout the entire examined colon.   The examination was otherwise normal. Retroflexed views revealed internal hemorrhoids. The time to cecum=1 minutes 34 seconds.  Withdrawal time=10 minutes 49 seconds. The scope was withdrawn and the procedure completed.  COMPLICATIONS: There were no immediate complications.  ENDOSCOPIC IMPRESSION: 1.   Moderate diverticulosis was noted throughout the entire examined colon 2.   The examination was otherwise normal  RECOMMENDATIONS: 1. Continue current colorectal screening recommendations for "routine  risk" patients with a repeat colonoscopy in 10 years.  eSigned:  Eustace Quail, MD 03/08/2014 9:15 AM   cc: Jenna Luo, MD and The Patient

## 2014-03-11 ENCOUNTER — Telehealth: Payer: Self-pay

## 2014-03-11 NOTE — Telephone Encounter (Signed)
Left message on answering machine. 

## 2014-03-12 ENCOUNTER — Ambulatory Visit: Payer: Self-pay

## 2014-03-12 ENCOUNTER — Ambulatory Visit: Payer: Self-pay | Admitting: Hematology and Oncology

## 2014-03-18 ENCOUNTER — Ambulatory Visit: Payer: BLUE CROSS/BLUE SHIELD

## 2014-03-18 ENCOUNTER — Telehealth: Payer: Self-pay | Admitting: Hematology

## 2014-03-18 ENCOUNTER — Encounter: Payer: Self-pay | Admitting: Hematology

## 2014-03-18 ENCOUNTER — Other Ambulatory Visit (HOSPITAL_COMMUNITY)
Admission: RE | Admit: 2014-03-18 | Discharge: 2014-03-18 | Disposition: A | Discharge status: Home / Self Care | Payer: BLUE CROSS/BLUE SHIELD | Source: Ambulatory Visit | Attending: Hematology | Admitting: Hematology

## 2014-03-18 ENCOUNTER — Other Ambulatory Visit: Payer: Self-pay | Admitting: *Deleted

## 2014-03-18 ENCOUNTER — Ambulatory Visit (HOSPITAL_BASED_OUTPATIENT_CLINIC_OR_DEPARTMENT_OTHER): Payer: BLUE CROSS/BLUE SHIELD | Admitting: Hematology

## 2014-03-18 VITALS — BP 119/76 | HR 85 | Temp 97.9°F | Resp 20 | Ht 72.0 in | Wt 328.0 lb

## 2014-03-18 DIAGNOSIS — D72119 Hypereosinophilic syndrome (hes), unspecified: Secondary | ICD-10-CM

## 2014-03-18 DIAGNOSIS — D721 Eosinophilia, unspecified: Secondary | ICD-10-CM

## 2014-03-18 LAB — CBC & DIFF AND RETIC
BASO%: 1.4 % (ref 0.0–2.0)
Basophils Absolute: 0.1 10*3/uL (ref 0.0–0.1)
EOS%: 22.7 % — ABNORMAL HIGH (ref 0.0–7.0)
Eosinophils Absolute: 1.3 10*3/uL — ABNORMAL HIGH (ref 0.0–0.5)
HEMATOCRIT: 44.4 % (ref 38.4–49.9)
HEMOGLOBIN: 15 g/dL (ref 13.0–17.1)
IMMATURE RETIC FRACT: 2.3 % — AB (ref 3.00–10.60)
LYMPH%: 23.8 % (ref 14.0–49.0)
MCH: 30.9 pg (ref 27.2–33.4)
MCHC: 33.8 g/dL (ref 32.0–36.0)
MCV: 91.4 fL (ref 79.3–98.0)
MONO#: 0.7 10*3/uL (ref 0.1–0.9)
MONO%: 11.7 % (ref 0.0–14.0)
NEUT%: 40.4 % (ref 39.0–75.0)
NEUTROS ABS: 2.2 10*3/uL (ref 1.5–6.5)
Platelets: 191 10*3/uL (ref 140–400)
RBC: 4.86 10*6/uL (ref 4.20–5.82)
RDW: 13.4 % (ref 11.0–14.6)
Retic %: 0.96 % (ref 0.80–1.80)
Retic Ct Abs: 46.66 10*3/uL (ref 34.80–93.90)
WBC: 5.5 10*3/uL (ref 4.0–10.3)
lymph#: 1.3 10*3/uL (ref 0.9–3.3)
nRBC: 0 % (ref 0–0)

## 2014-03-18 LAB — COMPREHENSIVE METABOLIC PANEL (CC13)
ALBUMIN: 3.4 g/dL — AB (ref 3.5–5.0)
ALK PHOS: 80 U/L (ref 40–150)
ALT: 15 U/L (ref 0–55)
AST: 18 U/L (ref 5–34)
Anion Gap: 8 mEq/L (ref 3–11)
BUN: 14.3 mg/dL (ref 7.0–26.0)
CO2: 26 mEq/L (ref 22–29)
Calcium: 9.4 mg/dL (ref 8.4–10.4)
Chloride: 106 mEq/L (ref 98–109)
Creatinine: 0.8 mg/dL (ref 0.7–1.3)
Glucose: 100 mg/dl (ref 70–140)
POTASSIUM: 4 meq/L (ref 3.5–5.1)
SODIUM: 140 meq/L (ref 136–145)
Total Bilirubin: 1.12 mg/dL (ref 0.20–1.20)
Total Protein: 6.8 g/dL (ref 6.4–8.3)

## 2014-03-18 LAB — LACTATE DEHYDROGENASE (CC13): LDH: 193 U/L (ref 125–245)

## 2014-03-18 NOTE — Progress Notes (Signed)
Checked in new pt with no financial concerns at this time.  Pt states he's here for a hematology concern so financial assistance may not be needed but he has [employer]quel's card for any billing questions or concerns.

## 2014-03-18 NOTE — Progress Notes (Signed)
[place]  Telephone:(336) [phone] Fax:(336) [place] Note   Patient Care Team: Susy Frizzle, MD as PCP - General (Family Medicine) 03/18/2014  CHIEF COMPLAINTS/PURPOSE OF CONSULTATION:  Eosinophilia   REFERRAL PHYSICIAN: Dr. Dennard Schaumann   HISTORY OF PRESENTING ILLNESS:  Keith Watkins 69 y.o. male is here because of elevated blood eosinophils counts.   He has had scatter skin rash for one year, it looks like ring worm, but it does not bother him, no itchiness or pain, it has not changed significantly lately.. No other complains. He frequently travels internationally, including [place] and [place]. He does have arthritis pain which is chronic and stable. No chest pain, dyspnea or there complains. He has fair appetite and energy level. He works 7 days a week. No weight loss.   He had routine physical exam with his primary care physician Dr. Cindi Carbon and had CBC done on 01/11/2013 w hich showed a WBC 5.2, absolute neutrophil 4.9, absolute lymphocytes 1.2, absolute eosinophil 1.2, hemoglobin 14.7, platelet count 241K. He underwent a punch skin biopsy by Dr. 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   MEDICAL HISTORY:  Past Medical History  Diagnosis Date  . Back pain   . Arthritis   . Enlarged prostate   . Lumbar spinal stenosis   . Cataract   . Hypertension     prior to gastric bypass- no meds taken    SURGICAL HISTORY: Past Surgical History  Procedure Laterality Date  . Stomach stapled    . Cholecystectomy    . Appendectomy    . Arthroscopic knee surgery    . I&d extremity  08/07/2011    Procedure: IRRIGATION AND DEBRIDEMENT EXTREMITY;  Surgeon: Wylene Simmer, MD;  Location: [facility];  Service: Orthopedics;  Laterality: Left;  irrigation and debridement left foot abscess    SOCIAL HISTORY: History   Social History  . Marital Status: Married    Spouse Name: N/A  . Number of Children: 3  . Years of Education: N/A    Occupational History  . He works for a [employer], clinical    Social History Main Topics  . Smoking status: Never Smoker   . Smokeless tobacco: Never Used  . Alcohol Use: No  . Drug Use: No  . Sexual Activity: Not on file   Other Topics Concern  . Not on file   Social History Narrative    FAMILY HISTORY: Family History  Problem Relation Age of Onset  . Diabetes Other   . Stroke Other   . Hypertension Other   . Coronary artery disease Other   . Heart failure Other   . Heart disease Mother   . Cancer Sister     Breast  . Breast cancer Sister   . Hypertension Brother   . Hypertension Maternal Grandmother   . Stroke Maternal Grandmother   . Heart disease Maternal Grandfather   . Hypertension Maternal Grandfather   . Diabetes Sister   . Heart disease Sister   . Hyperlipidemia Sister   . Hypertension Sister   . Stroke Sister   . Colon cancer Neg Hx   . Esophageal cancer Neg Hx   . Stomach cancer Neg Hx   . Rectal cancer Neg Hx   No other FH of malignancy or blood disorders  ALLERGIES:  has No Known Allergies.  MEDICATIONS:  Current Outpatient Prescriptions  Medication Sig Dispense Refill  . tamsulosin (FLOMAX) 0.4 MG CAPS capsule Take 1 capsule (0.4  mg total) by mouth at bedtime. 90 capsule 3   No current facility-administered medications for this visit.    REVIEW OF SYSTEMS:   Constitutional: Denies fevers, chills or abnormal night sweats Eyes: Denies blurriness of vision, double vision or watery eyes Ears, nose, mouth, throat, and face: Denies mucositis or sore throat Respiratory: Denies cough, dyspnea or wheezes Cardiovascular: Denies palpitation, chest discomfort or lower extremity swelling Gastrointestinal:  Denies nausea, heartburn or change in bowel habits Skin: (+) skin rashes  Lymphatics: Denies new lymphadenopathy or easy bruising Neurological:Denies numbness, tingling or new weaknesses Behavioral/Psych: Mood is stable, no new changes  All other  systems were reviewed with the patient and are negative.  PHYSICAL EXAMINATION: ECOG PERFORMANCE STATUS: 1 - Symptomatic but completely ambulatory  Filed Vitals:   03/18/14 1029  BP: 119/76  Pulse: 85  Temp: 97.9 F (36.6 C)  Resp: 20   Filed Weights   03/18/14 1029  Weight: 328 lb (148.78 kg)    GENERAL:alert, no distress and comfortable SKIN: skin color, texture, turgor are normal,  there are several papular skin rash with central pale, measuring a few mm to 1.5cm on his back and arms. EYES: normal, conjunctiva are pink and non-injected, sclera clear OROPHARYNX:no exudate, no erythema and lips, buccal mucosa, and tongue normal  NECK: supple, thyroid normal size, non-tender, without nodularity LYMPH:  no palpable lymphadenopathy in the cervical, axillary or inguinal LUNGS: clear to auscultation and percussion with normal breathing effort HEART: regular rate & rhythm and no murmurs and no lower extremity edema ABDOMEN:abdomen soft, non-tender and normal bowel sounds Musculoskeletal:no cyanosis of digits and no clubbing  PSYCH: alert & oriented x 3 with fluent speech NEURO: no focal motor/sensory deficits  LABORATORY DATA:  I have reviewed the data as listed CBC    Component Value Date/Time   WBC 5.5 03/18/2014 1204   WBC 5.2 02/12/2014 0829   RBC 4.86 03/18/2014 1204   RBC 4.90 02/12/2014 0829   HGB 15.0 03/18/2014 1204   HGB 14.7 02/12/2014 0829   HCT 44.4 03/18/2014 1204   HCT 43.7 02/12/2014 0829   PLT 191 03/18/2014 1204   PLT 241 02/12/2014 0829   MCV 91.4 03/18/2014 1204   MCV 89.2 02/12/2014 0829   MCH 30.9 03/18/2014 1204   MCH 30.0 02/12/2014 0829   MCHC 33.8 03/18/2014 1204   MCHC 33.6 02/12/2014 0829   RDW 13.4 03/18/2014 1204   RDW 13.6 02/12/2014 0829   LYMPHSABS 1.3 03/18/2014 1204   LYMPHSABS 1.2 02/12/2014 0829   MONOABS 0.7 03/18/2014 1204   MONOABS 0.6 02/12/2014 0829   EOSABS 1.3* 03/18/2014 1204   EOSABS 1.2* 02/12/2014 0829   BASOSABS  0.1 03/18/2014 1204   BASOSABS 0.1 02/12/2014 0829      Recent Labs  02/12/14 0829  NA 143  K 4.6  CL 108  CO2 26  GLUCOSE 94  BUN 18  CREATININE 0.75  CALCIUM 8.9  GFRNONAA >89  GFRAA >89  PROT 6.1  ALBUMIN 3.3*  AST 15  ALT 11  ALKPHOS 67  BILITOT 0.6    RADIOGRAPHIC STUDIES: I have personally reviewed the radiological images as listed and agreed with the findings in the report. No results found.  ASSESSMENT & PLAN:  69 year old morbidly obese male, with past medical history of gastric bypass surgery, arthritis, presented with ringworm-like skin rash for 1 year, and was found to have a high eosinophil blood counts on routine lab test.   1. Eosinophilia -He has a mild  elevated eosinophil count, some chronic skin rash which is nonspecific, but otherwise asymptomatic. -The differential including reactive, versus hypereosinophilia syndrome or myeloproliferative neoplasm -Giving his frequent travel history, I would like to ruled out parasites and Strongyloides by stool and blood test -Although he does not have allergic symptoms, I'll check his IgE level - given his low level of eosinophilia, clinically asymptomatic, hypereosinophilic syndrome is possible but less likely. I'll check his peripheral blood for hypereosinophilia Fish panel -We'll also check troponin, chest x-ray and abdominal ultrasound -I discussed the role of bone marrow biopsy. I recommend clinical follow-up for now, and consider bone marrow biopsy if his eosinophilia count persistently increasing, or he develops more symptoms.   Plan -lab today, blood and stool -[place] of abdomen  -CXR -follow CBC in 6 and 12 weeks -RTC in 3 month and decide about bone marrow biopsy    All questions were answered. The patient knows to call the clinic with any problems, questions or concerns. I spent 40 minutes counseling the patient face to face. The total time spent in the appointment was 55 minutes and more than 50% was  on counseling.     Truitt Merle, MD 03/18/2014 10:57 AM

## 2014-03-18 NOTE — Telephone Encounter (Signed)
Gave avs & calendar for April & June. °

## 2014-03-19 ENCOUNTER — Telehealth: Payer: Self-pay | Admitting: *Deleted

## 2014-03-19 LAB — TRYPTASE: TRYPTASE: 9.5 ug/L (ref <11)

## 2014-03-19 LAB — IGE: IgE (Immunoglobulin E), Serum: 752 kU/L — ABNORMAL HIGH (ref <115)

## 2014-03-19 LAB — VITAMIN B12: Vitamin B-12: 337 pg/mL (ref 211–911)

## 2014-03-19 LAB — OVA AND PARASITE EXAMINATION

## 2014-03-19 NOTE — Telephone Encounter (Signed)
Call received from [facility] asking if providerwants test for code J161 performed or not. Call transferred to [facility] technologist.

## 2014-03-25 ENCOUNTER — Ambulatory Visit (HOSPITAL_COMMUNITY)
Admission: RE | Admit: 2014-03-25 | Discharge: 2014-03-25 | Disposition: A | Discharge status: Home / Self Care | Payer: BLUE CROSS/BLUE SHIELD | Source: Ambulatory Visit | Attending: Hematology | Admitting: Hematology

## 2014-03-25 DIAGNOSIS — D72119 Hypereosinophilic syndrome (hes), unspecified: Secondary | ICD-10-CM

## 2014-03-25 DIAGNOSIS — D721 Eosinophilia, unspecified: Secondary | ICD-10-CM

## 2014-03-26 ENCOUNTER — Telehealth: Payer: Self-pay | Admitting: Hematology

## 2014-03-26 LAB — FISH, PERIPHERAL BLOOD

## 2014-03-26 NOTE — Telephone Encounter (Signed)
Confirm appointment change from 06/15 to 06/15.

## 2014-03-29 LAB — TISSUE HYBRIDIZATION TO NCBH

## 2014-04-25 ENCOUNTER — Telehealth: Payer: Self-pay | Admitting: *Deleted

## 2014-04-25 NOTE — Telephone Encounter (Signed)
Received call from [facility] requesting diagnosis on pt.  Esinophilia Dx given.

## 2014-04-29 ENCOUNTER — Other Ambulatory Visit (HOSPITAL_COMMUNITY)
Admission: RE | Admit: 2014-04-29 | Discharge: 2014-04-29 | Disposition: A | Discharge status: Home / Self Care | Payer: BLUE CROSS/BLUE SHIELD | Source: Ambulatory Visit | Attending: Hematology | Admitting: Hematology

## 2014-04-29 ENCOUNTER — Other Ambulatory Visit (HOSPITAL_BASED_OUTPATIENT_CLINIC_OR_DEPARTMENT_OTHER): Payer: BLUE CROSS/BLUE SHIELD

## 2014-04-29 DIAGNOSIS — D721 Eosinophilia, unspecified: Secondary | ICD-10-CM

## 2014-04-29 DIAGNOSIS — D72119 Hypereosinophilic syndrome (hes), unspecified: Secondary | ICD-10-CM

## 2014-04-29 LAB — CBC & DIFF AND RETIC
BASO%: 1.1 % (ref 0.0–2.0)
Basophils Absolute: 0.1 10*3/uL (ref 0.0–0.1)
EOS ABS: 1.1 10*3/uL — AB (ref 0.0–0.5)
EOS%: 22.9 % — ABNORMAL HIGH (ref 0.0–7.0)
HCT: 43.1 % (ref 38.4–49.9)
HGB: 14.4 g/dL (ref 13.0–17.1)
Immature Retic Fract: 1.7 % — ABNORMAL LOW (ref 3.00–10.60)
LYMPH%: 23.7 % (ref 14.0–49.0)
MCH: 30.6 pg (ref 27.2–33.4)
MCHC: 33.4 g/dL (ref 32.0–36.0)
MCV: 91.7 fL (ref 79.3–98.0)
MONO#: 0.6 10*3/uL (ref 0.1–0.9)
MONO%: 12.2 % (ref 0.0–14.0)
NEUT%: 40.1 % (ref 39.0–75.0)
NEUTROS ABS: 1.9 10*3/uL (ref 1.5–6.5)
Platelets: 200 10*3/uL (ref 140–400)
RBC: 4.7 10*6/uL (ref 4.20–5.82)
RDW: 13.3 % (ref 11.0–14.6)
RETIC CT ABS: 47 10*3/uL (ref 34.80–93.90)
Retic %: 1 % (ref 0.80–1.80)
WBC: 4.7 10*3/uL (ref 4.0–10.3)
lymph#: 1.1 10*3/uL (ref 0.9–3.3)

## 2014-04-29 LAB — TROPONIN I

## 2014-06-19 ENCOUNTER — Other Ambulatory Visit: Payer: Self-pay

## 2014-06-19 ENCOUNTER — Ambulatory Visit: Payer: Self-pay | Admitting: Hematology

## 2014-06-20 ENCOUNTER — Telehealth: Payer: Self-pay | Admitting: Hematology

## 2014-06-20 ENCOUNTER — Other Ambulatory Visit (HOSPITAL_BASED_OUTPATIENT_CLINIC_OR_DEPARTMENT_OTHER): Payer: BLUE CROSS/BLUE SHIELD

## 2014-06-20 ENCOUNTER — Ambulatory Visit (HOSPITAL_BASED_OUTPATIENT_CLINIC_OR_DEPARTMENT_OTHER): Payer: BLUE CROSS/BLUE SHIELD | Admitting: Hematology

## 2014-06-20 ENCOUNTER — Encounter: Payer: Self-pay | Admitting: Hematology

## 2014-06-20 ENCOUNTER — Ambulatory Visit: Payer: BLUE CROSS/BLUE SHIELD

## 2014-06-20 VITALS — BP 151/75 | HR 78 | Temp 98.5°F | Resp 20 | Ht 72.0 in | Wt 328.0 lb

## 2014-06-20 DIAGNOSIS — D721 Eosinophilia, unspecified: Secondary | ICD-10-CM

## 2014-06-20 DIAGNOSIS — D72119 Hypereosinophilic syndrome (hes), unspecified: Secondary | ICD-10-CM

## 2014-06-20 LAB — CBC & DIFF AND RETIC
BASO%: 1.3 % (ref 0.0–2.0)
BASOS ABS: 0.1 10*3/uL (ref 0.0–0.1)
EOS%: 19 % — ABNORMAL HIGH (ref 0.0–7.0)
Eosinophils Absolute: 0.9 10*3/uL — ABNORMAL HIGH (ref 0.0–0.5)
HCT: 42.2 % (ref 38.4–49.9)
HEMOGLOBIN: 14.1 g/dL (ref 13.0–17.1)
Immature Retic Fract: 1.6 % — ABNORMAL LOW (ref 3.00–10.60)
LYMPH#: 1.2 10*3/uL (ref 0.9–3.3)
LYMPH%: 26.5 % (ref 14.0–49.0)
MCH: 30.5 pg (ref 27.2–33.4)
MCHC: 33.4 g/dL (ref 32.0–36.0)
MCV: 91.3 fL (ref 79.3–98.0)
MONO#: 0.6 10*3/uL (ref 0.1–0.9)
MONO%: 12.2 % (ref 0.0–14.0)
NEUT%: 41 % (ref 39.0–75.0)
NEUTROS ABS: 1.9 10*3/uL (ref 1.5–6.5)
Platelets: 209 10*3/uL (ref 140–400)
RBC: 4.62 10*6/uL (ref 4.20–5.82)
RDW: 13.2 % (ref 11.0–14.6)
RETIC CT ABS: 43.89 10*3/uL (ref 34.80–93.90)
Retic %: 0.95 % (ref 0.80–1.80)
WBC: 4.7 10*3/uL (ref 4.0–10.3)

## 2014-06-20 NOTE — Progress Notes (Signed)
[place]  Telephone:(336) [phone] Fax:(336) [place] Note   Patient Care Team: Susy Frizzle, MD as PCP - General (Family Medicine) 06/20/2014  CHIEF COMPLAINTS/PURPOSE OF CONSULTATION:  Eosinophilia   REFERRAL PHYSICIAN: Dr. Dennard Schaumann   HISTORY OF PRESENTING ILLNESS:  Keith Watkins 69 y.o. male is here because of elevated blood eosinophils counts.   He has had scatter skin rash for one year, it looks like ring worm, but it does not bother him, no itchiness or pain, it has not changed significantly lately.. No other complains. He frequently travels internationally, including [place] and [place]. He does have arthritis pain which is chronic and stable. No chest pain, dyspnea or there complains. He has fair appetite and energy level. He works 7 days a week. No weight loss.   He had routine physical exam with his primary care physician Dr. Cindi Carbon and had CBC done on 01/11/2013 w hich showed a WBC 5.2, absolute neutrophil 4.9, absolute lymphocytes 1.2, absolute eosinophil 1.2, hemoglobin 14.7, platelet count 241K. He underwent a punch skin biopsy by Dr. 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  INTERIM HISTORY Keith Watkins returns for follow up. He is doing well. He continue to travel internationally, and just came back from [place]. He feels well overall, he still has the ring-worm like scan rashes, which is chronic and does not bother him. No other complains, no dyspnea, chest pain, diarrhea or neuropathy.    MEDICAL HISTORY:  Past Medical History  Diagnosis Date  . Back pain   . Arthritis   . Enlarged prostate   . Lumbar spinal stenosis   . Cataract   . Hypertension     prior to gastric bypass- no meds taken    SURGICAL HISTORY: Past Surgical History  Procedure Laterality Date  . Stomach stapled    . Cholecystectomy    . Appendectomy    . Arthroscopic knee surgery    . I&d extremity  08/07/2011    Procedure:  IRRIGATION AND DEBRIDEMENT EXTREMITY;  Surgeon: Wylene Simmer, MD;  Location: [facility];  Service: Orthopedics;  Laterality: Left;  irrigation and debridement left foot abscess    SOCIAL HISTORY: History   Social History  . Marital Status: Married    Spouse Name: N/A  . Number of Children: 3  . Years of Education: N/A   Occupational History  . He works for a [employer], clinical    Social History Main Topics  . Smoking status: Never Smoker   . Smokeless tobacco: Never Used  . Alcohol Use: No  . Drug Use: No  . Sexual Activity: Not on file   Other Topics Concern  . Not on file   Social History Narrative    FAMILY HISTORY: Family History  Problem Relation Age of Onset  . Diabetes Other   . Stroke Other   . Hypertension Other   . Coronary artery disease Other   . Heart failure Other   . Heart disease Mother   . Cancer Sister     Breast  . Breast cancer Sister   . Hypertension Brother   . Hypertension Maternal Grandmother   . Stroke Maternal Grandmother   . Heart disease Maternal Grandfather   . Hypertension Maternal Grandfather   . Diabetes Sister   . Heart disease Sister   . Hyperlipidemia Sister   . Hypertension Sister   . Stroke Sister   . Colon cancer Neg Hx   . Esophageal cancer Neg Hx   .  Stomach cancer Neg Hx   . Rectal cancer Neg Hx   No other FH of malignancy or blood disorders  ALLERGIES:  has No Known Allergies.  MEDICATIONS:  Current Outpatient Prescriptions  Medication Sig Dispense Refill  . tamsulosin (FLOMAX) 0.4 MG CAPS capsule Take 1 capsule (0.4 mg total) by mouth at bedtime. 90 capsule 3   No current facility-administered medications for this visit.    REVIEW OF SYSTEMS:   Constitutional: Denies fevers, chills or abnormal night sweats Eyes: Denies blurriness of vision, double vision or watery eyes Ears, nose, mouth, throat, and face: Denies mucositis or sore throat Respiratory: Denies cough, dyspnea or wheezes Cardiovascular: Denies  palpitation, chest discomfort or lower extremity swelling Gastrointestinal:  Denies nausea, heartburn or change in bowel habits Skin: (+) skin rashes  Lymphatics: Denies new lymphadenopathy or easy bruising Neurological:Denies numbness, tingling or new weaknesses Behavioral/Psych: Mood is stable, no new changes  All other systems were reviewed with the patient and are negative.  PHYSICAL EXAMINATION: ECOG PERFORMANCE STATUS: 1 - Symptomatic but completely ambulatory  Filed Vitals:   06/20/14 0904  BP: 151/75  Pulse: 78  Temp: 98.5 F (36.9 C)  Resp: 20   Filed Weights   06/20/14 0904  Weight: 328 lb (148.78 kg)    GENERAL:alert, no distress and comfortable SKIN: skin color, texture, turgor are normal,  there are several papular skin rash with central pale, measuring a few mm to 1.5cm on his back and arms. EYES: normal, conjunctiva are pink and non-injected, sclera clear OROPHARYNX:no exudate, no erythema and lips, buccal mucosa, and tongue normal  NECK: supple, thyroid normal size, non-tender, without nodularity LYMPH:  no palpable lymphadenopathy in the cervical, axillary or inguinal LUNGS: clear to auscultation and percussion with normal breathing effort HEART: regular rate & rhythm and no murmurs and no lower extremity edema ABDOMEN:abdomen soft, non-tender and normal bowel sounds Musculoskeletal:no cyanosis of digits and no clubbing  PSYCH: alert & oriented x 3 with fluent speech NEURO: no focal motor/sensory deficits  LABORATORY DATA:  I have reviewed the data as listed CBC    Component Value Date/Time   WBC 4.7 06/20/2014 0847   WBC 5.2 02/12/2014 0829   RBC 4.62 06/20/2014 0847   RBC 4.90 02/12/2014 0829   HGB 14.1 06/20/2014 0847   HGB 14.7 02/12/2014 0829   HCT 42.2 06/20/2014 0847   HCT 43.7 02/12/2014 0829   PLT 209 06/20/2014 0847   PLT 241 02/12/2014 0829   MCV 91.3 06/20/2014 0847   MCV 89.2 02/12/2014 0829   MCH 30.5 06/20/2014 0847   MCH 30.0  02/12/2014 0829   MCHC 33.4 06/20/2014 0847   MCHC 33.6 02/12/2014 0829   RDW 13.2 06/20/2014 0847   RDW 13.6 02/12/2014 0829   LYMPHSABS 1.2 06/20/2014 0847   LYMPHSABS 1.2 02/12/2014 0829   MONOABS 0.6 06/20/2014 0847   MONOABS 0.6 02/12/2014 0829   EOSABS 0.9* 06/20/2014 0847   EOSABS 1.2* 02/12/2014 0829   BASOSABS 0.1 06/20/2014 0847   BASOSABS 0.1 02/12/2014 0829      Recent Labs  02/12/14 0829 03/18/14 1204  NA 143 140  K 4.6 4.0  CL 108  --   CO2 26 26  GLUCOSE 94 100  BUN 18 14.3  CREATININE 0.75 0.8  CALCIUM 8.9 9.4  GFRNONAA >89  --   GFRAA >89  --   PROT 6.1 6.8  ALBUMIN 3.3* 3.4*  AST 15 18  ALT 11 15  ALKPHOS 67 80  BILITOT  0.6 1.12    RADIOGRAPHIC STUDIES: I have personally reviewed the radiological images as listed and agreed with the findings in the report. No results found.  ASSESSMENT & PLAN:  69 year old morbidly obese male, with past medical history of gastric bypass surgery, arthritis, presented with ringworm-like skin rash for 1 year, and was found to have a high eosinophil blood counts on routine lab test.   1. Eosinophilia -He has a mild elevated eosinophil count, some chronic skin rash which is nonspecific, but otherwise asymptomatic. -The differential including reactive, versus hypereosinophilia syndrome or myeloproliferative neoplasm -Giving his frequent travel history, I would like to ruled out parasites and Strongyloides by stool and blood test. His stool was negative for ova and parasites. Strongloides antibody test is still pending. -He is currently doing well, no clinical sign of hypereosinophilia syndrome, such as skin rash, she has symptoms or heart failure.Given his low level of eosinophilia, clinically asymptomatic, hypereosinophilic syndrome is unlikely. -His troponin and abdominal ultrasound were unremarkable except fatty liver -I discussed the role of bone marrow biopsy. I recommend clinical follow-up for now, and consider  bone marrow biopsy if his eosinophilia count persistently increasing, or he develops more symptoms. -We'll continue monitoring her clinically.   Plan -Return to clinic in 1 year with CBC and differential   All questions were answered. The patient knows to call the clinic with any problems, questions or concerns. I spent 20 minutes counseling the patient face to face. The total time spent in the appointment was 25 minutes and more than 50% was on counseling.     Truitt Merle, MD 06/20/2014 9:12 AM

## 2014-06-20 NOTE — Telephone Encounter (Signed)
per pof to sch pt appt-gave pt avs °

## 2014-06-27 LAB — STRONGYLOIDES ANTIBODY: STRONGYLOIDES IGG ANTIBODY, ELISA: NEGATIVE

## 2015-02-18 ENCOUNTER — Other Ambulatory Visit: Payer: BLUE CROSS/BLUE SHIELD

## 2015-02-18 ENCOUNTER — Other Ambulatory Visit: Payer: Self-pay | Admitting: Family Medicine

## 2015-02-18 DIAGNOSIS — I10 Essential (primary) hypertension: Secondary | ICD-10-CM

## 2015-02-18 DIAGNOSIS — E669 Obesity, unspecified: Secondary | ICD-10-CM

## 2015-02-18 DIAGNOSIS — Z0000 Encounter for general adult medical examination without abnormal findings: Secondary | ICD-10-CM

## 2015-02-18 DIAGNOSIS — N400 Enlarged prostate without lower urinary tract symptoms: Secondary | ICD-10-CM

## 2015-02-18 LAB — COMPLETE METABOLIC PANEL WITH GFR
ALBUMIN: 3.1 g/dL — AB (ref 3.6–5.1)
ALK PHOS: 70 U/L (ref 40–115)
ALT: 8 U/L — ABNORMAL LOW (ref 9–46)
AST: 14 U/L (ref 10–35)
BILIRUBIN TOTAL: 0.8 mg/dL (ref 0.2–1.2)
BUN: 17 mg/dL (ref 7–25)
CO2: 28 mmol/L (ref 20–31)
Calcium: 8.6 mg/dL (ref 8.6–10.3)
Chloride: 105 mmol/L (ref 98–110)
Creat: 0.85 mg/dL (ref 0.70–1.25)
GFR, EST NON AFRICAN AMERICAN: 89 mL/min (ref >=60)
Glucose, Bld: 89 mg/dL (ref 70–99)
POTASSIUM: 4.7 mmol/L (ref 3.5–5.3)
SODIUM: 140 mmol/L (ref 135–146)
TOTAL PROTEIN: 6.1 g/dL (ref 6.1–8.1)

## 2015-02-18 LAB — CBC WITH DIFFERENTIAL/PLATELET
Basophils Absolute: 0.1 10*3/uL (ref 0.0–0.1)
Basophils Relative: 1 % (ref 0–1)
Eosinophils Absolute: 1.2 10*3/uL — ABNORMAL HIGH (ref 0.0–0.7)
Eosinophils Relative: 22 % — ABNORMAL HIGH (ref 0–5)
HCT: 42.6 % (ref 39.0–52.0)
HEMOGLOBIN: 14.3 g/dL (ref 13.0–17.0)
LYMPHS ABS: 1.3 10*3/uL (ref 0.7–4.0)
LYMPHS PCT: 24 % (ref 12–46)
MCH: 30.4 pg (ref 26.0–34.0)
MCHC: 33.6 g/dL (ref 30.0–36.0)
MCV: 90.4 fL (ref 78.0–100.0)
MONO ABS: 0.5 10*3/uL (ref 0.1–1.0)
MONOS PCT: 9 % (ref 3–12)
MPV: 9.5 fL (ref 8.6–12.4)
NEUTROS ABS: 2.3 10*3/uL (ref 1.7–7.7)
NEUTROS PCT: 44 % (ref 43–77)
PLATELETS: 227 10*3/uL (ref 150–400)
RBC: 4.71 MIL/uL (ref 4.22–5.81)
RDW: 13.3 % (ref 11.5–15.5)
WBC: 5.3 10*3/uL (ref 4.0–10.5)

## 2015-02-18 LAB — LIPID PANEL
CHOLESTEROL: 134 mg/dL (ref 125–200)
HDL: 37 mg/dL — AB (ref >=40)
LDL Cholesterol: 81 mg/dL (ref <130)
TRIGLYCERIDES: 81 mg/dL (ref <150)
Total CHOL/HDL Ratio: 3.6 Ratio (ref <=5.0)
VLDL: 16 mg/dL (ref <30)

## 2015-02-18 LAB — TSH: TSH: 2.03 mIU/L (ref 0.40–4.50)

## 2015-02-19 LAB — PSA: PSA: 0.34 ng/mL (ref <=4.00)

## 2015-02-21 ENCOUNTER — Encounter: Payer: Self-pay | Admitting: Family Medicine

## 2015-02-21 ENCOUNTER — Ambulatory Visit (INDEPENDENT_AMBULATORY_CARE_PROVIDER_SITE_OTHER): Payer: BLUE CROSS/BLUE SHIELD | Admitting: Family Medicine

## 2015-02-21 VITALS — BP 136/80 | HR 80 | Temp 98.5°F | Resp 18 | Ht 70.5 in | Wt 336.0 lb

## 2015-02-21 DIAGNOSIS — Z0000 Encounter for general adult medical examination without abnormal findings: Secondary | ICD-10-CM

## 2015-02-21 NOTE — Progress Notes (Signed)
Subjective:    Patient ID: Keith Watkins, male    DOB: 05/27/1945, 70 y.o.   MRN: [id number]  HPI 02/14/14 Patient is here today for complete physical exam. He is due for colonoscopy. Patient's immunizations are up-to-date. He has had his pneumonia vaccine including Pneumovax and Prevnar. He got his flu shot today. His tetanus shot is up-to-date. We discussed the shingles vaccine. We sent the shingles vaccine to [facility] for the patient. Flomax is not helping his nocturia. He felt that the patient may have an element of overactive bladder. Concerning week, the patient has a ringworm-like rash on his back and on his chest. The area on his back is approximately 6 cm in diameter. There are 2 areas on his chest which are each about 1.5 cm in diameter. Patient is unsure how long these areas have been there. His lab work is also significant for severe eosinophilia with the eosinophil count of 24. Patient denies any recent international travel. He denies any diarrhea or symptoms of parasitic infection. He denies any allergies.  At that time, my plan was: Patient's physical exam is significant for morbid obesity. He also has a rash that is consistent either with tinea, granuloma annulare are, lichen planus. I am concerned this rash may be related to his eosinophilia. Therefore I've asked the patient to return tomorrow for an excisional biopsy to rule out mastocytosis. If the biopsy reveals no specific cause for the rash, the next step would be to perform stool cultures for O&P to evaluate for parasitic infections. If this is negative I would refer the patient to hematology to discuss other possible workup.  I recommended diet exercise and weight loss. I will schedule the patient for colonoscopy. I've also recommended a referral to a dentist as the patient has very poor dentition which may increase his risk of cardiovascular disease. Patient was given a flu shot today. We also sent a prescription for the shingles shot to a  local pharmacy. I believe the patient may have overactive bladder rather than BPH. I would consider switching the patient to Vesicare once we have determined the cause of his eosinophilia.  02/21/15 Colonoscopy revealed diverticulosis but no cancer. Immunizations are up-to-date. The patient declines a flu shot.  Lab work is excellent. Patient continues to complain of urge incontinence. He is scheduled to follow-up with oncology later this year. At the present time the plan is to clinically monitor the patient has no abnormalities of been identified. His eosinophil count remained stable Appointment on 02/18/2015  Component Date Value Ref Range Status  . Sodium 02/18/2015 140  135 - 146 mmol/L Final  . Potassium 02/18/2015 4.7  3.5 - 5.3 mmol/L Final  . Chloride 02/18/2015 105  98 - 110 mmol/L Final  . CO2 02/18/2015 28  20 - 31 mmol/L Final  . Glucose, Bld 02/18/2015 89  70 - 99 mg/dL Final  . BUN 02/18/2015 17  7 - 25 mg/dL Final  . Creat 02/18/2015 0.85  0.70 - 1.25 mg/dL Final  . Total Bilirubin 02/18/2015 0.8  0.2 - 1.2 mg/dL Final  . Alkaline Phosphatase 02/18/2015 70  40 - 115 U/L Final  . AST 02/18/2015 14  10 - 35 U/L Final  . ALT 02/18/2015 8* 9 - 46 U/L Final  . Total Protein 02/18/2015 6.1  6.1 - 8.1 g/dL Final  . Albumin 02/18/2015 3.1* 3.6 - 5.1 g/dL Final  . Calcium 02/18/2015 8.6  8.6 - 10.3 mg/dL Final  . GFR, Est  African American 02/18/2015 >89  >=60 mL/min Final  . GFR, Est Non African American 02/18/2015 89  >=60 mL/min Final   Comment:   The estimated GFR is a calculation valid for adults (>=26 years old) that uses the CKD-EPI algorithm to adjust for age and sex. It is   not to be used for children, pregnant women, hospitalized patients,    patients on dialysis, or with rapidly changing kidney function. According to the NKDEP, eGFR >89 is normal, 60-89 shows mild impairment, 30-59 shows moderate impairment, 15-29 shows severe impairment and <15 is ESRD.     . TSH  02/18/2015 2.03  0.40 - 4.50 mIU/L Final   Comment: ** Please note change in unit of measure and reference range(s). **     . Cholesterol 02/18/2015 134  125 - 200 mg/dL Final  . Triglycerides 02/18/2015 81  <150 mg/dL Final  . HDL 02/18/2015 37* >=40 mg/dL Final  . Total CHOL/HDL Ratio 02/18/2015 3.6  <=5.0 Ratio Final  . VLDL 02/18/2015 16  <30 mg/dL Final  . LDL Cholesterol 02/18/2015 81  <130 mg/dL Final   Comment:   Total Cholesterol/HDL Ratio:CHD Risk                        Coronary Heart Disease Risk Table                                        Men       Women          1/2 Average Risk              3.4        3.3              Average Risk              5.0        4.4           2X Average Risk              9.6        7.1           3X Average Risk             23.4       11.0 Use the calculated Patient Ratio above and the CHD Risk table  to determine the patient's CHD Risk.   . WBC 02/18/2015 5.3  4.0 - 10.5 K/uL Final  . RBC 02/18/2015 4.71  4.22 - 5.81 MIL/uL Final  . Hemoglobin 02/18/2015 14.3  13.0 - 17.0 g/dL Final  . HCT 02/18/2015 42.6  39.0 - 52.0 % Final  . MCV 02/18/2015 90.4  78.0 - 100.0 fL Final  . MCH 02/18/2015 30.4  26.0 - 34.0 pg Final  . MCHC 02/18/2015 33.6  30.0 - 36.0 g/dL Final  . RDW 02/18/2015 13.3  11.5 - 15.5 % Final  . Platelets 02/18/2015 227  150 - 400 K/uL Final  . MPV 02/18/2015 9.5  8.6 - 12.4 fL Final  . Neutrophils Relative % 02/18/2015 44  43 - 77 % Final  . Neutro Abs 02/18/2015 2.3  1.7 - 7.7 K/uL Final  . Lymphocytes Relative 02/18/2015 24  12 - 46 % Final  . Lymphs Abs 02/18/2015 1.3  0.7 - 4.0 K/uL Final  . Monocytes Relative 02/18/2015 9  3 -  12 % Final  . Monocytes Absolute 02/18/2015 0.5  0.1 - 1.0 K/uL Final  . Eosinophils Relative 02/18/2015 22* 0 - 5 % Final  . Eosinophils Absolute 02/18/2015 1.2* 0.0 - 0.7 K/uL Final  . Basophils Relative 02/18/2015 1  0 - 1 % Final  . Basophils Absolute 02/18/2015 0.1  0.0 - 0.1 K/uL Final  .  Smear Review 02/18/2015 Criteria for review not met   Final  . PSA 02/18/2015 0.34  <=4.00 ng/mL Final   Comment: Test Methodology: ECLIA PSA (Electrochemiluminescence Immunoassay)   For PSA values from 2.5-4.0, particularly in younger men <66 years old, the AUA and NCCN suggest testing for % Free PSA (3515) and evaluation of the rate of increase in PSA (PSA velocity).    Past Medical History  Diagnosis Date  . Back pain   . Arthritis   . Enlarged prostate   . Lumbar spinal stenosis   . Cataract   . Hypertension     prior to gastric bypass- no meds taken   Past Surgical History  Procedure Laterality Date  . Stomach stapled    . Cholecystectomy    . Appendectomy    . Arthroscopic knee surgery    . I&d extremity  08/07/2011    Procedure: IRRIGATION AND DEBRIDEMENT EXTREMITY;  Surgeon: Wylene Simmer, MD;  Location: [facility];  Service: Orthopedics;  Laterality: Left;  irrigation and debridement left foot abscess   Current Outpatient Prescriptions on File Prior to Visit  Medication Sig Dispense Refill  . tamsulosin (FLOMAX) 0.4 MG CAPS capsule Take 1 capsule (0.4 mg total) by mouth at bedtime. 90 capsule 3   No current facility-administered medications on file prior to visit.   No Known Allergies Social History   Social History  . Marital Status: Married    Spouse Name: N/A  . Number of Children: N/A  . Years of Education: N/A   Occupational History  . Not on file.   Social History Main Topics  . Smoking status: Never Smoker   . Smokeless tobacco: Never Used  . Alcohol Use: No  . Drug Use: No  . Sexual Activity: Not on file   Other Topics Concern  . Not on file   Social History Narrative   Family History  Problem Relation Age of Onset  . Diabetes Other   . Stroke Other   . Hypertension Other   . Coronary artery disease Other   . Heart failure Other   . Heart disease Mother   . Cancer Sister     Breast  . Breast cancer Sister   . Hypertension Brother   .  Hypertension Maternal Grandmother   . Stroke Maternal Grandmother   . Heart disease Maternal Grandfather   . Hypertension Maternal Grandfather   . Diabetes Sister   . Heart disease Sister   . Hyperlipidemia Sister   . Hypertension Sister   . Stroke Sister   . Colon cancer Neg Hx   . Esophageal cancer Neg Hx   . Stomach cancer Neg Hx   . Rectal cancer Neg Hx       Review of Systems  All other systems reviewed and are negative.      Objective:   Physical Exam  Constitutional: He is oriented to person, place, and time. He appears well-developed and well-nourished. No distress.  HENT:  Head: Normocephalic and atraumatic.  Right Ear: External ear normal.  Left Ear: External ear normal.  Nose: Nose normal.  Mouth/Throat: Oropharynx is clear  and moist. No oropharyngeal exudate.  Eyes: Conjunctivae and EOM are normal. Pupils are equal, round, and reactive to light. Right eye exhibits no discharge. Left eye exhibits no discharge. No scleral icterus.  Neck: Normal range of motion. Neck supple. No JVD present. No tracheal deviation present. No thyromegaly present.  Cardiovascular: Normal rate, regular rhythm, normal heart sounds and intact distal pulses.  Exam reveals no gallop and no friction rub.   No murmur heard. Pulmonary/Chest: Effort normal and breath sounds normal. No stridor. No respiratory distress. He has no wheezes. He has no rales. He exhibits no tenderness.  Abdominal: Soft. Bowel sounds are normal. He exhibits no distension and no mass. There is no tenderness. There is no rebound and no guarding.  Musculoskeletal: Normal range of motion. He exhibits no edema or tenderness.  Lymphadenopathy:    He has no cervical adenopathy.  Neurological: He is alert and oriented to person, place, and time. He has normal reflexes. No cranial nerve deficit. He exhibits normal muscle tone. Coordination normal.  Skin: Rash noted. He is not diaphoretic. There is erythema.  Psychiatric: He  has a normal mood and affect. His behavior is normal. Judgment and thought content normal.  Vitals reviewed.         Assessment & Plan:  Physical exam is significant for poor dentition and morbid obesity. 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 I recommended a flu shot but he declined. We will try the patient on Myrbetriq 25 mg a day and gradually increase to 50 mg a day over a period of 3 weeks. If symptoms improve we will treat the patient as overactive bladder and discontinue Flomax. Offered the patient a flu shot but he declined. Colonoscopy is up-to-date and the remainder of his lab work is excellent.

## 2015-04-09 ENCOUNTER — Telehealth: Payer: Self-pay | Admitting: Family Medicine

## 2015-04-09 NOTE — Telephone Encounter (Signed)
Pt is calling to let Dr. Dennard Schaumann know that the Myrbetriq is not working. He states that it worked initially, and then it worked "too well". He would like to speak with Dr. Dennard Schaumann about the reaction that the medication had on him. Please call  [phone]

## 2015-04-11 ENCOUNTER — Other Ambulatory Visit: Payer: Self-pay | Admitting: Family Medicine

## 2015-04-11 MED ORDER — TAMSULOSIN HCL 0.4 MG PO CAPS: 0.4000 mg | ORAL_CAPSULE | Freq: Every day | ORAL | Status: DC

## 2015-04-11 MED ORDER — SOLIFENACIN SUCCINATE 5 MG PO TABS: 5.0000 mg | ORAL_TABLET | Freq: Every day | ORAL | Status: DC

## 2015-04-11 NOTE — Telephone Encounter (Signed)
myrbetriq caused urinary retention.  DC myrbetriq.  Resume flomax and add vesciare 5 mg poqday

## 2015-04-14 ENCOUNTER — Telehealth: Payer: Self-pay | Admitting: *Deleted

## 2015-04-14 NOTE — Telephone Encounter (Signed)
Received request from pharmacy for PA on [place].   PA submitted.   Dx: N40.1 BPH with retention

## 2015-04-17 NOTE — Telephone Encounter (Signed)
Received PA determination.   PA denied.   Patient must try and fail one formulary alternative: Darifenacin Oxybutynin Tolterodine Trospium  MD please advise.

## 2015-04-18 NOTE — Telephone Encounter (Signed)
Notify patient, insurance will only cover oxybutynin 5 mg pobid.

## 2015-04-18 NOTE — Telephone Encounter (Signed)
LMTRC

## 2015-04-24 NOTE — Telephone Encounter (Signed)
Call placed to patient. LMTRC.  

## 2015-04-25 MED ORDER — OXYBUTYNIN CHLORIDE 5 MG PO TABS: 5.0000 mg | ORAL_TABLET | Freq: Two times a day (BID) | ORAL | Status: DC

## 2015-04-25 NOTE — Addendum Note (Signed)
Addended by: Sheral Flow on: 04/25/2015 09:52 AM   Modules accepted: Orders, Medications

## 2015-04-25 NOTE — Telephone Encounter (Signed)
Patient returned call and made aware.   Prescription sent to pharmacy.  

## 2015-05-23 ENCOUNTER — Telehealth: Payer: Self-pay | Admitting: Hematology

## 2015-05-23 NOTE — Telephone Encounter (Signed)
S/w pt, advised appt chg due to md pal from 6/16. Pt req appt on 6/7. Gave appt 6/7 @ 9.15am.

## 2015-06-11 ENCOUNTER — Ambulatory Visit (HOSPITAL_BASED_OUTPATIENT_CLINIC_OR_DEPARTMENT_OTHER): Payer: BLUE CROSS/BLUE SHIELD | Admitting: Hematology

## 2015-06-11 ENCOUNTER — Other Ambulatory Visit (HOSPITAL_BASED_OUTPATIENT_CLINIC_OR_DEPARTMENT_OTHER): Payer: BLUE CROSS/BLUE SHIELD

## 2015-06-11 ENCOUNTER — Encounter: Payer: Self-pay | Admitting: Hematology

## 2015-06-11 ENCOUNTER — Telehealth: Payer: Self-pay | Admitting: Hematology

## 2015-06-11 VITALS — BP 132/75 | HR 78 | Temp 98.3°F | Resp 18 | Ht 70.5 in | Wt 336.9 lb

## 2015-06-11 DIAGNOSIS — D721 Eosinophilia, unspecified: Secondary | ICD-10-CM

## 2015-06-11 LAB — CBC WITH DIFFERENTIAL/PLATELET
BASO%: 1.4 % (ref 0.0–2.0)
BASOS ABS: 0.1 10*3/uL (ref 0.0–0.1)
EOS ABS: 0.8 10*3/uL — AB (ref 0.0–0.5)
EOS%: 15.4 % — ABNORMAL HIGH (ref 0.0–7.0)
HCT: 45.1 % (ref 38.4–49.9)
HEMOGLOBIN: 14.9 g/dL (ref 13.0–17.1)
LYMPH%: 25 % (ref 14.0–49.0)
MCH: 29.9 pg (ref 27.2–33.4)
MCHC: 33.1 g/dL (ref 32.0–36.0)
MCV: 90.3 fL (ref 79.3–98.0)
MONO#: 0.6 10*3/uL (ref 0.1–0.9)
MONO%: 11.2 % (ref 0.0–14.0)
NEUT%: 47 % (ref 39.0–75.0)
NEUTROS ABS: 2.5 10*3/uL (ref 1.5–6.5)
PLATELETS: 208 10*3/uL (ref 140–400)
RBC: 5 10*6/uL (ref 4.20–5.82)
RDW: 14 % (ref 11.0–14.6)
WBC: 5.3 10*3/uL (ref 4.0–10.3)
lymph#: 1.3 10*3/uL (ref 0.9–3.3)

## 2015-06-11 NOTE — Progress Notes (Signed)
[name]  [phone]) [phone] [phone]  Clinic Follow Up Note   Patient Care Team: Susy Frizzle, MD as PCP - General (Family Medicine) 06/11/2015  CHIEF COMPLAINTS/PURPOSE OF CONSULTATION:  Eosinophilia   HISTORY OF PRESENTING ILLNESS:  Keith Watkins 70 y.o. male is here because of elevated blood eosinophils counts.   He has had scatter skin rash for one year, it looks like ring worm, but it does not bother him, no itchiness or pain, it has not changed significantly lately.. No other complains. He frequently travels internationally, including [place] and [place]. He does have arthritis pain which is chronic and stable. No chest pain, dyspnea or there complains. He has fair appetite and energy level. He works 7 days a week. No weight loss.   He had routine physical exam with his primary care physician Dr. Cindi Carbon and had CBC done on 01/11/2013 w hich showed a WBC 5.2, absolute neutrophil 4.9, absolute lymphocytes 1.2, absolute eosinophil 1.2, hemoglobin 14.7, platelet count 241K. He underwent a punch skin biopsy by Dr. 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  INTERIM HISTORY [name] returns for follow up. He was last seen by me 1 year ago. He has been doing very well since his last visit, denies any episodes of infection, ED visit, hospitalization or other major medical events. He still travels a lot due to her job, and feels well overall.    MEDICAL HISTORY:  Past Medical History  Diagnosis Date  . Back pain   . Arthritis   . Enlarged prostate   . Lumbar spinal stenosis   . Cataract   . Hypertension     prior to gastric bypass- no meds taken    SURGICAL HISTORY: Past Surgical History  Procedure Laterality Date  . Stomach stapled    . Cholecystectomy    . Appendectomy    . Arthroscopic knee surgery    . I&d extremity  08/07/2011    Procedure: IRRIGATION AND DEBRIDEMENT EXTREMITY;  Surgeon: Wylene Simmer, MD;  Location:  [facility];  Service: Orthopedics;  Laterality: Left;  irrigation and debridement left foot abscess    SOCIAL HISTORY: History   Social History  . Marital Status: Married    Spouse Name: N/A  . Number of Children: 3  . Years of Education: N/A   Occupational History  . He works for a [employer], clinical    Social History Main Topics  . Smoking status: Never Smoker   . Smokeless tobacco: Never Used  . Alcohol Use: No  . Drug Use: No  . Sexual Activity: Not on file   Other Topics Concern  . Not on file   Social History Narrative    FAMILY HISTORY: Family History  Problem Relation Age of Onset  . Diabetes Other   . Stroke Other   . Hypertension Other   . Coronary artery disease Other   . Heart failure Other   . Heart disease Mother   . Cancer Sister     Breast  . Breast cancer Sister   . Hypertension Brother   . Hypertension Maternal Grandmother   . Stroke Maternal Grandmother   . Heart disease Maternal Grandfather   . Hypertension Maternal Grandfather   . Diabetes Sister   . Heart disease Sister   . Hyperlipidemia Sister   . Hypertension Sister   . Stroke Sister   . Colon cancer Neg Hx   . Esophageal cancer Neg Hx   . Stomach cancer Neg  Hx   . Rectal cancer Neg Hx   No other FH of malignancy or blood disorders  ALLERGIES:  has No Known Allergies.  MEDICATIONS:  Current Outpatient Prescriptions  Medication Sig Dispense Refill  . oxybutynin (DITROPAN) 5 MG tablet Take 1 tablet (5 mg total) by mouth 2 (two) times daily. 180 tablet 1  . tamsulosin (FLOMAX) 0.4 MG CAPS capsule Take 1 capsule (0.4 mg total) by mouth at bedtime. 90 capsule 3   No current facility-administered medications for this visit.    REVIEW OF SYSTEMS:   Constitutional: Denies fevers, chills or abnormal night sweats Eyes: Denies blurriness of vision, double vision or watery eyes Ears, nose, mouth, throat, and face: Denies mucositis or sore throat Respiratory: Denies cough, dyspnea or  wheezes Cardiovascular: Denies palpitation, chest discomfort or lower extremity swelling Gastrointestinal:  Denies nausea, heartburn or change in bowel habits Skin: no rashes or lesions  Lymphatics: Denies new lymphadenopathy or easy bruising Neurological:Denies numbness, tingling or new weaknesses Behavioral/Psych: Mood is stable, no new changes  All other systems were reviewed with the patient and are negative.  PHYSICAL EXAMINATION: ECOG PERFORMANCE STATUS: 0  Filed Vitals:   06/11/15 0937  BP: 132/75  Pulse: 78  Temp: 98.3 F (36.8 C)  Resp: 18   Filed Weights   06/11/15 0937  Weight: 336 lb 14.4 oz (152.817 kg)    GENERAL:alert, no distress and comfortable SKIN: skin color, texture, turgor are normal, no rash or lesion EYES: normal, conjunctiva are pink and non-injected, sclera clear OROPHARYNX:no exudate, no erythema and lips, buccal mucosa, and tongue normal  NECK: supple, thyroid normal size, non-tender, without nodularity LYMPH:  no palpable lymphadenopathy in the cervical, axillary or inguinal LUNGS: clear to auscultation and percussion with normal breathing effort HEART: regular rate & rhythm and no murmurs and no lower extremity edema ABDOMEN:abdomen soft, non-tender and normal bowel sounds Musculoskeletal:no cyanosis of digits and no clubbing  PSYCH: alert & oriented x 3 with fluent speech NEURO: no focal motor/sensory deficits  LABORATORY DATA:  I have reviewed the data as listed CBC Latest Ref Rng 06/11/2015 02/18/2015 06/20/2014  WBC 4.0 - 10.3 10e3/uL 5.3 5.3 4.7  Hemoglobin 13.0 - 17.1 g/dL 14.9 14.3 14.1  Hematocrit 38.4 - 49.9 % 45.1 42.6 42.2  Platelets 140 - 400 10e3/uL 208 227 209    CMP Latest Ref Rng 02/18/2015 03/18/2014 02/12/2014  Glucose 70 - 99 mg/dL 89 100 94  BUN 7 - 25 mg/dL 17 14.3 18  Creatinine 0.70 - 1.25 mg/dL 0.85 0.8 0.75  Sodium 135 - 146 mmol/L 140 140 143  Potassium 3.5 - 5.3 mmol/L 4.7 4.0 4.6  Chloride 98 - 110 mmol/L 105 - 108   CO2 20 - 31 mmol/L 28 26 26   Calcium 8.6 - 10.3 mg/dL 8.6 9.4 8.9  Total Protein 6.1 - 8.1 g/dL 6.1 6.8 6.1  Total Bilirubin 0.2 - 1.2 mg/dL 0.8 1.12 0.6  Alkaline Phos 40 - 115 U/L 70 80 67  AST 10 - 35 U/L 14 18 15   ALT 9 - 46 U/L 8(L) 15 11      RADIOGRAPHIC STUDIES: I have personally reviewed the radiological images as listed and agreed with the findings in the report. No results found.  ASSESSMENT & PLAN:  70 year old morbidly obese male, with past medical history of gastric bypass surgery, arthritis, presented with ringworm-like skin rash for 1 year, and was found to have a high eosinophil blood counts on routine lab test.   1.  Eosinophilia -He has a mild elevated eosinophil count, some chronic skin rash which is nonspecific, but otherwise asymptomatic. -The differential including reactive, versus hypereosinophilia syndrome or myeloproliferative neoplasm -Giving his frequent travel history, I tested Strongyloides antibody which was negative. His stool was negative for ova and parasites. Strongloides antibody test is still pending. -He is currently doing well, no clinical sign of hypereosinophilia syndrome, hypereosinophilic syndrome is unlikely. -his absolute eosinophil count is 0.8 today, slightly above upper normal limits. The rest of the CBC were unremarkable. -I do not think he needs further workup for his some mild eosinophilia. -I recommend him to follow-up with his primary care physician, and check CBC with differential every 6-12 months. I'll see him on an as needed base in the future.   All questions were answered. The patient knows to call the clinic with any problems, questions or concerns.  I spent 15 minutes counseling the patient face to face. The total time spent in the appointment was 20 minutes and more than 50% was on counseling.     Truitt Merle, MD 06/11/2015 9:53 AM

## 2015-06-11 NOTE — Telephone Encounter (Signed)
appts on as needed for the future appts-none sch per Drfeng

## 2015-06-20 ENCOUNTER — Ambulatory Visit: Payer: Self-pay | Admitting: Hematology

## 2015-06-20 ENCOUNTER — Ambulatory Visit: Payer: Self-pay

## 2015-06-20 ENCOUNTER — Other Ambulatory Visit: Payer: Self-pay

## 2015-12-04 ENCOUNTER — Other Ambulatory Visit: Payer: Self-pay | Admitting: Family Medicine

## 2016-04-15 ENCOUNTER — Ambulatory Visit (INDEPENDENT_AMBULATORY_CARE_PROVIDER_SITE_OTHER): Payer: BLUE CROSS/BLUE SHIELD | Admitting: Family Medicine

## 2016-04-15 ENCOUNTER — Encounter: Payer: Self-pay | Admitting: Family Medicine

## 2016-04-15 VITALS — BP 136/80 | HR 76 | Temp 98.0°F | Resp 18 | Ht 71.0 in | Wt 340.0 lb

## 2016-04-15 DIAGNOSIS — Z1159 Encounter for screening for other viral diseases: Secondary | ICD-10-CM

## 2016-04-15 DIAGNOSIS — R030 Elevated blood-pressure reading, without diagnosis of hypertension: Secondary | ICD-10-CM | POA: Diagnosis not present

## 2016-04-15 DIAGNOSIS — N401 Enlarged prostate with lower urinary tract symptoms: Secondary | ICD-10-CM | POA: Diagnosis not present

## 2016-04-15 DIAGNOSIS — R350 Frequency of micturition: Secondary | ICD-10-CM

## 2016-04-15 LAB — LIPID PANEL
CHOL/HDL RATIO: 3.2 ratio (ref <5.0)
Cholesterol: 130 mg/dL (ref <200)
HDL: 41 mg/dL (ref >40)
LDL CALC: 72 mg/dL (ref <100)
TRIGLYCERIDES: 85 mg/dL (ref <150)
VLDL: 17 mg/dL (ref <30)

## 2016-04-15 LAB — CBC WITH DIFFERENTIAL/PLATELET
BASOS ABS: 47 {cells}/uL (ref 0–200)
Basophils Relative: 1 %
EOS ABS: 893 {cells}/uL — AB (ref 15–500)
Eosinophils Relative: 19 %
HCT: 44.1 % (ref 38.5–50.0)
HEMOGLOBIN: 14.7 g/dL (ref 13.0–17.0)
LYMPHS ABS: 987 {cells}/uL (ref 850–3900)
Lymphocytes Relative: 21 %
MCH: 30.2 pg (ref 27.0–33.0)
MCHC: 33.3 g/dL (ref 32.0–36.0)
MCV: 90.7 fL (ref 80.0–100.0)
MONOS PCT: 14 %
MPV: 9.4 fL (ref 7.5–12.5)
Monocytes Absolute: 658 cells/uL (ref 200–950)
NEUTROS ABS: 2115 {cells}/uL (ref 1500–7800)
NEUTROS PCT: 45 %
Platelets: 206 10*3/uL (ref 140–400)
RBC: 4.86 MIL/uL (ref 4.20–5.80)
RDW: 13.5 % (ref 11.0–15.0)
WBC: 4.7 10*3/uL (ref 3.8–10.8)

## 2016-04-15 LAB — COMPLETE METABOLIC PANEL WITH GFR
ALBUMIN: 3.1 g/dL — AB (ref 3.6–5.1)
ALT: 8 U/L — ABNORMAL LOW (ref 9–46)
AST: 14 U/L (ref 10–35)
Alkaline Phosphatase: 63 U/L (ref 40–115)
BILIRUBIN TOTAL: 0.7 mg/dL (ref 0.2–1.2)
BUN: 19 mg/dL (ref 7–25)
CO2: 27 mmol/L (ref 20–31)
Calcium: 8.8 mg/dL (ref 8.6–10.3)
Chloride: 105 mmol/L (ref 98–110)
Creat: 0.89 mg/dL (ref 0.70–1.18)
GFR, EST NON AFRICAN AMERICAN: 87 mL/min (ref >=60)
GLUCOSE: 94 mg/dL (ref 70–99)
POTASSIUM: 4.2 mmol/L (ref 3.5–5.3)
Sodium: 138 mmol/L (ref 135–146)
TOTAL PROTEIN: 6.1 g/dL (ref 6.1–8.1)

## 2016-04-15 LAB — HEPATITIS C ANTIBODY: HCV Ab: NEGATIVE

## 2016-04-15 MED ORDER — OXYBUTYNIN CHLORIDE 5 MG PO TABS: 5.0000 mg | ORAL_TABLET | Freq: Two times a day (BID) | ORAL | 3 refills | Status: DC

## 2016-04-15 MED ORDER — TAMSULOSIN HCL 0.4 MG PO CAPS: 0.4000 mg | ORAL_CAPSULE | Freq: Every day | ORAL | 3 refills | Status: DC

## 2016-04-15 NOTE — Progress Notes (Signed)
Subjective:    Patient ID: Keith Watkins, male    DOB: 07/18/1945, 71 y.o.   MRN: [id number]  HPI Patient is here today requesting a refill on his Flomax and his oxybutynin. On the medication, his nocturia and urinary frequency is much improved. Off medication, he reports frequent urination, sudden urges to urinate, yet a weak urinary stream.  He denies any hematuria or dysuria. Review of his immunizations show that Prevnar, Pneumovax, and flu shot are up-to-date. He has had the shingles vaccine in the past. Tetanus shot was reportedly 4 years ago. Colonoscopy is up-to-date. He is due for fasting lab work. His blood pressure today is borderline. He denies any chest pain shortness of breath or dyspnea on exertion. He is also due for hepatitis C screening although he denies any exposure. Past Medical History:  Diagnosis Date  . Arthritis   . Back pain   . Cataract   . Enlarged prostate   . Hypertension    prior to gastric bypass- no meds taken  . Lumbar spinal stenosis    Past Surgical History:  Procedure Laterality Date  . APPENDECTOMY    . arthroscopic knee surgery    . CHOLECYSTECTOMY    . I&D EXTREMITY  08/07/2011   Procedure: IRRIGATION AND DEBRIDEMENT EXTREMITY;  Surgeon: Wylene Simmer, MD;  Location: [facility];  Service: Orthopedics;  Laterality: Left;  irrigation and debridement left foot abscess  . stomach stapled     Current Outpatient Prescriptions on File Prior to Visit  Medication Sig Dispense Refill  . oxybutynin (DITROPAN) 5 MG tablet TAKE 1 TABLET BY MOUTH TWICE A DAY 180 tablet 1  . tamsulosin (FLOMAX) 0.4 MG CAPS capsule Take 1 capsule (0.4 mg total) by mouth at bedtime. 90 capsule 3  . Multiple Vitamin (MULTIVITAMIN) tablet Take 1 tablet by mouth daily.    Marland Kitchen pyridoxine (B-6) 200 MG tablet Take 400 mg by mouth 2 (two) times daily.     No current facility-administered medications on file prior to visit.    No Known Allergies Social History   Social History  . Marital  status: Married    Spouse name: N/A  . Number of children: N/A  . Years of education: N/A   Occupational History  . Not on file.   Social History Main Topics  . Smoking status: Never Smoker  . Smokeless tobacco: Never Used  . Alcohol use No  . Drug use: No  . Sexual activity: Not on file   Other Topics Concern  . Not on file   Social History Narrative  . No narrative on file      Review of Systems  All other systems reviewed and are negative.      Objective:   Physical Exam  Constitutional: He appears well-developed and well-nourished.  Cardiovascular: Normal rate, regular rhythm and normal heart sounds.   No murmur heard. Pulmonary/Chest: Effort normal and breath sounds normal. No respiratory distress. He has no wheezes. He has no rales.  Abdominal: Soft. Bowel sounds are normal.  Vitals reviewed.         Assessment & Plan:  Benign prostatic hyperplasia with urinary frequency - Plan: CBC with Differential/Platelet, COMPLETE METABOLIC PANEL WITH GFR, PSA  Prehypertension - Plan: CBC with Differential/Platelet, COMPLETE METABOLIC PANEL WITH GFR, Lipid panel  Encounter for hepatitis C screening test for low risk patient - Plan: Hepatitis C Ab Reflex HCV RNA, QUANT  I will gladly refill the patient's medication. I will obtain fasting lab  work today including CBC, CMP, fasting lipid panel. I will check a PSA to screen for prostate cancer and also check/screen the patient for hepatitis C. He is scheduling a complete physical exam for one month from now. I will defer the prostate exam to complete physical exam

## 2016-04-15 NOTE — Addendum Note (Signed)
Addended by: Shary Decamp B on: 04/15/2016 08:32 AM   Modules accepted: Orders

## 2016-04-16 LAB — PSA: PSA: 0.3 ng/mL (ref <=4.0)

## 2016-05-21 ENCOUNTER — Encounter: Payer: Self-pay | Admitting: Family Medicine

## 2016-05-21 ENCOUNTER — Ambulatory Visit (INDEPENDENT_AMBULATORY_CARE_PROVIDER_SITE_OTHER): Payer: BLUE CROSS/BLUE SHIELD | Admitting: Family Medicine

## 2016-05-21 VITALS — BP 136/84 | HR 76 | Temp 97.8°F | Resp 18 | Ht 70.5 in | Wt 337.0 lb

## 2016-05-21 DIAGNOSIS — Z0000 Encounter for general adult medical examination without abnormal findings: Secondary | ICD-10-CM | POA: Diagnosis not present

## 2016-05-21 MED ORDER — DOXYCYCLINE HYCLATE 100 MG PO TABS: 100.0000 mg | ORAL_TABLET | Freq: Two times a day (BID) | ORAL | 0 refills | Status: DC

## 2016-05-21 NOTE — Progress Notes (Signed)
Subjective:    Patient ID: Keith Watkins, male    DOB: January 18, 1945, 71 y.o.   MRN: [id number]  HPI  Patient is here today for complete physical exam. Colonoscopy was performed 2 years ago and was significant only for diverticulosis. PSA was recently checked and was excellent. Immunizations are up-to-date. He has had a shingles vaccine as well as the tetanus shot at another facility Immunization History  Administered Date(s) Administered  . Influenza,inj,Quad PF,36+ Mos 02/06/2013, 02/14/2014  . Pneumococcal Conjugate-13 02/06/2013  . Pneumococcal Polysaccharide-23 08/09/2011    No visits with results within 1 Month(s) from this visit.  Latest known visit with results is:  Office Visit on 04/15/2016  Component Date Value Ref Range Status  . WBC 04/15/2016 4.7  3.8 - 10.8 K/uL Final  . RBC 04/15/2016 4.86  4.20 - 5.80 MIL/uL Final  . Hemoglobin 04/15/2016 14.7  13.0 - 17.0 g/dL Final  . HCT 04/15/2016 44.1  38.5 - 50.0 % Final  . MCV 04/15/2016 90.7  80.0 - 100.0 fL Final  . MCH 04/15/2016 30.2  27.0 - 33.0 pg Final  . MCHC 04/15/2016 33.3  32.0 - 36.0 g/dL Final  . RDW 04/15/2016 13.5  11.0 - 15.0 % Final  . Platelets 04/15/2016 206  140 - 400 K/uL Final  . MPV 04/15/2016 9.4  7.5 - 12.5 fL Final  . Neutro Abs 04/15/2016 2115  1,500 - 7,800 cells/uL Final  . Lymphs Abs 04/15/2016 987  850 - 3,900 cells/uL Final  . Monocytes Absolute 04/15/2016 658  200 - 950 cells/uL Final  . Eosinophils Absolute 04/15/2016 893* 15 - 500 cells/uL Final  . Basophils Absolute 04/15/2016 47  0 - 200 cells/uL Final  . Neutrophils Relative % 04/15/2016 45  % Final  . Lymphocytes Relative 04/15/2016 21  % Final  . Monocytes Relative 04/15/2016 14  % Final  . Eosinophils Relative 04/15/2016 19  % Final  . Basophils Relative 04/15/2016 1  % Final  . Smear Review 04/15/2016 Criteria for review not met   Final  . Sodium 04/15/2016 138  135 - 146 mmol/L Final  . Potassium 04/15/2016 4.2  3.5 - 5.3 mmol/L  Final  . Chloride 04/15/2016 105  98 - 110 mmol/L Final  . CO2 04/15/2016 27  20 - 31 mmol/L Final  . Glucose, Bld 04/15/2016 94  70 - 99 mg/dL Final  . BUN 04/15/2016 19  7 - 25 mg/dL Final  . Creat 04/15/2016 0.89  0.70 - 1.18 mg/dL Final   Comment:   For patients > or = 71 years of age: The upper reference limit for Creatinine is approximately 13% higher for people identified as African-American.     . Total Bilirubin 04/15/2016 0.7  0.2 - 1.2 mg/dL Final  . Alkaline Phosphatase 04/15/2016 63  40 - 115 U/L Final  . AST 04/15/2016 14  10 - 35 U/L Final  . ALT 04/15/2016 8* 9 - 46 U/L Final  . Total Protein 04/15/2016 6.1  6.1 - 8.1 g/dL Final  . Albumin 04/15/2016 3.1* 3.6 - 5.1 g/dL Final  . Calcium 04/15/2016 8.8  8.6 - 10.3 mg/dL Final  . GFR, Est African American 04/15/2016 >89  >=60 mL/min Final  . GFR, Est Non African American 04/15/2016 87  >=60 mL/min Final  . Cholesterol 04/15/2016 130  <200 mg/dL Final  . Triglycerides 04/15/2016 85  <150 mg/dL Final  . HDL 04/15/2016 41  >40 mg/dL Final  . Total CHOL/HDL Ratio 04/15/2016  3.2  <5.0 Ratio Final  . VLDL 04/15/2016 17  <30 mg/dL Final  . LDL Cholesterol 04/15/2016 72  <100 mg/dL Final  . PSA 04/15/2016 0.3  <=4.0 ng/mL Final   Comment:   The total PSA value from this assay system is standardized against the WHO standard. The test result will be approximately 20% lower when compared to the equimolar-standardized total PSA (Beckman Coulter). Comparison of serial PSA results should be interpreted with this fact in mind.   This test was performed using the Siemens chemiluminescent method. Values obtained from different assay methods cannot be used interchangeably. PSA levels, regardless of value, should not be interpreted as absolute evidence of the presence or absence of disease.     Marland Kitchen HCV Ab 04/15/2016 NEGATIVE  NEGATIVE Final   Past Medical History:  Diagnosis Date  . Arthritis   . Back pain   . Cataract   .  Enlarged prostate   . Hypertension    prior to gastric bypass- no meds taken  . Lumbar spinal stenosis    Past Surgical History:  Procedure Laterality Date  . APPENDECTOMY    . arthroscopic knee surgery    . CHOLECYSTECTOMY    . I&D EXTREMITY  08/07/2011   Procedure: IRRIGATION AND DEBRIDEMENT EXTREMITY;  Surgeon: Wylene Simmer, MD;  Location: [facility];  Service: Orthopedics;  Laterality: Left;  irrigation and debridement left foot abscess  . stomach stapled     Current Outpatient Prescriptions on File Prior to Visit  Medication Sig Dispense Refill  . oxybutynin (DITROPAN) 5 MG tablet Take 1 tablet (5 mg total) by mouth 2 (two) times daily. 180 tablet 3  . tamsulosin (FLOMAX) 0.4 MG CAPS capsule Take 1 capsule (0.4 mg total) by mouth at bedtime. 90 capsule 3   No current facility-administered medications on file prior to visit.    No Known Allergies Social History   Social History  . Marital status: Married    Spouse name: N/A  . Number of children: N/A  . Years of education: N/A   Occupational History  . Not on file.   Social History Main Topics  . Smoking status: Never Smoker  . Smokeless tobacco: Never Used  . Alcohol use No  . Drug use: No  . Sexual activity: Not on file   Other Topics Concern  . Not on file   Social History Narrative  . No narrative on file   Family History  Problem Relation Age of Onset  . Diabetes Other   . Stroke Other   . Hypertension Other   . Coronary artery disease Other   . Heart failure Other   . Heart disease Mother   . Cancer Sister        Breast  . Breast cancer Sister   . Hypertension Brother   . Hypertension Maternal Grandmother   . Stroke Maternal Grandmother   . Heart disease Maternal Grandfather   . Hypertension Maternal Grandfather   . Diabetes Sister   . Heart disease Sister   . Hyperlipidemia Sister   . Hypertension Sister   . Stroke Sister   . Colon cancer Neg Hx   . Esophageal cancer Neg Hx   . Stomach cancer  Neg Hx   . Rectal cancer Neg Hx       Review of Systems  All other systems reviewed and are negative.      Objective:   Physical Exam  Constitutional: He is oriented to person, place, and time. He  appears well-developed and well-nourished. No distress.  HENT:  Head: Normocephalic and atraumatic.  Right Ear: External ear normal.  Left Ear: External ear normal.  Nose: Nose normal.  Mouth/Throat: Oropharynx is clear and moist. No oropharyngeal exudate.  Eyes: Conjunctivae and EOM are normal. Pupils are equal, round, and reactive to light. Right eye exhibits no discharge. Left eye exhibits no discharge. No scleral icterus.  Neck: Normal range of motion. Neck supple. No JVD present. No tracheal deviation present. No thyromegaly present.  Cardiovascular: Normal rate, regular rhythm, normal heart sounds and intact distal pulses.  Exam reveals no gallop and no friction rub.   No murmur heard. Pulmonary/Chest: Effort normal and breath sounds normal. No stridor. No respiratory distress. He has no wheezes. He has no rales. He exhibits no tenderness.  Abdominal: Soft. Bowel sounds are normal. He exhibits no distension and no mass. There is no tenderness. There is no rebound and no guarding.  Musculoskeletal: Normal range of motion. He exhibits no edema or tenderness.  Lymphadenopathy:    He has no cervical adenopathy.  Neurological: He is alert and oriented to person, place, and time. He has normal reflexes. No cranial nerve deficit. He exhibits normal muscle tone. Coordination normal.  Skin: No rash noted. He is not diaphoretic. No erythema.  Psychiatric: He has a normal mood and affect. His behavior is normal. Judgment and thought content normal.  Vitals reviewed.         Assessment & Plan:  Routine general medical examination at a health care facility   Physical exam is significant for poor dentition and morbid obesity. I recommended weight loss and that the patient meet with a  dentist.  Immunizations are up-to-date. Cancer screening is up-to-date. On exam, his left testicle is slightly larger than his right testicle. There appears to be slight swelling of the epididymis and it is mildly tender to palpation. In the past had a history of epididymitis. He states it feels similar to the [place]. I will start the patient on doxycycline 100 mg by mouth twice a day for 10 days to cover epididymitis. If no better, recommend an ultrasound of the scrotum. Continue to encourage exercise and diet.

## 2016-08-21 DIAGNOSIS — L03116 Cellulitis of left lower limb: Secondary | ICD-10-CM | POA: Insufficient documentation

## 2017-04-19 ENCOUNTER — Other Ambulatory Visit: Payer: Medicare HMO

## 2017-04-19 DIAGNOSIS — N401 Enlarged prostate with lower urinary tract symptoms: Secondary | ICD-10-CM

## 2017-04-19 DIAGNOSIS — R030 Elevated blood-pressure reading, without diagnosis of hypertension: Secondary | ICD-10-CM | POA: Diagnosis not present

## 2017-04-19 DIAGNOSIS — R350 Frequency of micturition: Secondary | ICD-10-CM

## 2017-04-19 DIAGNOSIS — Z0000 Encounter for general adult medical examination without abnormal findings: Secondary | ICD-10-CM | POA: Diagnosis not present

## 2017-04-19 DIAGNOSIS — Z13220 Encounter for screening for lipoid disorders: Secondary | ICD-10-CM | POA: Diagnosis not present

## 2017-04-20 LAB — COMPREHENSIVE METABOLIC PANEL
AG RATIO: 1.4 (calc) (ref 1.0–2.5)
ALKALINE PHOSPHATASE (APISO): 72 U/L (ref 40–115)
ALT: 9 U/L (ref 9–46)
AST: 18 U/L (ref 10–35)
Albumin: 3.7 g/dL (ref 3.6–5.1)
BILIRUBIN TOTAL: 1.4 mg/dL — AB (ref 0.2–1.2)
BUN: 15 mg/dL (ref 7–25)
CALCIUM: 9 mg/dL (ref 8.6–10.3)
CO2: 31 mmol/L (ref 20–32)
Chloride: 102 mmol/L (ref 98–110)
Creat: 0.83 mg/dL (ref 0.70–1.18)
Globulin: 2.7 g/dL (calc) (ref 1.9–3.7)
Glucose, Bld: 100 mg/dL — ABNORMAL HIGH (ref 65–99)
Potassium: 4.1 mmol/L (ref 3.5–5.3)
SODIUM: 139 mmol/L (ref 135–146)
Total Protein: 6.4 g/dL (ref 6.1–8.1)

## 2017-04-20 LAB — CBC WITH DIFFERENTIAL/PLATELET
BASOS ABS: 60 {cells}/uL (ref 0–200)
Basophils Relative: 1.3 %
EOS ABS: 943 {cells}/uL — AB (ref 15–500)
Eosinophils Relative: 20.5 %
HEMATOCRIT: 41.3 % (ref 38.5–50.0)
HEMOGLOBIN: 14.2 g/dL (ref 13.2–17.1)
LYMPHS ABS: 1343 {cells}/uL (ref 850–3900)
MCH: 29.6 pg (ref 27.0–33.0)
MCHC: 34.4 g/dL (ref 32.0–36.0)
MCV: 86 fL (ref 80.0–100.0)
MPV: 10 fL (ref 7.5–12.5)
Monocytes Relative: 13.3 %
NEUTROS ABS: 1642 {cells}/uL (ref 1500–7800)
NEUTROS PCT: 35.7 %
Platelets: 246 10*3/uL (ref 140–400)
RBC: 4.8 10*6/uL (ref 4.20–5.80)
RDW: 12.8 % (ref 11.0–15.0)
Total Lymphocyte: 29.2 %
WBC mixed population: 612 cells/uL (ref 200–950)
WBC: 4.6 10*3/uL (ref 3.8–10.8)

## 2017-04-20 LAB — LIPID PANEL
CHOLESTEROL: 129 mg/dL (ref <200)
HDL: 40 mg/dL — AB (ref >40)
LDL Cholesterol (Calc): 73 mg/dL (calc)
Non-HDL Cholesterol (Calc): 89 mg/dL (calc) (ref <130)
TRIGLYCERIDES: 84 mg/dL (ref <150)
Total CHOL/HDL Ratio: 3.2 (calc) (ref <5.0)

## 2017-04-20 LAB — PSA: PSA: 0.3 ng/mL (ref <=4.0)

## 2017-04-21 ENCOUNTER — Ambulatory Visit (INDEPENDENT_AMBULATORY_CARE_PROVIDER_SITE_OTHER): Payer: Medicare HMO | Admitting: Family Medicine

## 2017-04-21 ENCOUNTER — Encounter: Payer: Self-pay | Admitting: Family Medicine

## 2017-04-21 ENCOUNTER — Other Ambulatory Visit: Payer: Self-pay

## 2017-04-21 VITALS — BP 136/88 | HR 85 | Temp 98.3°F | Resp 16 | Ht 69.75 in | Wt 336.0 lb

## 2017-04-21 DIAGNOSIS — R350 Frequency of micturition: Secondary | ICD-10-CM

## 2017-04-21 DIAGNOSIS — N401 Enlarged prostate with lower urinary tract symptoms: Secondary | ICD-10-CM | POA: Diagnosis not present

## 2017-04-21 DIAGNOSIS — Z0000 Encounter for general adult medical examination without abnormal findings: Secondary | ICD-10-CM

## 2017-04-21 MED ORDER — TAMSULOSIN HCL 0.4 MG PO CAPS: 0.4000 mg | ORAL_CAPSULE | Freq: Every day | ORAL | 3 refills | Status: DC

## 2017-04-21 NOTE — Progress Notes (Signed)
Subjective:    Patient ID: Keith Watkins, male    DOB: 01/29/1945, 72 y.o.   MRN: [id number]  HPI  Patient is here today for complete physical exam. Colonoscopy was performed 3 years ago and was significant only for diverticulosis.  Patient's immunizations are up-to-date.  He has had Pneumovax 23, Prevnar 13.  He reports that he had a shingles vaccine at a local pharmacy.  His flu shot is up-to-date.  He is due for prostate cancer screening.  He declines digital rectal exam however PSA has been unchanged now for more than 12 months.  It was 0.3.  He has no specific concerns other than continued symptoms of overactive bladder.  He is seen very limited benefit from oxybutynin twice daily.  It has cut his nocturia to one episode per evening. Immunization History  Administered Date(s) Administered  . Influenza,inj,Quad PF,6+ Mos 02/06/2013, 02/14/2014  . Influenza-Unspecified 01/20/2017  . Pneumococcal Conjugate-13 02/06/2013  . Pneumococcal Polysaccharide-23 08/09/2011    Lab on 04/19/2017  Component Date Value Ref Range Status  . WBC 04/19/2017 4.6  3.8 - 10.8 Thousand/uL Final  . RBC 04/19/2017 4.80  4.20 - 5.80 Million/uL Final  . Hemoglobin 04/19/2017 14.2  13.2 - 17.1 g/dL Final  . HCT 04/19/2017 41.3  38.5 - 50.0 % Final  . MCV 04/19/2017 86.0  80.0 - 100.0 fL Final  . MCH 04/19/2017 29.6  27.0 - 33.0 pg Final  . MCHC 04/19/2017 34.4  32.0 - 36.0 g/dL Final  . RDW 04/19/2017 12.8  11.0 - 15.0 % Final  . Platelets 04/19/2017 246  140 - 400 Thousand/uL Final  . MPV 04/19/2017 10.0  7.5 - 12.5 fL Final  . Neutro Abs 04/19/2017 1,642  1,500 - 7,800 cells/uL Final  . Lymphs Abs 04/19/2017 1,343  850 - 3,900 cells/uL Final  . WBC mixed population 04/19/2017 612  200 - 950 cells/uL Final  . Eosinophils Absolute 04/19/2017 943* 15 - 500 cells/uL Final  . Basophils Absolute 04/19/2017 60  0 - 200 cells/uL Final  . Neutrophils Relative % 04/19/2017 35.7  % Final  . Total Lymphocyte  04/19/2017 29.2  % Final  . Monocytes Relative 04/19/2017 13.3  % Final  . Eosinophils Relative 04/19/2017 20.5  % Final  . Basophils Relative 04/19/2017 1.3  % Final  . Glucose, Bld 04/19/2017 100* 65 - 99 mg/dL Final   Comment: .            Fasting reference interval . For someone without known diabetes, a glucose value between 100 and 125 mg/dL is consistent with prediabetes and should be confirmed with a follow-up test. .   . BUN 04/19/2017 15  7 - 25 mg/dL Final  . Creat 04/19/2017 0.83  0.70 - 1.18 mg/dL Final   Comment: For patients >60 years of age, the reference limit for Creatinine is approximately 13% higher for people identified as African-American. .   Havery Moros Ratio 70/62/3762 NOT APPLICABLE  6 - 22 (calc) Final  . Sodium 04/19/2017 139  135 - 146 mmol/L Final  . Potassium 04/19/2017 4.1  3.5 - 5.3 mmol/L Final  . Chloride 04/19/2017 102  98 - 110 mmol/L Final  . CO2 04/19/2017 31  20 - 32 mmol/L Final  . Calcium 04/19/2017 9.0  8.6 - 10.3 mg/dL Final  . Total Protein 04/19/2017 6.4  6.1 - 8.1 g/dL Final  . Albumin 04/19/2017 3.7  3.6 - 5.1 g/dL Final  . Globulin 04/19/2017 2.7  1.9 -  3.7 g/dL (calc) Final  . AG Ratio 04/19/2017 1.4  1.0 - 2.5 (calc) Final  . Total Bilirubin 04/19/2017 1.4* 0.2 - 1.2 mg/dL Final  . Alkaline phosphatase (APISO) 04/19/2017 72  40 - 115 U/L Final  . AST 04/19/2017 18  10 - 35 U/L Final  . ALT 04/19/2017 9  9 - 46 U/L Final  . Cholesterol 04/19/2017 129  <200 mg/dL Final  . HDL 04/19/2017 40* >40 mg/dL Final  . Triglycerides 04/19/2017 84  <150 mg/dL Final  . LDL Cholesterol (Calc) 04/19/2017 73  mg/dL (calc) Final   Comment: Reference range: <100 . Desirable range <100 mg/dL for primary prevention;   <70 mg/dL for patients with CHD or diabetic patients  with > or = 2 CHD risk factors. Marland Kitchen LDL-C is now calculated using the Martin-Hopkins  calculation, which is a validated novel method providing  better accuracy than the  Friedewald equation in the  estimation of LDL-C.  Cresenciano Genre et al. Annamaria Helling. 9211;941(74): 2061-2068  (http://education.[facility].com/faq/FAQ164)   . Total CHOL/HDL Ratio 04/19/2017 3.2  <5.0 (calc) Final  . Non-HDL Cholesterol (Calc) 04/19/2017 89  <130 mg/dL (calc) Final   Comment: For patients with diabetes plus 1 major ASCVD risk  factor, treating to a non-HDL-C goal of <100 mg/dL  (LDL-C of <70 mg/dL) is considered a therapeutic  option.   Marland Kitchen PSA 04/19/2017 0.3  < OR = 4.0 ng/mL Final   Comment: The total PSA value from this assay system is  standardized against the WHO standard. The test  result will be approximately 20% lower when compared  to the equimolar-standardized total PSA (Beckman  Coulter). Comparison of serial PSA results should be  interpreted with this fact in mind. . This test was performed using the Siemens  chemiluminescent method. Values obtained from  different assay methods cannot be used interchangeably. PSA levels, regardless of value, should not be interpreted as absolute evidence of the presence or absence of disease.    Past Medical History:  Diagnosis Date  . Arthritis   . Back pain   . Cataract   . Enlarged prostate   . Hypertension    prior to gastric bypass- no meds taken  . Lumbar spinal stenosis    Past Surgical History:  Procedure Laterality Date  . APPENDECTOMY    . arthroscopic knee surgery    . CHOLECYSTECTOMY    . I&D EXTREMITY  08/07/2011   Procedure: IRRIGATION AND DEBRIDEMENT EXTREMITY;  Surgeon: Wylene Simmer, MD;  Location: [facility];  Service: Orthopedics;  Laterality: Left;  irrigation and debridement left foot abscess  . stomach stapled     Current Outpatient Medications on File Prior to Visit  Medication Sig Dispense Refill  . oxybutynin (DITROPAN) 5 MG tablet Take 1 tablet (5 mg total) by mouth 2 (two) times daily. 180 tablet 3   No current facility-administered medications on file prior to visit.    No Known  Allergies Social History   Socioeconomic History  . Marital status: Married    Spouse name: Not on file  . Number of children: Not on file  . Years of education: Not on file  . Highest education level: Not on file  Occupational History  . Not on file  Social Needs  . Financial resource strain: Not on file  . Food insecurity:    Worry: Not on file    Inability: Not on file  . Transportation needs:    Medical: Not on file    Non-medical:  Not on file  Tobacco Use  . Smoking status: Never Smoker  . Smokeless tobacco: Never Used  Substance and Sexual Activity  . Alcohol use: No  . Drug use: No  . Sexual activity: Not on file  Lifestyle  . Physical activity:    Days per week: Not on file    Minutes per session: Not on file  . Stress: Not on file  Relationships  . Social connections:    Talks on phone: Not on file    Gets together: Not on file    Attends religious service: Not on file    Active member of club or organization: Not on file    Attends meetings of clubs or organizations: Not on file    Relationship status: Not on file  . Intimate partner violence:    Fear of current or ex partner: Not on file    Emotionally abused: Not on file    Physically abused: Not on file    Forced sexual activity: Not on file  Other Topics Concern  . Not on file  Social History Narrative  . Not on file   Family History  Problem Relation Age of Onset  . Diabetes Other   . Stroke Other   . Hypertension Other   . Coronary artery disease Other   . Heart failure Other   . Heart disease Mother   . Cancer Sister        Breast  . Breast cancer Sister   . Hypertension Brother   . Hypertension Maternal Grandmother   . Stroke Maternal Grandmother   . Heart disease Maternal Grandfather   . Hypertension Maternal Grandfather   . Diabetes Sister   . Heart disease Sister   . Hyperlipidemia Sister   . Hypertension Sister   . Stroke Sister   . Colon cancer Neg Hx   . Esophageal cancer  Neg Hx   . Stomach cancer Neg Hx   . Rectal cancer Neg Hx       Review of Systems  All other systems reviewed and are negative.      Objective:   Physical Exam  Constitutional: He is oriented to person, place, and time. He appears well-developed and well-nourished. No distress.  HENT:  Head: Normocephalic and atraumatic.  Right Ear: External ear normal.  Left Ear: External ear normal.  Nose: Nose normal.  Mouth/Throat: Oropharynx is clear and moist. No oropharyngeal exudate.  Eyes: Pupils are equal, round, and reactive to light. Conjunctivae and EOM are normal. Right eye exhibits no discharge. Left eye exhibits no discharge. No scleral icterus.  Neck: Normal range of motion. Neck supple. No JVD present. No tracheal deviation present. No thyromegaly present.  Cardiovascular: Normal rate, regular rhythm, normal heart sounds and intact distal pulses. Exam reveals no gallop and no friction rub.  No murmur heard. Pulmonary/Chest: Effort normal and breath sounds normal. No stridor. No respiratory distress. He has no wheezes. He has no rales. He exhibits no tenderness.  Abdominal: Soft. Bowel sounds are normal. He exhibits no distension and no mass. There is no tenderness. There is no rebound and no guarding.  Musculoskeletal: Normal range of motion. He exhibits no edema or tenderness.  Lymphadenopathy:    He has no cervical adenopathy.  Neurological: He is alert and oriented to person, place, and time. He has normal reflexes. No cranial nerve deficit. He exhibits normal muscle tone. Coordination normal.  Skin: No rash noted. He is not diaphoretic. No erythema.  Psychiatric: He  has a normal mood and affect. His behavior is normal. Judgment and thought content normal.  Vitals reviewed.         Assessment & Plan:  Routine general medical examination at a health care facility  Benign prostatic hyperplasia with urinary frequency   Physical exam is significant for poor dentition and  morbid obesity. I recommended weight loss and that the patient meet with a dentist.  Immunizations are up-to-date. Cancer screening is up-to-date.  PSA is excellent.  Testicular exam today reveals what appears to be an epididymal cyst versus a varicocele.  I appreciate no testicular mass on the left side.  Blood pressure today is adequately controlled.  The remainder of his lab work is excellent.  His bilirubin is slightly elevated at 1.4.  He has never experienced this before and he is asymptomatic.  I have recommended returning for a CMP next week to monitor his bilirubin.  If persistently elevated, we may need to proceed with imaging.  Discontinue oxybutynin and replace with myrbetriq 25 mg a day to see if symptoms are better controlled.  I believe the majority of his symptoms are likely related to overactive bladder rather than BPH given his relatively low PSA

## 2017-05-19 ENCOUNTER — Telehealth: Payer: Self-pay | Admitting: Family Medicine

## 2017-05-19 MED ORDER — MIRABEGRON ER 50 MG PO TB24: 50.0000 mg | ORAL_TABLET | Freq: Every day | ORAL | 5 refills | Status: DC

## 2017-05-19 NOTE — Telephone Encounter (Signed)
Patient called in today stating that he is almost out of the samples of Mybetriq. States that it has helped with urinary frequency at night time. He is now only waking up twice per night, however the urgency is still there and at times when he gets up to go urinate in the middle of the night that he often does not make it to the bathroom due to urgency. He would like to know if you want to send in a prescription for Mybetriq or send in another prescription for Ditropan. Please advise?

## 2017-05-19 NOTE — Telephone Encounter (Signed)
I would do mybetriq 50 mg poqday

## 2017-05-19 NOTE — Telephone Encounter (Signed)
Pt aware of recommendation via vm and med sent to pharm

## 2017-05-24 ENCOUNTER — Other Ambulatory Visit: Payer: Self-pay | Admitting: Family Medicine

## 2017-05-24 ENCOUNTER — Telehealth: Payer: Self-pay | Admitting: Family Medicine

## 2017-05-24 ENCOUNTER — Other Ambulatory Visit: Payer: Medicare HMO

## 2017-05-24 DIAGNOSIS — I10 Essential (primary) hypertension: Secondary | ICD-10-CM

## 2017-05-24 LAB — COMPREHENSIVE METABOLIC PANEL
AG Ratio: 1.2 (calc) (ref 1.0–2.5)
ALBUMIN MSPROF: 3.4 g/dL — AB (ref 3.6–5.1)
ALT: 9 U/L (ref 9–46)
AST: 15 U/L (ref 10–35)
Alkaline phosphatase (APISO): 67 U/L (ref 40–115)
BUN: 19 mg/dL (ref 7–25)
CHLORIDE: 107 mmol/L (ref 98–110)
CO2: 27 mmol/L (ref 20–32)
CREATININE: 0.84 mg/dL (ref 0.70–1.18)
Calcium: 8.9 mg/dL (ref 8.6–10.3)
GLOBULIN: 2.9 g/dL (ref 1.9–3.7)
Glucose, Bld: 97 mg/dL (ref 65–99)
POTASSIUM: 4.2 mmol/L (ref 3.5–5.3)
SODIUM: 141 mmol/L (ref 135–146)
TOTAL PROTEIN: 6.3 g/dL (ref 6.1–8.1)
Total Bilirubin: 0.9 mg/dL (ref 0.2–1.2)

## 2017-05-24 LAB — EXTRA LAV TOP TUBE

## 2017-05-24 MED ORDER — OXYBUTYNIN CHLORIDE 5 MG PO TABS: 5.0000 mg | ORAL_TABLET | Freq: Two times a day (BID) | ORAL | 3 refills | Status: DC

## 2017-05-24 NOTE — Telephone Encounter (Signed)
Pt concerned about the myrbetriq that was prescribed please call.

## 2017-05-24 NOTE — Telephone Encounter (Signed)
Ok to use oxybutynin 5 mg pobid

## 2017-05-24 NOTE — Telephone Encounter (Signed)
Call placed to patient and patient made aware per VM.   Prescription sent to pharmacy.  

## 2017-05-24 NOTE — Telephone Encounter (Signed)
Call placed to patient to inquire.   Reports that he has tried Myrbetriq for OAB. States that he has not noted any marked improvement from the Ditropan. States that Ditropan is available at no-cost, while Myrbetriq is >$400. Reports that if medication worked, he has not problem paying the cost, but he does not feel that there was enough improvement to justify spending that much per month.  Requested MD to advise.

## 2017-06-21 ENCOUNTER — Ambulatory Visit (INDEPENDENT_AMBULATORY_CARE_PROVIDER_SITE_OTHER): Payer: Medicare HMO | Admitting: Family Medicine

## 2017-06-21 ENCOUNTER — Encounter: Payer: Self-pay | Admitting: Family Medicine

## 2017-06-21 VITALS — BP 146/92 | HR 78 | Temp 98.6°F | Resp 18 | Ht 70.5 in | Wt 340.0 lb

## 2017-06-21 DIAGNOSIS — S80862A Insect bite (nonvenomous), left lower leg, initial encounter: Secondary | ICD-10-CM

## 2017-06-21 DIAGNOSIS — D692 Other nonthrombocytopenic purpura: Secondary | ICD-10-CM | POA: Diagnosis not present

## 2017-06-21 DIAGNOSIS — W57XXXA Bitten or stung by nonvenomous insect and other nonvenomous arthropods, initial encounter: Secondary | ICD-10-CM | POA: Diagnosis not present

## 2017-06-21 MED ORDER — DOXYCYCLINE HYCLATE 100 MG PO TABS: 100.0000 mg | ORAL_TABLET | Freq: Two times a day (BID) | ORAL | 0 refills | Status: DC

## 2017-06-21 NOTE — Progress Notes (Signed)
Subjective:    Patient ID: Keith Watkins, male    DOB: March 16, 1945, 72 y.o.   MRN: [id number]  HPI Patient was mowing Saturday.  He was mowing through high brush and weeds and briars and branches.  Saturday evening, he noticed a wound on the lateral aspect of his left ankle.  The wound is now on an elliptical shape purpura there is approximately 5 cm x 2 cm.  There is a central non-blanchable purple area surrounded by a coalescent patch of purplish petechiae all confined within the diameter listed above.  It is not painful.  It does not itch.  There is a central wound that appears to be either an insect bite or a puncture wound.  He also has a similar Perper on his right forearm that came from a prior scratch.  He denies any itching at the site.  He denies any warmth or pain at the site.  He denies any fever or signs of systemic illness. Past Medical History:  Diagnosis Date  . Arthritis   . Back pain   . Cataract   . Enlarged prostate   . Hypertension    prior to gastric bypass- no meds taken  . Lumbar spinal stenosis    Past Surgical History:  Procedure Laterality Date  . APPENDECTOMY    . arthroscopic knee surgery    . CHOLECYSTECTOMY    . I&D EXTREMITY  08/07/2011   Procedure: IRRIGATION AND DEBRIDEMENT EXTREMITY;  Surgeon: Wylene Simmer, MD;  Location: [facility];  Service: Orthopedics;  Laterality: Left;  irrigation and debridement left foot abscess  . stomach stapled     Current Outpatient Medications on File Prior to Visit  Medication Sig Dispense Refill  . oxybutynin (DITROPAN) 5 MG tablet Take 1 tablet (5 mg total) by mouth 2 (two) times daily. 180 tablet 3  . tamsulosin (FLOMAX) 0.4 MG CAPS capsule Take 1 capsule (0.4 mg total) by mouth at bedtime. 90 capsule 3   No current facility-administered medications on file prior to visit.    No Known Allergies Social History   Socioeconomic History  . Marital status: Married    Spouse name: Not on file  . Number of children: Not on  file  . Years of education: Not on file  . Highest education level: Not on file  Occupational History  . Not on file  Social Needs  . Financial resource strain: Not on file  . Food insecurity:    Worry: Not on file    Inability: Not on file  . Transportation needs:    Medical: Not on file    Non-medical: Not on file  Tobacco Use  . Smoking status: Never Smoker  . Smokeless tobacco: Never Used  Substance and Sexual Activity  . Alcohol use: No  . Drug use: No  . Sexual activity: Not on file  Lifestyle  . Physical activity:    Days per week: Not on file    Minutes per session: Not on file  . Stress: Not on file  Relationships  . Social connections:    Talks on phone: Not on file    Gets together: Not on file    Attends religious service: Not on file    Active member of club or organization: Not on file    Attends meetings of clubs or organizations: Not on file    Relationship status: Not on file  . Intimate partner violence:    Fear of current or ex partner: Not  on file    Emotionally abused: Not on file    Physically abused: Not on file    Forced sexual activity: Not on file  Other Topics Concern  . Not on file  Social History Narrative  . Not on file      Review of Systems  All other systems reviewed and are negative.      Objective:   Physical Exam  Constitutional: He appears well-developed and well-nourished.  Cardiovascular: Normal rate, regular rhythm and normal heart sounds.  Pulmonary/Chest: Effort normal and breath sounds normal.  Musculoskeletal: He exhibits edema.       Left lower leg: He exhibits no tenderness, no swelling, no edema and no deformity.       Legs: Skin: Rash noted. No erythema.  Vitals reviewed.         Assessment & Plan:  Purpura, Insect bite of left lower leg, initial encounter - Plan: B. burgdorfi antibodies by WB  I believe this is most likely a purpura due to pressure and trauma most likely from a branch or a bar  similar to the lesion on his forearm.  However I cannot rule out an insect bite and given the high wheeze that he was mowing through, obviously a tick bite or a brown recluse spider bite would be of concern.  I have recommended clinical monitoring.  I will check Lyme titers.  If the lesion gradually reabsorbs and fades over the next week, no treatment is necessary.  If the lesion begins to spread, develop a halo with central clearing, I want to treat him for possible early Lyme disease with doxycycline 100 mg p.o. twice daily for 10 days.  If it begins to become secondarily infected, he can also use doxycycline for secondary cellulitis.  Otherwise keep the leg elevated as much as possible and keep the area clean and dry.  Patient agrees with this plan.  Recheck immediately if there is any concern

## 2017-06-24 LAB — B. BURGDORFI ANTIBODIES BY WB
B BURGDORFERI IGG ABS (IB): NEGATIVE
B burgdorferi IgM Abs (IB): NEGATIVE
LYME DISEASE 28 KD IGG: NONREACTIVE
LYME DISEASE 30 KD IGG: NONREACTIVE
LYME DISEASE 39 KD IGG: NONREACTIVE
LYME DISEASE 41 KD IGM: NONREACTIVE
LYME DISEASE 45 KD IGG: NONREACTIVE
LYME DISEASE 66 KD IGG: REACTIVE — AB
LYME DISEASE 93 KD IGG: NONREACTIVE
Lyme Disease 18 kD IgG: NONREACTIVE
Lyme Disease 23 kD IgG: NONREACTIVE
Lyme Disease 23 kD IgM: NONREACTIVE
Lyme Disease 39 kD IgM: NONREACTIVE
Lyme Disease 41 kD IgG: REACTIVE — AB
Lyme Disease 58 kD IgG: REACTIVE — AB

## 2018-04-17 ENCOUNTER — Other Ambulatory Visit: Payer: Self-pay | Admitting: Family Medicine

## 2018-04-17 MED ORDER — TAMSULOSIN HCL 0.4 MG PO CAPS: 0.4000 mg | ORAL_CAPSULE | Freq: Every day | ORAL | 0 refills | Status: DC

## 2018-04-17 MED ORDER — OXYBUTYNIN CHLORIDE 5 MG PO TABS: 5.0000 mg | ORAL_TABLET | Freq: Two times a day (BID) | ORAL | 0 refills | Status: DC

## 2018-06-20 ENCOUNTER — Other Ambulatory Visit: Payer: Medicare HMO

## 2018-06-20 ENCOUNTER — Other Ambulatory Visit: Payer: Self-pay

## 2018-06-20 DIAGNOSIS — Z0000 Encounter for general adult medical examination without abnormal findings: Secondary | ICD-10-CM | POA: Diagnosis not present

## 2018-06-20 DIAGNOSIS — N401 Enlarged prostate with lower urinary tract symptoms: Secondary | ICD-10-CM | POA: Diagnosis not present

## 2018-06-20 DIAGNOSIS — Z13220 Encounter for screening for lipoid disorders: Secondary | ICD-10-CM

## 2018-06-20 DIAGNOSIS — R350 Frequency of micturition: Secondary | ICD-10-CM | POA: Diagnosis not present

## 2018-06-20 DIAGNOSIS — Z125 Encounter for screening for malignant neoplasm of prostate: Secondary | ICD-10-CM | POA: Diagnosis not present

## 2018-06-20 DIAGNOSIS — I10 Essential (primary) hypertension: Secondary | ICD-10-CM | POA: Diagnosis not present

## 2018-06-21 ENCOUNTER — Ambulatory Visit (INDEPENDENT_AMBULATORY_CARE_PROVIDER_SITE_OTHER): Payer: Medicare HMO | Admitting: Family Medicine

## 2018-06-21 ENCOUNTER — Encounter: Payer: Self-pay | Admitting: Family Medicine

## 2018-06-21 VITALS — BP 124/66 | HR 80 | Temp 98.4°F | Resp 18 | Ht 71.0 in | Wt 338.8 lb

## 2018-06-21 DIAGNOSIS — Z6841 Body Mass Index (BMI) 40.0 and over, adult: Secondary | ICD-10-CM | POA: Diagnosis not present

## 2018-06-21 DIAGNOSIS — R350 Frequency of micturition: Secondary | ICD-10-CM | POA: Diagnosis not present

## 2018-06-21 DIAGNOSIS — I10 Essential (primary) hypertension: Secondary | ICD-10-CM

## 2018-06-21 DIAGNOSIS — N3941 Urge incontinence: Secondary | ICD-10-CM

## 2018-06-21 DIAGNOSIS — N401 Enlarged prostate with lower urinary tract symptoms: Secondary | ICD-10-CM | POA: Diagnosis not present

## 2018-06-21 DIAGNOSIS — Z0000 Encounter for general adult medical examination without abnormal findings: Secondary | ICD-10-CM

## 2018-06-21 DIAGNOSIS — R600 Localized edema: Secondary | ICD-10-CM

## 2018-06-21 DIAGNOSIS — Z0001 Encounter for general adult medical examination with abnormal findings: Secondary | ICD-10-CM

## 2018-06-21 LAB — COMPLETE METABOLIC PANEL WITH GFR
AG Ratio: 1.2 (calc) (ref 1.0–2.5)
ALT: 8 U/L — ABNORMAL LOW (ref 9–46)
AST: 15 U/L (ref 10–35)
Albumin: 3.3 g/dL — ABNORMAL LOW (ref 3.6–5.1)
Alkaline phosphatase (APISO): 73 U/L (ref 35–144)
BUN: 16 mg/dL (ref 7–25)
CO2: 27 mmol/L (ref 20–32)
Calcium: 8.6 mg/dL (ref 8.6–10.3)
Chloride: 104 mmol/L (ref 98–110)
Creat: 0.88 mg/dL (ref 0.70–1.18)
GFR, Est African American: 99 mL/min/{1.73_m2} (ref >=60)
GFR, Est Non African American: 86 mL/min/{1.73_m2} (ref >=60)
Globulin: 2.8 g/dL (calc) (ref 1.9–3.7)
Glucose, Bld: 99 mg/dL (ref 65–99)
Potassium: 4.4 mmol/L (ref 3.5–5.3)
Sodium: 141 mmol/L (ref 135–146)
Total Bilirubin: 0.9 mg/dL (ref 0.2–1.2)
Total Protein: 6.1 g/dL (ref 6.1–8.1)

## 2018-06-21 LAB — CBC WITH DIFFERENTIAL/PLATELET
Absolute Monocytes: 689 cells/uL (ref 200–950)
Basophils Absolute: 92 cells/uL (ref 0–200)
Basophils Relative: 1.8 %
Eosinophils Absolute: 648 cells/uL — ABNORMAL HIGH (ref 15–500)
Eosinophils Relative: 12.7 %
HCT: 44.1 % (ref 38.5–50.0)
Hemoglobin: 14.8 g/dL (ref 13.2–17.1)
Lymphs Abs: 1326 cells/uL (ref 850–3900)
MCH: 30 pg (ref 27.0–33.0)
MCHC: 33.6 g/dL (ref 32.0–36.0)
MCV: 89.5 fL (ref 80.0–100.0)
MPV: 10.2 fL (ref 7.5–12.5)
Monocytes Relative: 13.5 %
Neutro Abs: 2346 cells/uL (ref 1500–7800)
Neutrophils Relative %: 46 %
Platelets: 219 10*3/uL (ref 140–400)
RBC: 4.93 10*6/uL (ref 4.20–5.80)
RDW: 12.8 % (ref 11.0–15.0)
Total Lymphocyte: 26 %
WBC: 5.1 10*3/uL (ref 3.8–10.8)

## 2018-06-21 LAB — LIPID PANEL
Cholesterol: 131 mg/dL (ref <200)
HDL: 40 mg/dL (ref >=40)
LDL Cholesterol (Calc): 76 mg/dL (calc)
Non-HDL Cholesterol (Calc): 91 mg/dL (calc) (ref <130)
Total CHOL/HDL Ratio: 3.3 (calc) (ref <5.0)
Triglycerides: 70 mg/dL (ref <150)

## 2018-06-21 LAB — PSA: PSA: 0.3 ng/mL (ref <=4.0)

## 2018-06-21 MED ORDER — TAMSULOSIN HCL 0.4 MG PO CAPS: 0.4000 mg | ORAL_CAPSULE | Freq: Every day | ORAL | 3 refills | Status: DC

## 2018-06-21 MED ORDER — OXYBUTYNIN CHLORIDE 5 MG PO TABS: 5.0000 mg | ORAL_TABLET | Freq: Two times a day (BID) | ORAL | 3 refills | Status: DC

## 2018-06-21 NOTE — Assessment & Plan Note (Signed)
Discussed health risks of morbid obesity, advised calorie reduction and increased physical activity as able

## 2018-06-21 NOTE — Progress Notes (Signed)
Subjective:   Keith Watkins is a 73 y.o. male who presents for Medicare Annual/Subsequent preventive examination.  Review of Systems:   Review of Systems  Constitutional: Negative.  Negative for activity change, appetite change, fatigue and unexpected weight change.  HENT: Negative.   Eyes: Negative.  Negative for visual disturbance.  Respiratory: Negative.  Negative for apnea, chest tightness and shortness of breath.   Cardiovascular: Positive for leg swelling. Negative for chest pain and palpitations.  Gastrointestinal: Negative.  Negative for abdominal pain and blood in stool.  Endocrine: Negative.  Negative for cold intolerance, heat intolerance, polydipsia, polyphagia and polyuria.  Genitourinary: Positive for urgency. Negative for decreased urine volume, difficulty urinating, dysuria, frequency, hematuria and testicular pain.  Musculoskeletal: Positive for arthralgias. Negative for gait problem and myalgias.  Skin: Negative.  Negative for color change, pallor, rash and wound.  Allergic/Immunologic: Negative.   Neurological: Negative.  Negative for dizziness, syncope, weakness, light-headedness, numbness and headaches.  Hematological: Negative.  Negative for adenopathy. Does not bruise/bleed easily.  Psychiatric/Behavioral: Negative.  Negative for confusion, dysphoric mood, self-injury, sleep disturbance and suicidal ideas. The patient is not nervous/anxious.   All other systems reviewed and are negative.  Cardiac Risk Factors include: advanced age (>69men, >26 women);hypertension;male gender;obesity (BMI >30kg/m2);sedentary lifestyle     Objective:    Vitals: BP 124/66   Pulse 80   Temp 98.4 F (36.9 C)   Resp 18   Ht 5\' 11"  (1.803 m)   Wt (!) 338 lb 12.8 oz (153.7 kg)   SpO2 96%   BMI 47.25 kg/m   Body mass index is 47.25 kg/m.  Physical Exam Vitals signs and nursing note reviewed.  Constitutional:      General: He is not in acute distress.    Appearance: He is  well-developed. He is obese. He is not ill-appearing, toxic-appearing or diaphoretic.     Comments: Morbidly obese, well appearing pleasant white male  HENT:     Head: Normocephalic and atraumatic.     Right Ear: External ear normal.     Left Ear: External ear normal.     Nose: Nose normal. No congestion.     Mouth/Throat:     Mouth: Mucous membranes are moist.     Pharynx: Oropharynx is clear. No oropharyngeal exudate or posterior oropharyngeal erythema.  Eyes:     General: No scleral icterus.       Right eye: No discharge.        Left eye: No discharge.     Conjunctiva/sclera: Conjunctivae normal.     Pupils: Pupils are equal, round, and reactive to light.  Neck:     Trachea: No tracheal deviation.  Cardiovascular:     Rate and Rhythm: Normal rate and regular rhythm.     Chest Wall: PMI is not displaced.     Pulses: No decreased pulses.          Radial pulses are 2+ on the right side and 2+ on the left side.     Heart sounds: Normal heart sounds. No murmur. No friction rub. No gallop.      Comments: B/l pretibial pitting edema 2+ Pulmonary:     Effort: Pulmonary effort is normal. No respiratory distress.     Breath sounds: Normal breath sounds. No stridor. No wheezing, rhonchi or rales.  Musculoskeletal: Normal range of motion.     Right lower leg: Edema present.     Left lower leg: Edema present.  Skin:    General:  Skin is warm and dry.     Capillary Refill: Capillary refill takes less than 2 seconds.     Findings: No rash.  Neurological:     Mental Status: He is alert and oriented to person, place, and time.     Motor: No abnormal muscle tone.     Coordination: Coordination normal.     Gait: Gait abnormal (mildly antalgic gait).  Psychiatric:        Mood and Affect: Mood normal.        Behavior: Behavior normal.        Thought Content: Thought content normal.        Judgment: Judgment normal.      Advanced Directives 06/21/2018 06/11/2015 06/20/2014 03/18/2014  02/25/2014 08/07/2011 08/07/2011  Does Patient Have a Medical Advance Directive? Yes Yes Yes Yes Yes Patient has advance directive, copy not in chart Patient does not have advance directive  Type of Advance Directive [place];Living will [place];Living will - [place];Living will Healthcare Power of [place];Living will -  Copy of [place] in Chart? No - copy requested No - copy requested - No - copy requested - Copy requested from family -  Pre-existing out of facility DNR order (yellow form or pink MOST form) - - - - - Yes, notify physician for inpatient order -    Tobacco Social History   Tobacco Use  Smoking Status Never Smoker  Smokeless Tobacco Never Used     Counseling given: N/A   Clinical Intake:  Pre-visit preparation completed: No  Pain : No/denies pain     BMI - recorded: 47.25 Nutritional Status: BMI > 30  Obese Nutritional Risks: None Diabetes: No  How often do you need to have someone help you when you read instructions, pamphlets, or other written materials from your doctor or pharmacy?: 1 - Never  Interpreter Needed?: No  Information entered by :: AH and LT  Past Medical History:  Diagnosis Date  . Arthritis   . Back pain   . Cataract   . Cellulitis 07/31/2011  . Enlarged prostate   . Foot abscess, left 08/07/2011  . Hypertension    prior to gastric bypass- no meds taken  . Lumbar spinal stenosis   . Obesity 08/09/2011   Past Surgical History:  Procedure Laterality Date  . APPENDECTOMY    . arthroscopic knee surgery    . CHOLECYSTECTOMY    . I&D EXTREMITY  08/07/2011   Procedure: IRRIGATION AND DEBRIDEMENT EXTREMITY;  Surgeon: Wylene Simmer, MD;  Location: [facility];  Service: Orthopedics;  Laterality: Left;  irrigation and debridement left foot abscess  . stomach stapled     Family History  Problem Relation Age of Onset  . Heart disease Mother   .  Cancer Sister        Breast  . Breast cancer Sister   . Hypertension Brother   . Hypertension Maternal Grandmother   . Stroke Maternal Grandmother   . Heart disease Maternal Grandfather   . Hypertension Maternal Grandfather   . Diabetes Sister   . Heart disease Sister   . Hyperlipidemia Sister   . Hypertension Sister   . Stroke Sister   . Diabetes Other   . Stroke Other   . Hypertension Other   . Coronary artery disease Other   . Heart failure Other   . Colon cancer Neg Hx   . Esophageal cancer Neg Hx   .  Stomach cancer Neg Hx   . Rectal cancer Neg Hx    Social History   Socioeconomic History  . Marital status: Married    Spouse name: Not on file  . Number of children: Not on file  . Years of education: Not on file  . Highest education level: Not on file  Occupational History  . Not on file  Social Needs  . Financial resource strain: Not on file  . Food insecurity    Worry: Not on file    Inability: Not on file  . Transportation needs    Medical: Not on file    Non-medical: Not on file  Tobacco Use  . Smoking status: Never Smoker  . Smokeless tobacco: Never Used  Substance and Sexual Activity  . Alcohol use: No  . Drug use: No  . Sexual activity: Not on file  Lifestyle  . Physical activity    Days per week: Not on file    Minutes per session: Not on file  . Stress: Not on file  Relationships  . Social Herbalist on phone: Not on file    Gets together: Not on file    Attends religious service: Not on file    Active member of club or organization: Not on file    Attends meetings of clubs or organizations: Not on file    Relationship status: Not on file  Other Topics Concern  . Not on file  Social History Narrative  . Not on file    Outpatient Encounter Medications as of 06/21/2018  Medication Sig  . oxybutynin (DITROPAN) 5 MG tablet Take 1 tablet (5 mg total) by mouth 2 (two) times daily.  . tamsulosin (FLOMAX) 0.4 MG CAPS capsule Take 1  capsule (0.4 mg total) by mouth at bedtime.  . [DISCONTINUED] oxybutynin (DITROPAN) 5 MG tablet Take 1 tablet (5 mg total) by mouth 2 (two) times daily.  . [DISCONTINUED] tamsulosin (FLOMAX) 0.4 MG CAPS capsule Take 1 capsule (0.4 mg total) by mouth at bedtime.  . [DISCONTINUED] doxycycline (VIBRA-TABS) 100 MG tablet Take 1 tablet (100 mg total) by mouth 2 (two) times daily. (Patient not taking: Reported on 06/21/2018)   No facility-administered encounter medications on file as of 06/21/2018.     Activities of Daily Living In your present state of health, do you have any difficulty performing the following activities: 06/21/2018  Hearing? Y  Vision? Y  Difficulty concentrating or making decisions? N  Walking or climbing stairs? N  Dressing or bathing? N  Doing errands, shopping? N  Preparing Food and eating ? N  Using the Toilet? N  In the past six months, have you accidently leaked urine? Y  Do you have problems with loss of bowel control? Y  Managing your Medications? N  Managing your Finances? N  Housekeeping or managing your Housekeeping? N  Some recent data might be hidden    Patient Care Team: Susy Frizzle, MD as PCP - General (Family Medicine)   Assessment:   This is a routine wellness examination for Hezzie.  Exercise Activities and Dietary recommendations Current Exercise Habits: The patient does not participate in regular exercise at present, Exercise limited by: orthopedic condition(s)  Goals   Weight loss, calorie reduction, healthier diet while traveling extensively for work     Fall Risk Fall Risk  06/21/2018 06/21/2018 06/21/2018 06/21/2018 04/21/2017  Falls in the past year? - 0 0 0 No  Number falls in past yr: - 0 - - -  Injury with Fall? - 0 - - -  Risk for fall due to : Impaired balance/gait;Impaired mobility - - - -  Follow up Falls evaluation completed;Education provided;Falls prevention discussed Falls evaluation completed;Falls prevention discussed - - -    Is the patient's home free of loose throw rugs in walkways, pet beds, electrical cords, etc?   no      Grab bars in the bathroom? yes      Handrails on the stairs?   yes      Adequate lighting?   yes  Timed Get Up and Go Performed: < 12s  Depression Screen PHQ 2/9 Scores 04/21/2017 04/15/2016  PHQ - 2 Score 0 0     Office Visit from 06/21/2018 in [facility]  AUDIT-C Score  0       Cognitive Testing   Alert? Yes  Normal Appearance?Yes  Oriented to person? Yes  Place? Yes  Time? Yes  Recall of three objects? Yes  Can perform simple calculations? Yes  Displays appropriate judgment?  Yes  Can read the correct time from a watch face?Yes          Immunization History  Administered Date(s) Administered  . Influenza,inj,Quad PF,6+ Mos 02/06/2013, 02/14/2014  . Influenza-Unspecified 01/20/2017  . Pneumococcal Conjugate-13 02/06/2013  . Pneumococcal Polysaccharide-23 08/09/2011    Qualifies for Shingles Vaccine? Done in the past per pt report today  Screening Tests Health Maintenance  Topic Date Due  . INFLUENZA VACCINE  08/05/2018  . TETANUS/TDAP  01/04/2022  . COLONOSCOPY  03/07/2024  . Hepatitis C Screening  Completed  . PNA vac Low Risk Adult  Completed   Cancer Screenings: Lung: Low Dose CT Chest recommended if Age 59-80 years, 30 pack-year currently smoking OR have quit w/in 15years. Patient does not qualify. Colorectal: UTD  Additional Screenings:  Hepatitis C Screening:  Done   Results for orders placed or performed in visit on 06/20/18  CBC with Differential/Platelet  Result Value Ref Range   WBC 5.1 3.8 - 10.8 Thousand/uL   RBC 4.93 4.20 - 5.80 Million/uL   Hemoglobin 14.8 13.2 - 17.1 g/dL   HCT 44.1 38.5 - 50.0 %   MCV 89.5 80.0 - 100.0 fL   MCH 30.0 27.0 - 33.0 pg   MCHC 33.6 32.0 - 36.0 g/dL   RDW 12.8 11.0 - 15.0 %   Platelets 219 140 - 400 Thousand/uL   MPV 10.2 7.5 - 12.5 fL   Neutro Abs 2,346 1,500 - 7,800 cells/uL    Lymphs Abs 1,326 850 - 3,900 cells/uL   Absolute Monocytes 689 200 - 950 cells/uL   Eosinophils Absolute 648 (H) 15 - 500 cells/uL   Basophils Absolute 92 0 - 200 cells/uL   Neutrophils Relative % 46 %   Total Lymphocyte 26.0 %   Monocytes Relative 13.5 %   Eosinophils Relative 12.7 %   Basophils Relative 1.8 %  COMPLETE METABOLIC PANEL WITH GFR  Result Value Ref Range   Glucose, Bld 99 65 - 99 mg/dL   BUN 16 7 - 25 mg/dL   Creat 0.88 0.70 - 1.18 mg/dL   GFR, Est Non African American 86 > OR = 60 mL/min/1.52m2   GFR, Est African American 99 > OR = 60 mL/min/1.55m2   BUN/Creatinine Ratio NOT APPLICABLE 6 - 22 (calc)   Sodium 141 135 - 146 mmol/L   Potassium 4.4 3.5 - 5.3 mmol/L   Chloride 104 98 - 110 mmol/L   CO2 27 20 -  32 mmol/L   Calcium 8.6 8.6 - 10.3 mg/dL   Total Protein 6.1 6.1 - 8.1 g/dL   Albumin 3.3 (L) 3.6 - 5.1 g/dL   Globulin 2.8 1.9 - 3.7 g/dL (calc)   AG Ratio 1.2 1.0 - 2.5 (calc)   Total Bilirubin 0.9 0.2 - 1.2 mg/dL   Alkaline phosphatase (APISO) 73 35 - 144 U/L   AST 15 10 - 35 U/L   ALT 8 (L) 9 - 46 U/L  Lipid panel  Result Value Ref Range   Cholesterol 131 <200 mg/dL   HDL 40 > OR = 40 mg/dL   Triglycerides 70 <150 mg/dL   LDL Cholesterol (Calc) 76 mg/dL (calc)   Total CHOL/HDL Ratio 3.3 <5.0 (calc)   Non-HDL Cholesterol (Calc) 91 <130 mg/dL (calc)  PSA  Result Value Ref Range   PSA 0.3 < OR = 4.0 ng/mL   Labs reviewed with pt today      Plan:   Pt is 73 y/o white male, pt of Dr. Samella Parr, medically simple with hx of BPH and morbid obesity, labs were done yesterday and reviewed with him today, all labs look great, including PSA low and similar to last labs.  Pt has urinary incontinence occasionally and incontinence of stool which he attributes to poor diet filled with "junk" and fast food.    Also has b/l pitting edema, likely due to vascular insufficiency - worse with stasis, improved with elevation, activity or when he wears compression  stockings during travel.    Mild fall risk with morbid obesity and left knee pain secondary to OA.  He is UTD on all screenings and vaccines except due for flu shot later this year.  Advised diet and exercise as tolerated to obtain healthier weight.  Problem List Items Addressed This Visit      Cardiovascular and Mediastinum   Hypertension    Currently at goal States BP improved after weightloss surgery, currently no exercise and diet in place, but BP remains optimal        Genitourinary   Benign prostatic hyperplasia (BPH) with urinary urge incontinence    Well controlled with meds, no SE from medications occasional urge incontinence, no daily LUTS PSA was low and no change from last years labs done by PCP Did discuss with pt limitations of PSA Meds refilled      Relevant Medications   oxybutynin (DITROPAN) 5 MG tablet   tamsulosin (FLOMAX) 0.4 MG CAPS capsule     Other   Morbid obesity with BMI of 45.0-49.9, adult (HCC)    Discussed health risks of morbid obesity, advised calorie reduction and increased physical activity as able      Bilateral lower extremity edema    Other Visit Diagnoses    Routine general medical examination at a health care facility    -  Primary   Benign prostatic hyperplasia with urinary frequency       Encounter for Medicare annual wellness exam          I have personally reviewed and noted the following in the patient's chart:   . Medical and social history . Use of alcohol, tobacco or illicit drugs  . Current medications and supplements . Functional ability and status . Nutritional status . Physical activity . Advanced directives . List of other physicians . Hospitalizations, surgeries, and ER visits in previous 12 months . Vitals . Screenings to include cognitive, depression, and falls . Referrals and appointments  In addition, I have reviewed  and discussed with patient certain preventive protocols, quality metrics, and best  practice recommendations. A written personalized care plan for preventive services as well as general preventive health recommendations were provided to patient.     Delsa Grana, PA-C  06/21/2018

## 2018-06-21 NOTE — Assessment & Plan Note (Signed)
Well controlled with meds, no SE from medications occasional urge incontinence, no daily LUTS PSA was low and no change from last years labs done by PCP Did discuss with pt limitations of PSA Meds refilled

## 2018-06-21 NOTE — Assessment & Plan Note (Signed)
Currently at goal States BP improved after weightloss surgery, currently no exercise and diet in place, but BP remains optimal

## 2018-06-21 NOTE — Patient Instructions (Addendum)
  Keith Watkins , Thank you for taking time to come for your Medicare Wellness Visit. I appreciate your ongoing commitment to your health goals. Please review the following plan we discussed and let me know if I can assist you in the future.   These are the goals we discussed: Goals   Weight loss with calorie reduction and increased physical activity as tolerated     This is a list of the screening recommended for you and due dates:  Health Maintenance  Topic Date Due  . Flu Shot  08/05/2018  . Tetanus Vaccine  01/04/2022  . Colon Cancer Screening  03/07/2024  .  Hepatitis C: One time screening is recommended by Center for Disease Control  (CDC) for  adults born from [birth year] through 1965.   Completed  . Pneumonia vaccines  Completed

## 2018-07-24 DIAGNOSIS — M5442 Lumbago with sciatica, left side: Secondary | ICD-10-CM | POA: Diagnosis not present

## 2018-07-24 DIAGNOSIS — M545 Low back pain: Secondary | ICD-10-CM | POA: Diagnosis not present

## 2018-08-07 DIAGNOSIS — M5416 Radiculopathy, lumbar region: Secondary | ICD-10-CM | POA: Diagnosis not present

## 2018-08-11 DIAGNOSIS — M5416 Radiculopathy, lumbar region: Secondary | ICD-10-CM | POA: Diagnosis not present

## 2018-08-15 DIAGNOSIS — M5416 Radiculopathy, lumbar region: Secondary | ICD-10-CM | POA: Diagnosis not present

## 2018-08-17 ENCOUNTER — Other Ambulatory Visit: Payer: Self-pay | Admitting: Family Medicine

## 2018-08-17 DIAGNOSIS — M545 Low back pain, unspecified: Secondary | ICD-10-CM

## 2018-08-17 DIAGNOSIS — M5416 Radiculopathy, lumbar region: Secondary | ICD-10-CM | POA: Diagnosis not present

## 2018-08-19 ENCOUNTER — Ambulatory Visit
Admission: RE | Admit: 2018-08-19 | Discharge: 2018-08-19 | Disposition: A | Discharge status: Home / Self Care | Payer: Medicare HMO | Source: Ambulatory Visit | Attending: Family Medicine | Admitting: Family Medicine

## 2018-08-19 ENCOUNTER — Other Ambulatory Visit: Payer: Self-pay

## 2018-08-19 DIAGNOSIS — M545 Low back pain, unspecified: Secondary | ICD-10-CM

## 2018-08-21 DIAGNOSIS — M5416 Radiculopathy, lumbar region: Secondary | ICD-10-CM | POA: Diagnosis not present

## 2018-08-22 DIAGNOSIS — M5416 Radiculopathy, lumbar region: Secondary | ICD-10-CM | POA: Diagnosis not present

## 2018-08-24 DIAGNOSIS — M5136 Other intervertebral disc degeneration, lumbar region: Secondary | ICD-10-CM | POA: Diagnosis not present

## 2018-08-24 DIAGNOSIS — M48061 Spinal stenosis, lumbar region without neurogenic claudication: Secondary | ICD-10-CM | POA: Diagnosis not present

## 2018-08-28 DIAGNOSIS — M5136 Other intervertebral disc degeneration, lumbar region: Secondary | ICD-10-CM | POA: Diagnosis not present

## 2018-08-28 DIAGNOSIS — M48061 Spinal stenosis, lumbar region without neurogenic claudication: Secondary | ICD-10-CM | POA: Diagnosis not present

## 2018-08-31 DIAGNOSIS — M48061 Spinal stenosis, lumbar region without neurogenic claudication: Secondary | ICD-10-CM | POA: Diagnosis not present

## 2018-08-31 DIAGNOSIS — M5136 Other intervertebral disc degeneration, lumbar region: Secondary | ICD-10-CM | POA: Diagnosis not present

## 2018-09-04 DIAGNOSIS — M5136 Other intervertebral disc degeneration, lumbar region: Secondary | ICD-10-CM | POA: Diagnosis not present

## 2018-09-04 DIAGNOSIS — M48061 Spinal stenosis, lumbar region without neurogenic claudication: Secondary | ICD-10-CM | POA: Diagnosis not present

## 2018-09-07 DIAGNOSIS — M48061 Spinal stenosis, lumbar region without neurogenic claudication: Secondary | ICD-10-CM | POA: Diagnosis not present

## 2018-09-07 DIAGNOSIS — M5136 Other intervertebral disc degeneration, lumbar region: Secondary | ICD-10-CM | POA: Diagnosis not present

## 2018-09-12 DIAGNOSIS — M48061 Spinal stenosis, lumbar region without neurogenic claudication: Secondary | ICD-10-CM | POA: Diagnosis not present

## 2018-09-12 DIAGNOSIS — M5136 Other intervertebral disc degeneration, lumbar region: Secondary | ICD-10-CM | POA: Diagnosis not present

## 2018-09-14 DIAGNOSIS — M48061 Spinal stenosis, lumbar region without neurogenic claudication: Secondary | ICD-10-CM | POA: Diagnosis not present

## 2018-09-14 DIAGNOSIS — M5136 Other intervertebral disc degeneration, lumbar region: Secondary | ICD-10-CM | POA: Diagnosis not present

## 2018-09-18 DIAGNOSIS — M5136 Other intervertebral disc degeneration, lumbar region: Secondary | ICD-10-CM | POA: Diagnosis not present

## 2018-09-18 DIAGNOSIS — M48061 Spinal stenosis, lumbar region without neurogenic claudication: Secondary | ICD-10-CM | POA: Diagnosis not present

## 2018-09-21 DIAGNOSIS — M48061 Spinal stenosis, lumbar region without neurogenic claudication: Secondary | ICD-10-CM | POA: Diagnosis not present

## 2018-09-21 DIAGNOSIS — M5136 Other intervertebral disc degeneration, lumbar region: Secondary | ICD-10-CM | POA: Diagnosis not present

## 2018-09-25 DIAGNOSIS — M48061 Spinal stenosis, lumbar region without neurogenic claudication: Secondary | ICD-10-CM | POA: Diagnosis not present

## 2018-09-25 DIAGNOSIS — M5136 Other intervertebral disc degeneration, lumbar region: Secondary | ICD-10-CM | POA: Diagnosis not present

## 2018-09-27 DIAGNOSIS — R69 Illness, unspecified: Secondary | ICD-10-CM | POA: Diagnosis not present

## 2018-09-28 DIAGNOSIS — M48061 Spinal stenosis, lumbar region without neurogenic claudication: Secondary | ICD-10-CM | POA: Diagnosis not present

## 2018-09-28 DIAGNOSIS — M5136 Other intervertebral disc degeneration, lumbar region: Secondary | ICD-10-CM | POA: Diagnosis not present

## 2018-10-02 DIAGNOSIS — M48061 Spinal stenosis, lumbar region without neurogenic claudication: Secondary | ICD-10-CM | POA: Diagnosis not present

## 2018-10-02 DIAGNOSIS — M5136 Other intervertebral disc degeneration, lumbar region: Secondary | ICD-10-CM | POA: Diagnosis not present

## 2018-10-04 DIAGNOSIS — M5136 Other intervertebral disc degeneration, lumbar region: Secondary | ICD-10-CM | POA: Diagnosis not present

## 2018-10-04 DIAGNOSIS — M48061 Spinal stenosis, lumbar region without neurogenic claudication: Secondary | ICD-10-CM | POA: Diagnosis not present

## 2018-10-09 DIAGNOSIS — M48061 Spinal stenosis, lumbar region without neurogenic claudication: Secondary | ICD-10-CM | POA: Diagnosis not present

## 2018-11-15 ENCOUNTER — Telehealth: Payer: Self-pay | Admitting: Family Medicine

## 2018-11-15 NOTE — Telephone Encounter (Signed)
Pt called and states that the medications for his prostate seems to not be working anymore. He wanted to know if we needed to change medications or send him to Urology?   CB# [phone]

## 2018-11-16 NOTE — Telephone Encounter (Signed)
Call placed to patient.   Reports no pain with urinating, or difficulty starting urination. Reports that when he stands from sitting at desk at work, he has intense urgency and small amount of leakage.   Patient does state that he has begun taking Mobic for his back and is concerned that this could be affecting the medication he currently takes.   Advised to schedule OV to come in to discuss with PCP. Appointment scheduled.

## 2018-11-16 NOTE — Telephone Encounter (Signed)
I would recommend starting the patient on finasteride 5 mg a day.  This takes 3 months to start to work.  Therefore I am happy with him seeing urology for a second opinion however hopefully this will help.

## 2018-11-20 ENCOUNTER — Other Ambulatory Visit: Payer: Self-pay

## 2018-11-21 ENCOUNTER — Ambulatory Visit (INDEPENDENT_AMBULATORY_CARE_PROVIDER_SITE_OTHER): Payer: Medicare HMO | Admitting: Family Medicine

## 2018-11-21 ENCOUNTER — Encounter: Payer: Self-pay | Admitting: Family Medicine

## 2018-11-21 VITALS — BP 140/90 | HR 92 | Temp 97.1°F | Resp 20 | Ht 70.5 in | Wt 350.0 lb

## 2018-11-21 DIAGNOSIS — R3915 Urgency of urination: Secondary | ICD-10-CM | POA: Diagnosis not present

## 2018-11-21 LAB — URINALYSIS, ROUTINE W REFLEX MICROSCOPIC
Bacteria, UA: NONE SEEN /HPF
Bilirubin Urine: NEGATIVE
Glucose, UA: NEGATIVE
Hyaline Cast: NONE SEEN /LPF
Ketones, ur: NEGATIVE
Leukocytes,Ua: NEGATIVE
Nitrite: NEGATIVE
Protein, ur: NEGATIVE
Specific Gravity, Urine: 1.025 (ref 1.001–1.03)
WBC, UA: NONE SEEN /HPF (ref 0–5)
pH: 7 (ref 5.0–8.0)

## 2018-11-21 LAB — MICROSCOPIC MESSAGE

## 2018-11-21 MED ORDER — SOLIFENACIN SUCCINATE 10 MG PO TABS: 10.0000 mg | ORAL_TABLET | Freq: Every day | ORAL | 1 refills | Status: DC

## 2018-11-21 NOTE — Progress Notes (Signed)
Subjective:    Patient ID: Keith Watkins, male    DOB: September 14, 1945, 73 y.o.   MRN: [id number]  HPI Patient is a very pleasant 73 year old Caucasian male who presents with lower urinary tract symptoms.  He states that over the last several months he has a frequent urge to urinate.  Patient states that he can sit for hours however as soon as he stands up, he feels the sudden urge to urinate.  Often he is unable to make it to the restroom in time.  He will have urinary urge incontinence.  He denies any dysuria or hematuria.  He denies a weak stream.  He denies any hesitancy.  He denies any urinary retention.  At night he can lie down for 6 hours however as soon as he sits up on the side of the bed he will have the sudden urge to urinate and often has urinary incontinence.  He is currently taking Flomax along with oxybutynin 5 mg twice a day.  Urinalysis today shows trace blood but otherwise is completely normal Past Medical History:  Diagnosis Date  . Arthritis   . Back pain   . Cataract   . Cellulitis 07/31/2011  . Enlarged prostate   . Foot abscess, left 08/07/2011  . Hypertension    prior to gastric bypass- no meds taken  . Lumbar spinal stenosis   . Obesity 08/09/2011   Past Surgical History:  Procedure Laterality Date  . APPENDECTOMY    . arthroscopic knee surgery    . CHOLECYSTECTOMY    . I&D EXTREMITY  08/07/2011   Procedure: IRRIGATION AND DEBRIDEMENT EXTREMITY;  Surgeon: Wylene Simmer, MD;  Location: [facility];  Service: Orthopedics;  Laterality: Left;  irrigation and debridement left foot abscess  . stomach stapled     Current Outpatient Medications on File Prior to Visit  Medication Sig Dispense Refill  . meloxicam (MOBIC) 15 MG tablet Take 15 mg by mouth daily.    Marland Kitchen oxybutynin (DITROPAN) 5 MG tablet Take 1 tablet (5 mg total) by mouth 2 (two) times daily. 180 tablet 3  . tamsulosin (FLOMAX) 0.4 MG CAPS capsule Take 1 capsule (0.4 mg total) by mouth at bedtime. 90 capsule 3   No current  facility-administered medications on file prior to visit.    No Known Allergies Social History   Socioeconomic History  . Marital status: Married    Spouse name: Not on file  . Number of children: Not on file  . Years of education: Not on file  . Highest education level: Not on file  Occupational History  . Not on file  Social Needs  . Financial resource strain: Not on file  . Food insecurity    Worry: Not on file    Inability: Not on file  . Transportation needs    Medical: Not on file    Non-medical: Not on file  Tobacco Use  . Smoking status: Never Smoker  . Smokeless tobacco: Never Used  Substance and Sexual Activity  . Alcohol use: No  . Drug use: No  . Sexual activity: Not on file  Lifestyle  . Physical activity    Days per week: Not on file    Minutes per session: Not on file  . Stress: Not on file  Relationships  . Social Herbalist on phone: Not on file    Gets together: Not on file    Attends religious service: Not on file    Active member  of club or organization: Not on file    Attends meetings of clubs or organizations: Not on file    Relationship status: Not on file  . Intimate partner violence    Fear of current or ex partner: Not on file    Emotionally abused: Not on file    Physically abused: Not on file    Forced sexual activity: Not on file  Other Topics Concern  . Not on file  Social History Narrative  . Not on file      Review of Systems  All other systems reviewed and are negative.      Objective:   Physical Exam Vitals signs reviewed.  Constitutional:      Appearance: He is obese.  Cardiovascular:     Rate and Rhythm: Normal rate and regular rhythm.     Heart sounds: Normal heart sounds.  Pulmonary:     Effort: Pulmonary effort is normal. No respiratory distress.     Breath sounds: Normal breath sounds. No wheezing.  Abdominal:     General: Bowel sounds are normal. There is no distension.     Tenderness: There is  no abdominal tenderness. There is no guarding.  Neurological:     Mental Status: He is alert.           Assessment & Plan:  Urgency of urination - Plan: Urinalysis, Routine w reflex microscopic, BASIC METABOLIC PANEL WITH GFR, PSA, CANCELED: In House Urinalysis  I believe the patient is likely having lower urinary tract symptoms related more to his overactive bladder rather than to BPH.  Recommend he continue the Flomax however we will discontinue oxybutynin and switch to Vesicare 10 mg a day.  If this is ineffective we will add Myrbetriq 25 mg a day.  Check PSA to rule out any evidence of prostate cancer.  Urinalysis is relatively normal.  Check BMP to evaluate any evidence of hypoglycemia as a potential cause.  Reassess in 2 to 3 weeks

## 2018-11-22 LAB — BASIC METABOLIC PANEL WITH GFR
BUN: 19 mg/dL (ref 7–25)
CO2: 28 mmol/L (ref 20–32)
Calcium: 8.7 mg/dL (ref 8.6–10.3)
Chloride: 105 mmol/L (ref 98–110)
Creat: 0.92 mg/dL (ref 0.70–1.18)
GFR, Est African American: 95 mL/min/{1.73_m2} (ref >=60)
GFR, Est Non African American: 82 mL/min/{1.73_m2} (ref >=60)
Glucose, Bld: 95 mg/dL (ref 65–99)
Potassium: 4.5 mmol/L (ref 3.5–5.3)
Sodium: 140 mmol/L (ref 135–146)

## 2018-11-22 LAB — PSA: PSA: 0.3 ng/mL (ref <=4.0)

## 2018-12-13 ENCOUNTER — Other Ambulatory Visit: Payer: Self-pay | Admitting: Family Medicine

## 2019-03-19 ENCOUNTER — Telehealth: Payer: Self-pay | Admitting: Family Medicine

## 2019-03-19 NOTE — Telephone Encounter (Signed)
Pt called and states that he was on vesicare for about 4 months and he has not seen any difference between Vesicare and oxybutynin. He wanted to know if you wanted to change him or go back to the oxybutynin?

## 2019-03-19 NOTE — Telephone Encounter (Signed)
He can take oxybutynin 5 mg potid and d/c vesicare.

## 2019-03-20 NOTE — Telephone Encounter (Signed)
Pt aware and does not need a rx right now.

## 2019-03-20 NOTE — Telephone Encounter (Signed)
Pt will also call insurance to check on price of myrbetriq and call back if they do cover it reasonably.

## 2019-04-05 ENCOUNTER — Other Ambulatory Visit: Payer: Self-pay | Admitting: Family Medicine

## 2019-04-18 ENCOUNTER — Other Ambulatory Visit: Payer: Self-pay | Admitting: Family Medicine

## 2019-04-18 DIAGNOSIS — N3941 Urge incontinence: Secondary | ICD-10-CM

## 2019-04-18 DIAGNOSIS — N401 Enlarged prostate with lower urinary tract symptoms: Secondary | ICD-10-CM

## 2019-06-25 ENCOUNTER — Encounter: Payer: Medicare HMO | Admitting: Family Medicine

## 2019-07-12 ENCOUNTER — Other Ambulatory Visit: Payer: Self-pay | Admitting: Family Medicine

## 2019-07-12 DIAGNOSIS — N3941 Urge incontinence: Secondary | ICD-10-CM

## 2019-07-12 DIAGNOSIS — N401 Enlarged prostate with lower urinary tract symptoms: Secondary | ICD-10-CM

## 2019-07-21 ENCOUNTER — Other Ambulatory Visit: Payer: Self-pay | Admitting: Family Medicine

## 2019-07-21 DIAGNOSIS — N3941 Urge incontinence: Secondary | ICD-10-CM

## 2019-07-21 DIAGNOSIS — N401 Enlarged prostate with lower urinary tract symptoms: Secondary | ICD-10-CM

## 2019-07-23 ENCOUNTER — Other Ambulatory Visit: Payer: Self-pay

## 2019-07-23 ENCOUNTER — Telehealth: Payer: Self-pay

## 2019-07-23 NOTE — Telephone Encounter (Signed)
Pt wants to continue taking tamsulosin, PT does have appt on 07/27/19 In pt chart he requested removal of this medication.

## 2019-07-23 NOTE — Telephone Encounter (Signed)
Pt wants to continue taking tamsulosin,

## 2019-07-24 ENCOUNTER — Other Ambulatory Visit: Payer: Medicare HMO

## 2019-07-24 ENCOUNTER — Other Ambulatory Visit: Payer: Self-pay

## 2019-07-24 DIAGNOSIS — Z125 Encounter for screening for malignant neoplasm of prostate: Secondary | ICD-10-CM | POA: Diagnosis not present

## 2019-07-24 DIAGNOSIS — Z0000 Encounter for general adult medical examination without abnormal findings: Secondary | ICD-10-CM

## 2019-07-24 DIAGNOSIS — Z13220 Encounter for screening for lipoid disorders: Secondary | ICD-10-CM

## 2019-07-25 LAB — COMPLETE METABOLIC PANEL WITH GFR
AG Ratio: 1.3 (calc) (ref 1.0–2.5)
ALT: 10 U/L (ref 9–46)
AST: 15 U/L (ref 10–35)
Albumin: 3.3 g/dL — ABNORMAL LOW (ref 3.6–5.1)
Alkaline phosphatase (APISO): 73 U/L (ref 35–144)
BUN: 16 mg/dL (ref 7–25)
CO2: 27 mmol/L (ref 20–32)
Calcium: 8.6 mg/dL (ref 8.6–10.3)
Chloride: 102 mmol/L (ref 98–110)
Creat: 0.96 mg/dL (ref 0.70–1.18)
GFR, Est African American: 91 mL/min/{1.73_m2} (ref >=60)
GFR, Est Non African American: 78 mL/min/{1.73_m2} (ref >=60)
Globulin: 2.6 g/dL (calc) (ref 1.9–3.7)
Glucose, Bld: 106 mg/dL — ABNORMAL HIGH (ref 65–99)
Potassium: 4 mmol/L (ref 3.5–5.3)
Sodium: 138 mmol/L (ref 135–146)
Total Bilirubin: 0.7 mg/dL (ref 0.2–1.2)
Total Protein: 5.9 g/dL — ABNORMAL LOW (ref 6.1–8.1)

## 2019-07-25 LAB — CBC WITH DIFFERENTIAL/PLATELET
Absolute Monocytes: 593 cells/uL (ref 200–950)
Basophils Absolute: 78 cells/uL (ref 0–200)
Basophils Relative: 1.5 %
Eosinophils Absolute: 1056 cells/uL — ABNORMAL HIGH (ref 15–500)
Eosinophils Relative: 20.3 %
HCT: 46.5 % (ref 38.5–50.0)
Hemoglobin: 15.3 g/dL (ref 13.2–17.1)
Lymphs Abs: 1357 cells/uL (ref 850–3900)
MCH: 30.1 pg (ref 27.0–33.0)
MCHC: 32.9 g/dL (ref 32.0–36.0)
MCV: 91.5 fL (ref 80.0–100.0)
MPV: 10.1 fL (ref 7.5–12.5)
Monocytes Relative: 11.4 %
Neutro Abs: 2116 cells/uL (ref 1500–7800)
Neutrophils Relative %: 40.7 %
Platelets: 214 10*3/uL (ref 140–400)
RBC: 5.08 10*6/uL (ref 4.20–5.80)
RDW: 12.7 % (ref 11.0–15.0)
Total Lymphocyte: 26.1 %
WBC: 5.2 10*3/uL (ref 3.8–10.8)

## 2019-07-25 LAB — LIPID PANEL
Cholesterol: 121 mg/dL (ref <200)
HDL: 33 mg/dL — ABNORMAL LOW (ref >=40)
LDL Cholesterol (Calc): 71 mg/dL (calc)
Non-HDL Cholesterol (Calc): 88 mg/dL (calc) (ref <130)
Total CHOL/HDL Ratio: 3.7 (calc) (ref <5.0)
Triglycerides: 90 mg/dL (ref <150)

## 2019-07-25 LAB — PSA: PSA: 0.3 ng/mL (ref <=4.0)

## 2019-07-27 ENCOUNTER — Ambulatory Visit (INDEPENDENT_AMBULATORY_CARE_PROVIDER_SITE_OTHER): Payer: Medicare HMO | Admitting: Family Medicine

## 2019-07-27 ENCOUNTER — Other Ambulatory Visit: Payer: Self-pay

## 2019-07-27 VITALS — BP 128/80 | HR 80 | Temp 97.2°F | Ht 70.0 in | Wt 350.0 lb

## 2019-07-27 DIAGNOSIS — Z6841 Body Mass Index (BMI) 40.0 and over, adult: Secondary | ICD-10-CM | POA: Diagnosis not present

## 2019-07-27 DIAGNOSIS — M48061 Spinal stenosis, lumbar region without neurogenic claudication: Secondary | ICD-10-CM | POA: Diagnosis not present

## 2019-07-27 DIAGNOSIS — N5089 Other specified disorders of the male genital organs: Secondary | ICD-10-CM

## 2019-07-27 DIAGNOSIS — Z0001 Encounter for general adult medical examination with abnormal findings: Secondary | ICD-10-CM | POA: Diagnosis not present

## 2019-07-27 DIAGNOSIS — Z0000 Encounter for general adult medical examination without abnormal findings: Secondary | ICD-10-CM

## 2019-07-27 DIAGNOSIS — R600 Localized edema: Secondary | ICD-10-CM | POA: Diagnosis not present

## 2019-07-27 NOTE — Progress Notes (Signed)
Subjective:    Patient ID: Keith Watkins, male    DOB: 1945-08-10, 74 y.o.   MRN: [id number]  HPI  Patient is here today for complete physical exam. Colonoscopy was performed 5 years ago and was significant only for diverticulosis.  Patient's immunizations are up-to-date.  He has had Pneumovax 23, Prevnar 13.  He reports that he had a shingles vaccine at a local pharmacy.  He has had his COVID shot.  Patient has switch from oxybutynin to [facility] and feels that the medication is helping.  That with a combination of Flomax is allowing him to empty his bladder less often but more fully.  Therefore he is comfortable with his current regimen of Vesicare and Flomax.  His PSA was 0.3 on his most recent lab work showing no indication of prostate cancer.  His immunizations are up-to-date as indicated above and below.  He denies any falls.  He denies any depression.  He denies any memory loss.  Unfortunately he reports swelling in his left testicle.  He states that at times it will be the size of a golf ball.  At other times it can be the size of a tennis ball.  Today on exam it is normal in size.  There is no appreciable mass.  The swelling comes and goes raising the concern of either hernia versus hydrocele.  However the patient states that the pain and swelling is in his testicle when it occurs. Immunization History  Administered Date(s) Administered  . Fluad Quad(high Dose 65+) 09/27/2018  . Influenza,inj,Quad PF,6+ Mos 02/06/2013, 02/14/2014  . Influenza-Unspecified 01/20/2017  . PFIZER SARS-COV-2 Vaccination 02/17/2019, 03/14/2019  . Pneumococcal Conjugate-13 02/06/2013  . Pneumococcal Polysaccharide-23 08/09/2011    Lab on 07/24/2019  Component Date Value Ref Range Status  . PSA 07/24/2019 0.3  < OR = 4.0 ng/mL Final   Comment: The total PSA value from this assay system is  standardized against the WHO standard. The test  result will be approximately 20% lower when compared  to the  equimolar-standardized total PSA (Beckman  Coulter). Comparison of serial PSA results should be  interpreted with this fact in mind. . This test was performed using the Siemens  chemiluminescent method. Values obtained from  different assay methods cannot be used interchangeably. PSA levels, regardless of value, should not be interpreted as absolute evidence of the presence or absence of disease.   . Glucose, Bld 07/24/2019 106* 65 - 99 mg/dL Final   Comment: .            Fasting reference interval . For someone without known diabetes, a glucose value between 100 and 125 mg/dL is consistent with prediabetes and should be confirmed with a follow-up test. .   . BUN 07/24/2019 16  7 - 25 mg/dL Final  . Creat 07/24/2019 0.96  0.70 - 1.18 mg/dL Final   Comment: For patients >55 years of age, the reference limit for Creatinine is approximately 13% higher for people identified as African-American. .   . GFR, Est Non African American 07/24/2019 78  > OR = 60 mL/min/1.71m2 Final  . GFR, Est African American 07/24/2019 91  > OR = 60 mL/min/1.68m2 Final  . BUN/Creatinine Ratio 24/23/5361 NOT APPLICABLE  6 - 22 (calc) Final  . Sodium 07/24/2019 138  135 - 146 mmol/L Final  . Potassium 07/24/2019 4.0  3.5 - 5.3 mmol/L Final  . Chloride 07/24/2019 102  98 - 110 mmol/L Final  . CO2 07/24/2019 27  20 -  32 mmol/L Final  . Calcium 07/24/2019 8.6  8.6 - 10.3 mg/dL Final  . Total Protein 07/24/2019 5.9* 6.1 - 8.1 g/dL Final  . Albumin 07/24/2019 3.3* 3.6 - 5.1 g/dL Final  . Globulin 07/24/2019 2.6  1.9 - 3.7 g/dL (calc) Final  . AG Ratio 07/24/2019 1.3  1.0 - 2.5 (calc) Final  . Total Bilirubin 07/24/2019 0.7  0.2 - 1.2 mg/dL Final  . Alkaline phosphatase (APISO) 07/24/2019 73  35 - 144 U/L Final  . AST 07/24/2019 15  10 - 35 U/L Final  . ALT 07/24/2019 10  9 - 46 U/L Final  . WBC 07/24/2019 5.2  3.8 - 10.8 Thousand/uL Final  . RBC 07/24/2019 5.08  4.20 - 5.80 Million/uL Final  . Hemoglobin  07/24/2019 15.3  13.2 - 17.1 g/dL Final  . HCT 07/24/2019 46.5  38 - 50 % Final  . MCV 07/24/2019 91.5  80.0 - 100.0 fL Final  . MCH 07/24/2019 30.1  27.0 - 33.0 pg Final  . MCHC 07/24/2019 32.9  32.0 - 36.0 g/dL Final  . RDW 07/24/2019 12.7  11.0 - 15.0 % Final  . Platelets 07/24/2019 214  140 - 400 Thousand/uL Final  . MPV 07/24/2019 10.1  7.5 - 12.5 fL Final  . Neutro Abs 07/24/2019 2,116  1,500 - 7,800 cells/uL Final  . Lymphs Abs 07/24/2019 1,357  850 - 3,900 cells/uL Final  . Absolute Monocytes 07/24/2019 593  200 - 950 cells/uL Final  . Eosinophils Absolute 07/24/2019 1,056* 15 - 500 cells/uL Final  . Basophils Absolute 07/24/2019 78  0 - 200 cells/uL Final  . Neutrophils Relative % 07/24/2019 40.7  % Final  . Total Lymphocyte 07/24/2019 26.1  % Final  . Monocytes Relative 07/24/2019 11.4  % Final  . Eosinophils Relative 07/24/2019 20.3  % Final  . Basophils Relative 07/24/2019 1.5  % Final  . Cholesterol 07/24/2019 121  <200 mg/dL Final  . HDL 07/24/2019 33* > OR = 40 mg/dL Final  . Triglycerides 07/24/2019 90  <150 mg/dL Final  . LDL Cholesterol (Calc) 07/24/2019 71  mg/dL (calc) Final   Comment: Reference range: <100 . Desirable range <100 mg/dL for primary prevention;   <70 mg/dL for patients with CHD or diabetic patients  with > or = 2 CHD risk factors. Marland Kitchen LDL-C is now calculated using the Martin-Hopkins  calculation, which is a validated novel method providing  better accuracy than the Friedewald equation in the  estimation of LDL-C.  Cresenciano Genre et al. Annamaria Helling. 7829;562(13): 2061-2068  (http://education.[facility].com/faq/FAQ164)   . Total CHOL/HDL Ratio 07/24/2019 3.7  <5.0 (calc) Final  . Non-HDL Cholesterol (Calc) 07/24/2019 88  <130 mg/dL (calc) Final   Comment: For patients with diabetes plus 1 major ASCVD risk  factor, treating to a non-HDL-C goal of <100 mg/dL  (LDL-C of <70 mg/dL) is considered a therapeutic  option.    Past Medical History:    Diagnosis Date  . Arthritis   . Back pain   . Cataract   . Cellulitis 07/31/2011  . Enlarged prostate   . Foot abscess, left 08/07/2011  . Hypertension    prior to gastric bypass- no meds taken  . Lumbar spinal stenosis   . Obesity 08/09/2011   Past Surgical History:  Procedure Laterality Date  . APPENDECTOMY    . arthroscopic knee surgery    . CHOLECYSTECTOMY    . I & D EXTREMITY  08/07/2011   Procedure: IRRIGATION AND DEBRIDEMENT EXTREMITY;  Surgeon: Wylene Simmer,  MD;  Location: [facility];  Service: Orthopedics;  Laterality: Left;  irrigation and debridement left foot abscess  . stomach stapled     Current Outpatient Medications on File Prior to Visit  Medication Sig Dispense Refill  . solifenacin (VESICARE) 10 MG tablet TAKE 1 TABLET BY MOUTH EVERY DAY 90 tablet 3  . tamsulosin (FLOMAX) 0.4 MG CAPS capsule TAKE 1 CAPSULE BY MOUTH EVERYDAY AT BEDTIME 30 capsule 0  . meloxicam (MOBIC) 15 MG tablet Take 15 mg by mouth daily.     No current facility-administered medications on file prior to visit.   No Known Allergies Social History   Socioeconomic History  . Marital status: Married    Spouse name: Not on file  . Number of children: Not on file  . Years of education: Not on file  . Highest education level: Not on file  Occupational History  . Not on file  Tobacco Use  . Smoking status: Never Smoker  . Smokeless tobacco: Never Used  Substance and Sexual Activity  . Alcohol use: No  . Drug use: No  . Sexual activity: Not on file  Other Topics Concern  . Not on file  Social History Narrative  . Not on file   Social Determinants of Health   Financial Resource Strain:   . Difficulty of Paying Living Expenses:   Food Insecurity:   . Worried About Charity fundraiser in the Last Year:   . Arboriculturist in the Last Year:   Transportation Needs:   . Film/video editor (Medical):   Marland Kitchen Lack of Transportation (Non-Medical):   Physical Activity:   . Days of Exercise per  Week:   . Minutes of Exercise per Session:   Stress:   . Feeling of Stress :   Social Connections:   . Frequency of Communication with Friends and Family:   . Frequency of Social Gatherings with Friends and Family:   . Attends Religious Services:   . Active Member of Clubs or Organizations:   . Attends Archivist Meetings:   Marland Kitchen Marital Status:   Intimate Partner Violence:   . Fear of Current or Ex-Partner:   . Emotionally Abused:   Marland Kitchen Physically Abused:   . Sexually Abused:    Family History  Problem Relation Age of Onset  . Heart disease Mother   . Cancer Sister        Breast  . Breast cancer Sister   . Hypertension Brother   . Hypertension Maternal Grandmother   . Stroke Maternal Grandmother   . Heart disease Maternal Grandfather   . Hypertension Maternal Grandfather   . Diabetes Sister   . Heart disease Sister   . Hyperlipidemia Sister   . Hypertension Sister   . Stroke Sister   . Diabetes Other   . Stroke Other   . Hypertension Other   . Coronary artery disease Other   . Heart failure Other   . Colon cancer Neg Hx   . Esophageal cancer Neg Hx   . Stomach cancer Neg Hx   . Rectal cancer Neg Hx       Review of Systems  All other systems reviewed and are negative.      Objective:   Physical Exam Vitals reviewed.  Constitutional:      General: He is not in acute distress.    Appearance: He is well-developed. He is obese. He is not ill-appearing, toxic-appearing or diaphoretic.  HENT:  Head: Normocephalic and atraumatic.     Right Ear: Tympanic membrane, ear canal and external ear normal. There is no impacted cerumen.     Left Ear: Tympanic membrane, ear canal and external ear normal. There is no impacted cerumen.     Nose: Nose normal. No congestion or rhinorrhea.     Mouth/Throat:     Mouth: Mucous membranes are moist.     Pharynx: No oropharyngeal exudate or posterior oropharyngeal erythema.  Eyes:     General: No scleral icterus.        Right eye: No discharge.        Left eye: No discharge.     Conjunctiva/sclera: Conjunctivae normal.     Pupils: Pupils are equal, round, and reactive to light.  Neck:     Thyroid: No thyromegaly.     Vascular: No carotid bruit or JVD.     Trachea: No tracheal deviation.  Cardiovascular:     Rate and Rhythm: Normal rate and regular rhythm.     Heart sounds: Normal heart sounds. No murmur heard.  No friction rub. No gallop.   Pulmonary:     Effort: Pulmonary effort is normal. No respiratory distress.     Breath sounds: Normal breath sounds. No stridor. No wheezing, rhonchi or rales.  Chest:     Chest wall: No tenderness.  Abdominal:     General: Bowel sounds are normal. There is no distension.     Palpations: Abdomen is soft. There is no mass.     Tenderness: There is no abdominal tenderness. There is no guarding or rebound.  Musculoskeletal:        General: No tenderness. Normal range of motion.     Cervical back: Normal range of motion and neck supple. No rigidity.     Right lower leg: Edema present.     Left lower leg: Edema present.  Lymphadenopathy:     Cervical: No cervical adenopathy.  Skin:    Coloration: Skin is not jaundiced or pale.     Findings: No bruising, erythema, lesion or rash.  Neurological:     Mental Status: He is alert and oriented to person, place, and time.     Cranial Nerves: No cranial nerve deficit.     Motor: No abnormal muscle tone.     Coordination: Coordination normal.     Deep Tendon Reflexes: Reflexes are normal and symmetric.  Psychiatric:        Behavior: Behavior normal.        Thought Content: Thought content normal.        Judgment: Judgment normal.    Due to body habitus, it is difficult to assess for any mass in the scrotum       Assessment & Plan:  Scrotal swelling - Plan: US Scrotum  Routine general medical examination at a health care facility  Morbid obesity with BMI of 45.0-49.9, adult ([place])  Spinal stenosis of lumbar  region without neurogenic claudication  Bilateral lower extremity edema  Patient has been suffering with low back pain over the last year.  He states that whenever he stands for more than 20 minutes, he will develop severe pain in the right lower back that radiates into his right hip.  He also developed severe pain in his left knee whenever he stands for more than 20 minutes.  He frequently has to sit down to help the pain.  MRI in October confirmed severe lumbar spinal stenosis.  I explained to the patient today  that the biggest thing that would help his back pain and his knee pain would be dramatic weight loss.  If he could lose 50 to 100 pounds, I believe his back pain and knee pain would improve dramatically.  I believe the patient will be a good candidate for Saxenda as he is already had a gastric bypass surgery.  I encouraged him to check with his insurance coverage to see if this is covered.  There is no evidence of prostate cancer based on his PSA.  Colonoscopy is up-to-date.  Immunizations are up-to-date.  Lab work is significant only for a decreased HDL of 33 and a mildly elevated blood sugar of 106.  All of these would improve with exercise and weight loss.  Therefore I recommended water aerobics as a means to exercise without injuring his back or knee further, restricting calories to less than 1200 a day, trying Saxenda for appetite suppression, and dramatic weight loss to help address all of his medical issues.  I am concerned by his history of swelling in the left side of his scrotum.  I believe the patient likely either has a hernia that is difficult to diagnose due to body habitus or perhaps a hydrocele that episodically swells due to edema.  I would recommend an ultrasound of the scrotum to evaluate further.  He does have significant edema in both legs today and I recommended that he wear compression hose to help manage this.

## 2019-08-01 ENCOUNTER — Other Ambulatory Visit: Payer: Self-pay

## 2019-08-01 ENCOUNTER — Ambulatory Visit
Admission: RE | Admit: 2019-08-01 | Discharge: 2019-08-01 | Disposition: A | Discharge status: Home / Self Care | Payer: Medicare HMO | Source: Ambulatory Visit | Attending: Family Medicine | Admitting: Family Medicine

## 2019-08-01 DIAGNOSIS — N5089 Other specified disorders of the male genital organs: Secondary | ICD-10-CM

## 2019-08-01 DIAGNOSIS — N433 Hydrocele, unspecified: Secondary | ICD-10-CM | POA: Diagnosis not present

## 2019-08-01 DIAGNOSIS — N503 Cyst of epididymis: Secondary | ICD-10-CM | POA: Diagnosis not present

## 2019-08-03 ENCOUNTER — Other Ambulatory Visit: Payer: Self-pay

## 2019-08-03 DIAGNOSIS — N401 Enlarged prostate with lower urinary tract symptoms: Secondary | ICD-10-CM

## 2019-08-03 DIAGNOSIS — N3941 Urge incontinence: Secondary | ICD-10-CM

## 2019-08-03 MED ORDER — TAMSULOSIN HCL 0.4 MG PO CAPS: ORAL_CAPSULE | ORAL | 4 refills | Status: DC

## 2019-08-03 MED ORDER — SOLIFENACIN SUCCINATE 10 MG PO TABS: 10.0000 mg | ORAL_TABLET | Freq: Every day | ORAL | 3 refills | Status: DC

## 2019-10-11 DIAGNOSIS — R69 Illness, unspecified: Secondary | ICD-10-CM | POA: Diagnosis not present

## 2019-11-10 ENCOUNTER — Other Ambulatory Visit: Payer: Self-pay | Admitting: Family Medicine

## 2019-11-10 DIAGNOSIS — N401 Enlarged prostate with lower urinary tract symptoms: Secondary | ICD-10-CM

## 2019-11-10 DIAGNOSIS — N3941 Urge incontinence: Secondary | ICD-10-CM

## 2019-11-30 DIAGNOSIS — R69 Illness, unspecified: Secondary | ICD-10-CM | POA: Diagnosis not present

## 2019-12-27 DIAGNOSIS — H5203 Hypermetropia, bilateral: Secondary | ICD-10-CM | POA: Diagnosis not present

## 2020-01-15 ENCOUNTER — Ambulatory Visit (INDEPENDENT_AMBULATORY_CARE_PROVIDER_SITE_OTHER): Payer: Medicare HMO | Admitting: Family Medicine

## 2020-01-15 DIAGNOSIS — Z20822 Contact with and (suspected) exposure to covid-19: Secondary | ICD-10-CM | POA: Diagnosis not present

## 2020-01-18 ENCOUNTER — Other Ambulatory Visit: Payer: Self-pay | Admitting: *Deleted

## 2020-01-18 LAB — SARS-COV-2 RNA,(COVID-19) QUALITATIVE NAAT: SARS CoV2 RNA: DETECTED — AB

## 2020-02-15 ENCOUNTER — Other Ambulatory Visit: Payer: Self-pay

## 2020-02-15 DIAGNOSIS — Z20822 Contact with and (suspected) exposure to covid-19: Secondary | ICD-10-CM

## 2020-02-18 ENCOUNTER — Ambulatory Visit (INDEPENDENT_AMBULATORY_CARE_PROVIDER_SITE_OTHER): Payer: Medicare HMO | Admitting: Family Medicine

## 2020-02-18 DIAGNOSIS — Z20822 Contact with and (suspected) exposure to covid-19: Secondary | ICD-10-CM | POA: Diagnosis not present

## 2020-02-19 LAB — SARS-COV-2 RNA,(COVID-19) QUALITATIVE NAAT: SARS CoV2 RNA: NOT DETECTED

## 2020-05-20 ENCOUNTER — Other Ambulatory Visit: Payer: Self-pay | Admitting: *Deleted

## 2020-05-20 DIAGNOSIS — N401 Enlarged prostate with lower urinary tract symptoms: Secondary | ICD-10-CM

## 2020-05-20 DIAGNOSIS — N3941 Urge incontinence: Secondary | ICD-10-CM

## 2020-05-20 MED ORDER — TAMSULOSIN HCL 0.4 MG PO CAPS: 0.4000 mg | ORAL_CAPSULE | Freq: Every day | ORAL | 1 refills | Status: DC

## 2020-05-29 NOTE — Progress Notes (Signed)
Subjective:   Keith Watkins is a 75 y.o. male who presents for Medicare Annual/Subsequent preventive examination.  Review of Systems    N/A  Cardiac Risk Factors include: obesity (BMI >30kg/m2);advanced age (>37men, >26 women);male gender;sedentary lifestyle     Objective:    Today's Vitals   05/30/20 0825  BP: 140/86  Temp: 98.3 F (36.8 C)  TempSrc: Oral  Weight: (!) 353 lb 4 oz (160.2 kg)  Height: 5\' 11"  (1.803 m)   Body mass index is 49.27 kg/m.  Advanced Directives 05/30/2020 07/27/2019 06/21/2018 06/11/2015 06/20/2014 03/18/2014 02/25/2014  Does Patient Have a Medical Advance Directive? Yes Yes Yes Yes Yes Yes Yes  Type of Paramedic of [place];Living will [place];Living will;Out of facility DNR (pink MOST or yellow form) [place];Living will [place];Living will - [place];Living will [place]  Does patient want to make changes to medical advance directive? No - Patient declined - - - - - -  Copy of [place] in Chart? No - copy requested - No - copy requested No - copy requested - No - copy requested -  Pre-existing out of facility DNR order (yellow form or pink MOST form) - - - - - - -    Current Medications (verified) Outpatient Encounter Medications as of 05/30/2020  Medication Sig  . solifenacin (VESICARE) 10 MG tablet Take 1 tablet (10 mg total) by mouth daily.  . tamsulosin (FLOMAX) 0.4 MG CAPS capsule Take 1 capsule (0.4 mg total) by mouth daily.  . [DISCONTINUED] meloxicam (MOBIC) 15 MG tablet Take 15 mg by mouth daily.   No facility-administered encounter medications on file as of 05/30/2020.    Allergies (verified) Patient has no known allergies.   History: Past Medical History:  Diagnosis Date  . Arthritis   . Back pain   . Cataract   . Cellulitis 07/31/2011  . Enlarged prostate   . Foot abscess, left 08/07/2011   . Hypertension    prior to gastric bypass- no meds taken  . Lumbar spinal stenosis   . Obesity 08/09/2011   Past Surgical History:  Procedure Laterality Date  . APPENDECTOMY    . arthroscopic knee surgery    . CHOLECYSTECTOMY    . I & D EXTREMITY  08/07/2011   Procedure: IRRIGATION AND DEBRIDEMENT EXTREMITY;  Surgeon: Wylene Simmer, MD;  Location: [facility];  Service: Orthopedics;  Laterality: Left;  irrigation and debridement left foot abscess  . stomach stapled     Family History  Problem Relation Age of Onset  . Heart disease Mother   . Cancer Sister        Breast  . Breast cancer Sister   . Hypertension Brother   . Hypertension Maternal Grandmother   . Stroke Maternal Grandmother   . Heart disease Maternal Grandfather   . Hypertension Maternal Grandfather   . Diabetes Sister   . Heart disease Sister   . Hyperlipidemia Sister   . Hypertension Sister   . Stroke Sister   . Diabetes Other   . Stroke Other   . Hypertension Other   . Coronary artery disease Other   . Heart failure Other   . Colon cancer Neg Hx   . Esophageal cancer Neg Hx   . Stomach cancer Neg Hx   . Rectal cancer Neg Hx    Social History   Socioeconomic History  . Marital status: Married  Spouse name: Not on file  . Number of children: Not on file  . Years of education: Not on file  . Highest education level: Not on file  Occupational History  . Not on file  Tobacco Use  . Smoking status: Never Smoker  . Smokeless tobacco: Never Used  Substance and Sexual Activity  . Alcohol use: No  . Drug use: No  . Sexual activity: Not on file  Other Topics Concern  . Not on file  Social History Narrative  . Not on file   Social Determinants of Health   Financial Resource Strain: Low Risk   . Difficulty of Paying Living Expenses: Not hard at all  Food Insecurity: No Food Insecurity  . Worried About Charity fundraiser in the Last Year: Never true  . Ran Out of Food in the Last Year: Never true   Transportation Needs: No Transportation Needs  . Lack of Transportation (Medical): No  . Lack of Transportation (Non-Medical): No  Physical Activity: Inactive  . Days of Exercise per Week: 0 days  . Minutes of Exercise per Session: 0 min  Stress: No Stress Concern Present  . Feeling of Stress : Not at all  Social Connections: Moderately Integrated  . Frequency of Communication with Friends and Family: More than three times a week  . Frequency of Social Gatherings with Friends and Family: More than three times a week  . Attends Religious Services: More than 4 times per year  . Active Member of Clubs or Organizations: No  . Attends Archivist Meetings: Never  . Marital Status: Married    Tobacco Counseling Counseling given: Not Answered   Clinical Intake:  Pre-visit preparation completed: Yes  Pain : No/denies pain     Nutritional Risks: None Diabetes: No  How often do you need to have someone help you when you read instructions, pamphlets, or other written materials from your doctor or pharmacy?: 1 - Never  Diabetic?No   Interpreter Needed?: No  Information entered by :: [place] of Daily Living In your present state of health, do you have any difficulty performing the following activities: 05/30/2020 07/27/2019  Hearing? Y N  Vision? N N  Difficulty concentrating or making decisions? N N  Walking or climbing stairs? Y Y  Comment - Hard time with stairs, one step at time. decrease mobility  Dressing or bathing? N N  Doing errands, shopping? N N  Preparing Food and eating ? N N  Using the Toilet? N N  In the past six months, have you accidently leaked urine? Y Y  Do you have problems with loss of bowel control? N N  Managing your Medications? N N  Managing your Finances? N N  Housekeeping or managing your Housekeeping? N N  Some recent data might be hidden    Patient Care Team: Susy Frizzle, MD as PCP - General (Family  Medicine)  Indicate any recent Medical Services you may have received from other than [facility] providers in the past year (date may be approximate).     Assessment:   This is a routine wellness examination for Keith Watkins.  Hearing/Vision screen  Hearing Screening   125Hz  250Hz  500Hz  1000Hz  2000Hz  3000Hz  4000Hz  6000Hz  8000Hz   Right ear:           Left ear:           Vision Screening Comments: Patient currently wears glasses.Patient states gets eyes examined once per year   Dietary  issues and exercise activities discussed: Current Exercise Habits: The patient does not participate in regular exercise at present, Exercise limited by: orthopedic condition(s)  Goals Addressed            This Visit's Progress   . Patient Stated       I would like to get my left knee repaired     . Weight (lb) < 300 lb (136.1 kg)   353 lb 4 oz (160.2 kg)     Depression Screen PHQ 2/9 Scores 05/30/2020 07/27/2019 07/27/2019 11/21/2018 04/21/2017 04/15/2016  PHQ - 2 Score 0 0 0 0 0 0    Fall Risk Fall Risk  05/30/2020 07/27/2019 06/21/2018 06/21/2018 06/21/2018  Falls in the past year? 1 1 - 0 0  Comment - 13 months ago - - -  Number falls in past yr: 0 0 - 0 -  Injury with Fall? 0 1 - 0 -  Risk for fall due to : Impaired balance/gait - Impaired balance/gait;Impaired mobility - -  Follow up Falls evaluation completed;Falls prevention discussed Falls evaluation completed Falls evaluation completed;Education provided;Falls prevention discussed Falls evaluation completed;Falls prevention discussed -    FALL RISK PREVENTION PERTAINING TO THE HOME:  Any stairs in or around the home? Yes  If so, are there any without handrails? No  Home free of loose throw rugs in walkways, pet beds, electrical cords, etc? Yes  Adequate lighting in your home to reduce risk of falls? Yes   ASSISTIVE DEVICES UTILIZED TO PREVENT FALLS:  Life alert? No  Use of a cane, walker or w/c? No  Grab bars in the bathroom? Yes  Shower chair or  bench in shower? Yes  Elevated toilet seat or a handicapped toilet? No   TIMED UP AND GO:  Was the test performed? Yes .  Length of time to ambulate 10 feet: 5 sec.   Gait slow and steady without use of assistive device  Cognitive Function:   Normal cognitive status assessed by direct observation by this Nurse Health Advisor. No abnormalities found.     6CIT Screen 07/27/2019  What Year? 0 points  What month? 0 points  What time? 0 points  Count back from 20 0 points  Months in reverse 0 points  Repeat phrase 0 points  Total Score 0    Immunizations Immunization History  Administered Date(s) Administered  . Fluad Quad(high Dose 65+) 09/27/2018  . Influenza,inj,Quad PF,6+ Mos 02/06/2013, 02/14/2014  . Influenza-Unspecified 01/20/2017  . PFIZER(Purple Top)SARS-COV-2 Vaccination 02/17/2019, 03/14/2019  . Pneumococcal Conjugate-13 02/06/2013  . Pneumococcal Polysaccharide-23 08/09/2011    TDAP status: Due, Education has been provided regarding the importance of this vaccine. Advised may receive this vaccine at local pharmacy or Health Dept. Aware to provide a copy of the vaccination record if obtained from local pharmacy or Health Dept. Verbalized acceptance and understanding.  Flu Vaccine status: Up to date  Pneumococcal vaccine status: Up to date  Covid-19 vaccine status: Completed vaccines  Qualifies for Shingles Vaccine? Yes   Zostavax completed No   Shingrix Completed?: No.    Education has been provided regarding the importance of this vaccine. Patient has been advised to call insurance company to determine out of pocket expense if they have not yet received this vaccine. Advised may also receive vaccine at local pharmacy or Health Dept. Verbalized acceptance and understanding.  Screening Tests Health Maintenance  Topic Date Due  . Zoster Vaccines- Shingrix (1 of 2) Never done  . COVID-19 Vaccine (3 -  Booster for [employer] series) 08/14/2019  . INFLUENZA VACCINE   08/04/2020  . TETANUS/TDAP  01/04/2022  . COLONOSCOPY (Pts 45-9yrs Insurance coverage will need to be confirmed)  03/07/2024  . Hepatitis C Screening  Completed  . PNA vac Low Risk Adult  Completed  . HPV VACCINES  Aged Out    Health Maintenance  Health Maintenance Due  Topic Date Due  . Zoster Vaccines- Shingrix (1 of 2) Never done  . COVID-19 Vaccine (3 - Booster for Pfizer series) 08/14/2019    Colorectal cancer screening: Type of screening: Colonoscopy. Completed 03/08/2014. Repeat every 10 years  Lung Cancer Screening: (Low Dose CT Chest recommended if Age 80-80 years, 30 pack-year currently smoking OR have quit w/in 15years.) does not qualify.   Lung Cancer Screening Referral: N/A   Additional Screening:  Hepatitis C Screening: does qualify; Completed 04/15/2016  Vision Screening: Recommended annual ophthalmology exams for early detection of glaucoma and other disorders of the eye. Is the patient up to date with their annual eye exam?  Yes  Who is the provider or what is the name of the office in which the patient attends annual eye exams? Dr. Einar Gip  If pt is not established with a provider, would they like to be referred to a provider to establish care? No .   Dental Screening: Recommended annual dental exams for proper oral hygiene  Community Resource Referral / Chronic Care Management: CRR required this visit?  No   CCM required this visit?  No      Plan:     I have personally reviewed and noted the following in the patient's chart:   . Medical and social history . Use of alcohol, tobacco or illicit drugs  . Current medications and supplements including opioid prescriptions. Patient is not currently taking opioid prescriptions. . Functional ability and status . Nutritional status . Physical activity . Advanced directives . List of other physicians . Hospitalizations, surgeries, and ER visits in previous 12 months . Vitals . Screenings to include  cognitive, depression, and falls . Referrals and appointments  In addition, I have reviewed and discussed with patient certain preventive protocols, quality metrics, and best practice recommendations. A written personalized care plan for preventive services as well as general preventive health recommendations were provided to patient.     Ofilia Neas, LPN   1/96/2229   Nurse Notes: None

## 2020-05-30 ENCOUNTER — Ambulatory Visit (INDEPENDENT_AMBULATORY_CARE_PROVIDER_SITE_OTHER): Payer: Medicare HMO

## 2020-05-30 ENCOUNTER — Telehealth: Payer: Self-pay | Admitting: Family Medicine

## 2020-05-30 ENCOUNTER — Other Ambulatory Visit: Payer: Self-pay

## 2020-05-30 VITALS — BP 140/86 | Temp 98.3°F | Ht 71.0 in | Wt 353.2 lb

## 2020-05-30 DIAGNOSIS — Z0000 Encounter for general adult medical examination without abnormal findings: Secondary | ICD-10-CM

## 2020-05-30 DIAGNOSIS — R3915 Urgency of urination: Secondary | ICD-10-CM

## 2020-05-30 NOTE — Telephone Encounter (Signed)
urology

## 2020-05-30 NOTE — Telephone Encounter (Signed)
Spoke with patient and informed him that we are going to place a referral to Urology patient verbalized understanding.

## 2020-05-30 NOTE — Telephone Encounter (Signed)
During medicare wellness visit patient mentioned that he is having issues still with urinary urgency and frequency. He is currently taking Vesicare and flomax with no relief. States that he does not get and urge to urinate usually until he stands up and then has to sudden urge that usually leaves him unable to make it to the restroom. He would like to know if you recommend a medication change and or if you recommend that he sees urology since this has been an ongoing issues. Please advise

## 2020-05-30 NOTE — Patient Instructions (Signed)
Keith Watkins , Thank you for taking time to come for your Medicare Wellness Visit. I appreciate your ongoing commitment to your health goals. Please review the following plan we discussed and let me know if I can assist you in the future.   Screening recommendations/referrals: Colonoscopy: Up to date, next due 03/07/2024 Recommended yearly ophthalmology/optometry visit for glaucoma screening and checkup Recommended yearly dental visit for hygiene and checkup  Vaccinations: Influenza vaccine: Up to date, next due fall 2022 Pneumococcal vaccine: Completed series  Tdap vaccine: Currently due, you may await  Shingles vaccine: Currently due, you may receive at your local pharmacy.    Advanced directives: Please bring in a copy of your advanced medical directives so we can scan into your chart.  Conditions/risks identified: None   Next appointment: None  Preventive Care 65 Years and Older, Male Preventive care refers to lifestyle choices and visits with your health care provider that can promote health and wellness. What does preventive care include?  A yearly physical exam. This is also called an annual well check.  Dental exams once or twice a year.  Routine eye exams. Ask your health care provider how often you should have your eyes checked.  Personal lifestyle choices, including:  Daily care of your teeth and gums.  Regular physical activity.  Eating a healthy diet.  Avoiding tobacco and drug use.  Limiting alcohol use.  Practicing safe sex.  Taking low doses of aspirin every day.  Taking vitamin and mineral supplements as recommended by your health care provider. What happens during an annual well check? The services and screenings done by your health care provider during your annual well check will depend on your age, overall health, lifestyle risk factors, and family history of disease. Counseling  Your health care provider may ask you questions about your:  Alcohol  use.  Tobacco use.  Drug use.  Emotional well-being.  Home and relationship well-being.  Sexual activity.  Eating habits.  History of falls.  Memory and ability to understand (cognition).  Work and work Statistician. Screening  You may have the following tests or measurements:  Height, weight, and BMI.  Blood pressure.  Lipid and cholesterol levels. These may be checked every 5 years, or more frequently if you are over 39 years old.  Skin check.  Lung cancer screening. You may have this screening every year starting at age 57 if you have a 30-pack-year history of smoking and currently smoke or have quit within the past 15 years.  Fecal occult blood test (FOBT) of the stool. You may have this test every year starting at age 55.  Flexible sigmoidoscopy or colonoscopy. You may have a sigmoidoscopy every 5 years or a colonoscopy every 10 years starting at age 22.  Prostate cancer screening. Recommendations will vary depending on your family history and other risks.  Hepatitis C blood test.  Hepatitis B blood test.  Sexually transmitted disease (STD) testing.  Diabetes screening. This is done by checking your blood sugar (glucose) after you have not eaten for a while (fasting). You may have this done every 1-3 years.  Abdominal aortic aneurysm (AAA) screening. You may need this if you are a current or former smoker.  Osteoporosis. You may be screened starting at age 57 if you are at high risk. Talk with your health care provider about your test results, treatment options, and if necessary, the need for more tests. Vaccines  Your health care provider may recommend certain vaccines, such as:  Influenza vaccine. This is recommended every year.  Tetanus, diphtheria, and acellular pertussis (Tdap, Td) vaccine. You may need a Td booster every 10 years.  Zoster vaccine. You may need this after age 19.  Pneumococcal 13-valent conjugate (PCV13) vaccine. One dose is  recommended after age 32.  Pneumococcal polysaccharide (PPSV23) vaccine. One dose is recommended after age 51. Talk to your health care provider about which screenings and vaccines you need and how often you need them. This information is not intended to replace advice given to you by your health care provider. Make sure you discuss any questions you have with your health care provider. Document Released: 01/17/2015 Document Revised: 09/10/2015 Document Reviewed: 10/22/2014 Elsevier Interactive Patient Education  2017 [place] Prevention in the Home Falls can cause injuries. They can happen to people of all ages. There are many things you can do to make your home safe and to help prevent falls. What can I do on the outside of my home?  Regularly fix the edges of walkways and driveways and fix any cracks.  Remove anything that might make you trip as you walk through a door, such as a raised step or threshold.  Trim any bushes or trees on the path to your home.  Use bright outdoor lighting.  Clear any walking paths of anything that might make someone trip, such as rocks or tools.  Regularly check to see if handrails are loose or broken. Make sure that both sides of any steps have handrails.  Any raised decks and porches should have guardrails on the edges.  Have any leaves, snow, or ice cleared regularly.  Use sand or salt on walking paths during winter.  Clean up any spills in your garage right away. This includes oil or grease spills. What can I do in the bathroom?  Use night lights.  Install grab bars by the toilet and in the tub and shower. Do not use towel bars as grab bars.  Use non-skid mats or decals in the tub or shower.  If you need to sit down in the shower, use a plastic, non-slip stool.  Keep the floor dry. Clean up any water that spills on the floor as soon as it happens.  Remove soap buildup in the tub or shower regularly.  Attach bath mats  securely with double-sided non-slip rug tape.  Do not have throw rugs and other things on the floor that can make you trip. What can I do in the bedroom?  Use night lights.  Make sure that you have a light by your bed that is easy to reach.  Do not use any sheets or blankets that are too big for your bed. They should not hang down onto the floor.  Have a firm chair that has side arms. You can use this for support while you get dressed.  Do not have throw rugs and other things on the floor that can make you trip. What can I do in the kitchen?  Clean up any spills right away.  Avoid walking on wet floors.  Keep items that you use a lot in easy-to-reach places.  If you need to reach something above you, use a strong step stool that has a grab bar.  Keep electrical cords out of the way.  Do not use floor polish or wax that makes floors slippery. If you must use wax, use non-skid floor wax.  Do not have throw rugs and other things on the floor that  can make you trip. What can I do with my stairs?  Do not leave any items on the stairs.  Make sure that there are handrails on both sides of the stairs and use them. Fix handrails that are broken or loose. Make sure that handrails are as long as the stairways.  Check any carpeting to make sure that it is firmly attached to the stairs. Fix any carpet that is loose or worn.  Avoid having throw rugs at the top or bottom of the stairs. If you do have throw rugs, attach them to the floor with carpet tape.  Make sure that you have a light switch at the top of the stairs and the bottom of the stairs. If you do not have them, ask someone to add them for you. What else can I do to help prevent falls?  Wear shoes that:  Do not have high heels.  Have rubber bottoms.  Are comfortable and fit you well.  Are closed at the toe. Do not wear sandals.  If you use a stepladder:  Make sure that it is fully opened. Do not climb a closed  stepladder.  Make sure that both sides of the stepladder are locked into place.  Ask someone to hold it for you, if possible.  Clearly mark and make sure that you can see:  Any grab bars or handrails.  First and last steps.  Where the edge of each step is.  Use tools that help you move around (mobility aids) if they are needed. These include:  Canes.  Walkers.  Scooters.  Crutches.  Turn on the lights when you go into a dark area. Replace any light bulbs as soon as they burn out.  Set up your furniture so you have a clear path. Avoid moving your furniture around.  If any of your floors are uneven, fix them.  If there are any pets around you, be aware of where they are.  Review your medicines with your doctor. Some medicines can make you feel dizzy. This can increase your chance of falling. Ask your doctor what other things that you can do to help prevent falls. This information is not intended to replace advice given to you by your health care provider. Make sure you discuss any questions you have with your health care provider. Document Released: 10/17/2008 Document Revised: 05/29/2015 Document Reviewed: 01/25/2014 Elsevier Interactive Patient Education  2017 [employer].

## 2020-08-06 DIAGNOSIS — R3912 Poor urinary stream: Secondary | ICD-10-CM | POA: Diagnosis not present

## 2020-08-06 DIAGNOSIS — R350 Frequency of micturition: Secondary | ICD-10-CM | POA: Diagnosis not present

## 2020-08-06 DIAGNOSIS — N3941 Urge incontinence: Secondary | ICD-10-CM | POA: Diagnosis not present

## 2020-08-19 ENCOUNTER — Emergency Department (HOSPITAL_COMMUNITY)
Admission: EM | Admit: 2020-08-19 | Discharge: 2020-08-20 | Disposition: A | Discharge status: Home / Self Care | Payer: Medicare HMO | Attending: Emergency Medicine | Admitting: Emergency Medicine

## 2020-08-19 ENCOUNTER — Encounter (HOSPITAL_COMMUNITY): Payer: Self-pay

## 2020-08-19 ENCOUNTER — Other Ambulatory Visit: Payer: Self-pay

## 2020-08-19 DIAGNOSIS — M79605 Pain in left leg: Secondary | ICD-10-CM | POA: Diagnosis not present

## 2020-08-19 DIAGNOSIS — M7989 Other specified soft tissue disorders: Secondary | ICD-10-CM | POA: Diagnosis not present

## 2020-08-19 DIAGNOSIS — M1712 Unilateral primary osteoarthritis, left knee: Secondary | ICD-10-CM | POA: Diagnosis not present

## 2020-08-19 DIAGNOSIS — L03116 Cellulitis of left lower limb: Secondary | ICD-10-CM | POA: Diagnosis not present

## 2020-08-19 DIAGNOSIS — I10 Essential (primary) hypertension: Secondary | ICD-10-CM | POA: Insufficient documentation

## 2020-08-19 LAB — COMPREHENSIVE METABOLIC PANEL
ALT: 17 U/L (ref 0–44)
AST: 22 U/L (ref 15–41)
Albumin: 2.8 g/dL — ABNORMAL LOW (ref 3.5–5.0)
Alkaline Phosphatase: 67 U/L (ref 38–126)
Anion gap: 5 (ref 5–15)
BUN: 16 mg/dL (ref 8–23)
CO2: 30 mmol/L (ref 22–32)
Calcium: 8.6 mg/dL — ABNORMAL LOW (ref 8.9–10.3)
Chloride: 104 mmol/L (ref 98–111)
Creatinine, Ser: 0.89 mg/dL (ref 0.61–1.24)
GFR, Estimated: >60 mL/min (ref >60)
Glucose, Bld: 109 mg/dL — ABNORMAL HIGH (ref 70–99)
Potassium: 4.5 mmol/L (ref 3.5–5.1)
Sodium: 139 mmol/L (ref 135–145)
Total Bilirubin: 1.5 mg/dL — ABNORMAL HIGH (ref 0.3–1.2)
Total Protein: 6.4 g/dL — ABNORMAL LOW (ref 6.5–8.1)

## 2020-08-19 LAB — CBC WITH DIFFERENTIAL/PLATELET
Abs Immature Granulocytes: 0.02 10*3/uL (ref 0.00–0.07)
Basophils Absolute: 0.1 10*3/uL (ref 0.0–0.1)
Basophils Relative: 1 %
Eosinophils Absolute: 0.8 10*3/uL — ABNORMAL HIGH (ref 0.0–0.5)
Eosinophils Relative: 10 %
HCT: 43.2 % (ref 39.0–52.0)
Hemoglobin: 13.8 g/dL (ref 13.0–17.0)
Immature Granulocytes: 0 %
Lymphocytes Relative: 13 %
Lymphs Abs: 1 10*3/uL (ref 0.7–4.0)
MCH: 30.3 pg (ref 26.0–34.0)
MCHC: 31.9 g/dL (ref 30.0–36.0)
MCV: 94.9 fL (ref 80.0–100.0)
Monocytes Absolute: 1.2 10*3/uL — ABNORMAL HIGH (ref 0.1–1.0)
Monocytes Relative: 15 %
Neutro Abs: 4.7 10*3/uL (ref 1.7–7.7)
Neutrophils Relative %: 61 %
Platelets: 270 10*3/uL (ref 150–400)
RBC: 4.55 MIL/uL (ref 4.22–5.81)
RDW: 12.6 % (ref 11.5–15.5)
WBC: 7.9 10*3/uL (ref 4.0–10.5)
nRBC: 0 % (ref 0.0–0.2)

## 2020-08-19 NOTE — ED Provider Notes (Signed)
Emergency Medicine Provider Triage Evaluation Note  Keith Watkins , a 75 y.o. male  was evaluated in triage.  Pt complains of swelling right leg  Pt sent here from [facility] for evaltuion of possible cellulitis vs dvt  Review of Systems  Positive: Redness and swelling  Negative: No fever  Physical Exam  BP 124/85 (BP Location: Left Arm)   Pulse (!) 102   Temp 99 F (37.2 C) (Oral)   Resp 20   Ht '5\' 11"'$  (1.803 m)   Wt (!) 160 kg   SpO2 96%   BMI 49.20 kg/m  Gen:   Awake, no distress   Resp:  Normal effort MSK:   Moves extremities without difficulty Other:red swollen left leg,  hot to touch     Medical Decision Making  Medically screening exam initiated at 8:44 PM.  Appropriate orders placed.  Keith Watkins was informed that the remainder of the evaluation will be completed by another provider, this initial triage assessment does not replace that evaluation, and the importance of remaining in the ED until their evaluation is complete.     Fransico Meadow, [place] 08/19/20 2045    Jeanell Sparrow, DO 08/20/20 [phone]

## 2020-08-19 NOTE — ED Triage Notes (Signed)
Right leg swelling, redness and warm to touch. Pt was advised by urgent care to come to ED - concerned for blood clot. Not on blood thinners. Usually drives [id number] long.

## 2020-08-20 ENCOUNTER — Emergency Department (HOSPITAL_BASED_OUTPATIENT_CLINIC_OR_DEPARTMENT_OTHER): Payer: Medicare HMO

## 2020-08-20 ENCOUNTER — Emergency Department (HOSPITAL_COMMUNITY): Payer: Medicare HMO

## 2020-08-20 DIAGNOSIS — R609 Edema, unspecified: Secondary | ICD-10-CM | POA: Diagnosis not present

## 2020-08-20 DIAGNOSIS — M7989 Other specified soft tissue disorders: Secondary | ICD-10-CM

## 2020-08-20 DIAGNOSIS — M1712 Unilateral primary osteoarthritis, left knee: Secondary | ICD-10-CM | POA: Diagnosis not present

## 2020-08-20 LAB — D-DIMER, QUANTITATIVE: D-Dimer, Quant: 1.01 ug/mL-FEU — ABNORMAL HIGH (ref 0.00–0.50)

## 2020-08-20 LAB — LACTIC ACID, PLASMA: Lactic Acid, Venous: 2.2 mmol/L (ref 0.5–1.9)

## 2020-08-20 MED ORDER — DEXTROSE 5 % IV SOLN
1500.0000 mg | Freq: Once | INTRAVENOUS | Status: AC
  Administered 2020-08-20: 1500 mg via INTRAVENOUS
  Filled 2020-08-20: qty 75

## 2020-08-20 NOTE — ED Provider Notes (Signed)
  Physical Exam  BP (!) 114/43 (BP Location: Left Arm)   Pulse 94   Temp 98.4 F (36.9 C) (Oral)   Resp 20   Ht 1.803 m ('5\' 11"'$ )   Wt (!) 160 kg   SpO2 96%   BMI 49.20 kg/m   Physical Exam  ED Course/Procedures     Procedures  MDM  75 yo male with leg cellulitis, receiving dalvance.  Patient to have doppler study.  Probable d/c pending above No evidence of DVT on Doppler study We will proceed with Dalvance and discharged to home Discussed above with patient and voices understanding of plan.       Pattricia Boss, MD 08/20/20 [phone]

## 2020-08-20 NOTE — ED Provider Notes (Signed)
[facility] EMERGENCY DEPARTMENT Provider Note   CSN: [id number] Arrival date & time: 08/19/20  2014     History No chief complaint on file.   Keith Watkins is a 75 y.o. male.  Patient referred to the emergency department from orthopedics to evaluate for possible DVT.  Patient reports that he woke up this morning with a swollen left lower leg.  Both of his legs swell chronically but usually they go down after he sleeps for the night.  Over the course of the day leg has been persistently swollen, has become painful, now red and warm to the touch.  He does have a history of recurrent cellulitis in the left leg.  Denies any new trauma.        Past Medical History:  Diagnosis Date   Arthritis    Back pain    Cataract    Cellulitis 07/31/2011   Enlarged prostate    Foot abscess, left 08/07/2011   Hypertension    prior to gastric bypass- no meds taken   Lumbar spinal stenosis    Obesity 08/09/2011    Patient Active Problem List   Diagnosis Date Noted   Morbid obesity with BMI of 45.0-49.9, adult ([place]) 06/21/2018   Bilateral lower extremity edema 06/21/2018   Cellulitis of left lower leg 08/21/2016   Eosinophilia 03/18/2014   Lumbar spinal stenosis    Hypertension    Benign prostatic hyperplasia (BPH) with urinary urge incontinence 08/09/2011    Past Surgical History:  Procedure Laterality Date   APPENDECTOMY     arthroscopic knee surgery     CHOLECYSTECTOMY     I & D EXTREMITY  08/07/2011   Procedure: IRRIGATION AND DEBRIDEMENT EXTREMITY;  Surgeon: Wylene Simmer, MD;  Location: [facility];  Service: Orthopedics;  Laterality: Left;  irrigation and debridement left foot abscess   stomach stapled         Family History  Problem Relation Age of Onset   Heart disease Mother    Cancer Sister        Breast   Breast cancer Sister    Hypertension Brother    Hypertension Maternal Grandmother    Stroke Maternal Grandmother    Heart disease Maternal Grandfather     Hypertension Maternal Grandfather    Diabetes Sister    Heart disease Sister    Hyperlipidemia Sister    Hypertension Sister    Stroke Sister    Diabetes Other    Stroke Other    Hypertension Other    Coronary artery disease Other    Heart failure Other    Colon cancer Neg Hx    Esophageal cancer Neg Hx    Stomach cancer Neg Hx    Rectal cancer Neg Hx     Social History   Tobacco Use   Smoking status: Never   Smokeless tobacco: Never  Substance Use Topics   Alcohol use: No   Drug use: No    Home Medications Prior to Admission medications   Medication Sig Start Date End Date Taking? Authorizing Provider  solifenacin (VESICARE) 10 MG tablet Take 1 tablet (10 mg total) by mouth daily. 08/03/19   Susy Frizzle, MD  tamsulosin (FLOMAX) 0.4 MG CAPS capsule Take 1 capsule (0.4 mg total) by mouth daily. 05/20/20   Susy Frizzle, MD    Allergies    Patient has no known allergies.  Review of Systems   Review of Systems  Respiratory:  Negative for shortness of  breath.   Cardiovascular:  Positive for leg swelling. Negative for chest pain.  Skin:  Positive for color change.   Physical Exam Updated Vital Signs BP (!) 124/56 (BP Location: Left Arm)   Pulse 96   Temp 98.4 F (36.9 C) (Oral)   Resp (!) 22   Ht '5\' 11"'$  (1.803 m)   Wt (!) 160 kg   SpO2 95%   BMI 49.20 kg/m   Physical Exam Vitals and nursing note reviewed.  Constitutional:      General: He is not in acute distress.    Appearance: Normal appearance. He is well-developed.  HENT:     Head: Normocephalic and atraumatic.     Right Ear: Hearing normal.     Left Ear: Hearing normal.     Nose: Nose normal.  Eyes:     Conjunctiva/sclera: Conjunctivae normal.     Pupils: Pupils are equal, round, and reactive to light.  Cardiovascular:     Rate and Rhythm: Regular rhythm.     Heart sounds: S1 normal and S2 normal. No murmur heard.   No friction rub. No gallop.  Pulmonary:     Effort: Pulmonary effort  is normal. No respiratory distress.     Breath sounds: Normal breath sounds.  Chest:     Chest wall: No tenderness.  Abdominal:     General: Bowel sounds are normal.     Palpations: Abdomen is soft.     Tenderness: There is no abdominal tenderness. There is no guarding or rebound. Negative signs include Murphy's sign and McBurney's sign.     Hernia: No hernia is present.  Musculoskeletal:        General: Normal range of motion.     Cervical back: Normal range of motion and neck supple.     Right lower leg: Edema present.     Left lower leg: Edema present.     Comments: Bilateral lower extremity edema, left much more than the right.  Erythema left lower extremity from knee down with increased warmth.  Skin:    General: Skin is warm and dry.     Findings: Erythema present. No rash.  Neurological:     Mental Status: He is alert and oriented to person, place, and time.     GCS: GCS eye subscore is 4. GCS verbal subscore is 5. GCS motor subscore is 6.     Cranial Nerves: No cranial nerve deficit.     Sensory: No sensory deficit.     Coordination: Coordination normal.  Psychiatric:        Speech: Speech normal.        Behavior: Behavior normal.        Thought Content: Thought content normal.    ED Results / Procedures / Treatments   Labs (all labs ordered are listed, but only abnormal results are displayed) Labs Reviewed  CBC WITH DIFFERENTIAL/PLATELET - Abnormal; Notable for the following components:      Result Value   Monocytes Absolute 1.2 (*)    Eosinophils Absolute 0.8 (*)    All other components within normal limits  COMPREHENSIVE METABOLIC PANEL - Abnormal; Notable for the following components:   Glucose, Bld 109 (*)    Calcium 8.6 (*)    Total Protein 6.4 (*)    Albumin 2.8 (*)    Total Bilirubin 1.5 (*)    All other components within normal limits  LACTIC ACID, PLASMA - Abnormal; Notable for the following components:   Lactic Acid, Venous 2.2 (*)  All other  components within normal limits  D-DIMER, QUANTITATIVE - Abnormal; Notable for the following components:   D-Dimer, Quant 1.01 (*)    All other components within normal limits  CULTURE, BLOOD (SINGLE)    EKG None  Radiology DG Tibia/Fibula Left  Result Date: 08/20/2020 CLINICAL DATA:  Pain, swelling, redness EXAM: LEFT TIBIA AND FIBULA - 2 VIEW COMPARISON:  None. FINDINGS: Diffuse soft tissue swelling. No acute bony abnormality. Specifically, no fracture, subluxation, or dislocation. Advanced degenerative changes in the left knee. Soft tissue calcifications. No radiopaque foreign body or gas. IMPRESSION: No acute bony abnormality. Electronically Signed   By: Rolm Baptise M.D.   On: 08/20/2020 02:17    Procedures Procedures   Medications Ordered in ED Medications - No data to display  ED Course  I have reviewed the triage vital signs and the nursing notes.  Pertinent labs & imaging results that were available during my care of the patient were reviewed by me and considered in my medical decision making (see chart for details).    MDM Rules/Calculators/A&P                           Patient presents to the emergency department for evaluation of redness and swelling of left lower extremity.  Patient has had previous cellulitis in this area.  He was seen by orthopedics today and referred to the ER to rule out DVT.  Examination is much more consistent with infection.  Unfortunately vascular ultrasound not available at night.  Patient with SIRS criteria, mild tachycardia, mild tachypnea, slightly elevated lactic acid at 2.2.  Vital signs otherwise unremarkable.  He appears well.  Appears to be a good candidate for outpatient management of acute cellulitis with Dalvance.  Will, however, hold in the ER until morning to rule out DVT prior to discharge.  Final Clinical Impression(s) / ED Diagnoses Final diagnoses:  Cellulitis and abscess of left leg    Rx / DC Orders ED Discharge Orders      None        Orpah Greek, MD 08/20/20 [phone]

## 2020-08-20 NOTE — Discharge Instructions (Addendum)
You were given a shot of long-acting antibiotic here.  Please keep your leg elevated.  Return if the redness is spreading, fever, or other new symptoms.  You need to follow-up with your doctor in 1 to 2 days for recheck. There was no evidence of blood clot seen on studies today.

## 2020-08-20 NOTE — ED Provider Notes (Signed)
[facility] EMERGENCY DEPARTMENT Provider Note   CSN: [id number] Arrival date & time: 08/19/20  2014     History No chief complaint on file.   Keith Watkins is a 75 y.o. male.  Patient referred to the emergency department from orthopedics to evaluate for possible DVT.  Patient reports that he woke up this morning with a swollen left lower leg.  Both of his legs swell chronically but usually they go down after he sleeps for the night.  Over the course of the day leg has been persistently swollen, has become painful, now red and warm to the touch.  He does have a history of recurrent cellulitis in the left leg.  Denies any new trauma.       Past Medical History:  Diagnosis Date   Arthritis    Back pain    Cataract    Cellulitis 07/31/2011   Enlarged prostate    Foot abscess, left 08/07/2011   Hypertension    prior to gastric bypass- no meds taken   Lumbar spinal stenosis    Obesity 08/09/2011    Patient Active Problem List   Diagnosis Date Noted   Morbid obesity with BMI of 45.0-49.9, adult ([place]) 06/21/2018   Bilateral lower extremity edema 06/21/2018   Cellulitis of left lower leg 08/21/2016   Eosinophilia 03/18/2014   Lumbar spinal stenosis    Hypertension    Benign prostatic hyperplasia (BPH) with urinary urge incontinence 08/09/2011    Past Surgical History:  Procedure Laterality Date   APPENDECTOMY     arthroscopic knee surgery     CHOLECYSTECTOMY     I & D EXTREMITY  08/07/2011   Procedure: IRRIGATION AND DEBRIDEMENT EXTREMITY;  Surgeon: Wylene Simmer, MD;  Location: [facility];  Service: Orthopedics;  Laterality: Left;  irrigation and debridement left foot abscess   stomach stapled         Family History  Problem Relation Age of Onset   Heart disease Mother    Cancer Sister        Breast   Breast cancer Sister    Hypertension Brother    Hypertension Maternal Grandmother    Stroke Maternal Grandmother    Heart disease Maternal Grandfather     Hypertension Maternal Grandfather    Diabetes Sister    Heart disease Sister    Hyperlipidemia Sister    Hypertension Sister    Stroke Sister    Diabetes Other    Stroke Other    Hypertension Other    Coronary artery disease Other    Heart failure Other    Colon cancer Neg Hx    Esophageal cancer Neg Hx    Stomach cancer Neg Hx    Rectal cancer Neg Hx     Social History   Tobacco Use   Smoking status: Never   Smokeless tobacco: Never  Substance Use Topics   Alcohol use: No   Drug use: No    Home Medications Prior to Admission medications   Medication Sig Start Date End Date Taking? Authorizing Provider  solifenacin (VESICARE) 10 MG tablet Take 1 tablet (10 mg total) by mouth daily. 08/03/19   Susy Frizzle, MD  tamsulosin (FLOMAX) 0.4 MG CAPS capsule Take 1 capsule (0.4 mg total) by mouth daily. 05/20/20   Susy Frizzle, MD    Allergies    Patient has no known allergies.  Review of Systems   Review of Systems  Respiratory:  Negative for shortness of breath.  Cardiovascular:  Positive for leg swelling. Negative for chest pain.  Skin:  Positive for color change.  All other systems reviewed and are negative.  Physical Exam Updated Vital Signs BP (!) 124/56 (BP Location: Left Arm)   Pulse 96   Temp 98.4 F (36.9 C) (Oral)   Resp (!) 22   Ht '5\' 11"'$  (1.803 m)   Wt (!) 160 kg   SpO2 95%   BMI 49.20 kg/m   Physical Exam Vitals and nursing note reviewed.  Constitutional:      General: He is not in acute distress.    Appearance: Normal appearance. He is well-developed.  HENT:     Head: Normocephalic and atraumatic.     Right Ear: Hearing normal.     Left Ear: Hearing normal.     Nose: Nose normal.  Eyes:     Conjunctiva/sclera: Conjunctivae normal.     Pupils: Pupils are equal, round, and reactive to light.  Cardiovascular:     Rate and Rhythm: Regular rhythm.     Heart sounds: S1 normal and S2 normal. No murmur heard.   No friction rub. No  gallop.  Pulmonary:     Effort: Pulmonary effort is normal. No respiratory distress.     Breath sounds: Normal breath sounds.  Chest:     Chest wall: No tenderness.  Abdominal:     General: Bowel sounds are normal.     Palpations: Abdomen is soft.     Tenderness: There is no abdominal tenderness. There is no guarding or rebound. Negative signs include Murphy's sign and McBurney's sign.     Hernia: No hernia is present.  Musculoskeletal:        General: Normal range of motion.     Cervical back: Normal range of motion and neck supple.     Right lower leg: Edema present.     Left lower leg: Edema present.     Comments: Bilateral leg pitting edema, left greater than right.  Associated erythema and warmth of left lower leg  Skin:    General: Skin is warm and dry.     Findings: Erythema present. No rash.  Neurological:     Mental Status: He is alert and oriented to person, place, and time.     GCS: GCS eye subscore is 4. GCS verbal subscore is 5. GCS motor subscore is 6.     Cranial Nerves: No cranial nerve deficit.     Sensory: No sensory deficit.     Coordination: Coordination normal.  Psychiatric:        Speech: Speech normal.        Behavior: Behavior normal.        Thought Content: Thought content normal.    ED Results / Procedures / Treatments   Labs (all labs ordered are listed, but only abnormal results are displayed) Labs Reviewed  CBC WITH DIFFERENTIAL/PLATELET - Abnormal; Notable for the following components:      Result Value   Monocytes Absolute 1.2 (*)    Eosinophils Absolute 0.8 (*)    All other components within normal limits  COMPREHENSIVE METABOLIC PANEL - Abnormal; Notable for the following components:   Glucose, Bld 109 (*)    Calcium 8.6 (*)    Total Protein 6.4 (*)    Albumin 2.8 (*)    Total Bilirubin 1.5 (*)    All other components within normal limits  LACTIC ACID, PLASMA - Abnormal; Notable for the following components:   Lactic Acid, Venous 2.2  (*)  All other components within normal limits  D-DIMER, QUANTITATIVE - Abnormal; Notable for the following components:   D-Dimer, Quant 1.01 (*)    All other components within normal limits  CULTURE, BLOOD (SINGLE)    EKG None  Radiology DG Tibia/Fibula Left  Result Date: 08/20/2020 CLINICAL DATA:  Pain, swelling, redness EXAM: LEFT TIBIA AND FIBULA - 2 VIEW COMPARISON:  None. FINDINGS: Diffuse soft tissue swelling. No acute bony abnormality. Specifically, no fracture, subluxation, or dislocation. Advanced degenerative changes in the left knee. Soft tissue calcifications. No radiopaque foreign body or gas. IMPRESSION: No acute bony abnormality. Electronically Signed   By: Rolm Baptise M.D.   On: 08/20/2020 02:17    Procedures Procedures   Medications Ordered in ED Medications - No data to display  ED Course  I have reviewed the triage vital signs and the nursing notes.  Pertinent labs & imaging results that were available during my care of the patient were reviewed by me and considered in my medical decision making (see chart for details).    MDM Rules/Calculators/A&P                           Patient presents with swelling, redness and warmth of the left lower leg.  He has had cellulitis in this area previously.  Examination is concerning for recurrent cellulitis.  Patient tachycardic and tachypneic in the ED.  He is, however, afebrile with a normal white blood cell count.  As he does have SIRS criteria, considered admission.  He does, however, appear well and a good candidate for outpatient treatment with Dalvance and close follow-up.  Antibiotic coverage ordered.  We will hold patient until morning and perform venous duplex to rule out DVT prior to discharge.  Will sign to oncoming ER physician to follow-up on duplex.  If negative, discharged with outpatient follow-up for recheck of cellulitis.  Final Clinical Impression(s) / ED Diagnoses Final diagnoses:  Cellulitis of left  lower extremity    Rx / DC Orders ED Discharge Orders     None        Hudson Lehmkuhl, Gwenyth Allegra, MD 08/20/20 [phone]

## 2020-08-20 NOTE — Progress Notes (Signed)
Pharmacy Antibiotic Note  Keith Watkins is a 75 y.o. male admitted on 08/19/2020 with SSTI. Pharmacy has been consulted for dalbavancin dosing. Dalbavancin consult was placed overnight and consult was passed off to morning shift per plan that was relayed from overnight team. Plan communicated from overnight team is that we are waiting for doppler study to result, and if doppler study negative then patient to get Dalbavacin. Delay in Dalbavancin being ordered as waiting for result of doppler study to be denoted in chart.  Plan: Dalbavancin '1500mg'$  IV once over 30 minutes.  Height: '5\' 11"'$  (180.3 cm) Weight: (!) 160 kg (352 lb 11.8 oz) IBW/kg (Calculated) : 75.3  Temp (24hrs), Avg:98.6 F (37 C), Min:98.4 F (36.9 C), Max:99 F (37.2 C)  Recent Labs  Lab 08/19/20 2048 08/20/20 0202  WBC 7.9  --   CREATININE 0.89  --   LATICACIDVEN  --  2.2*    Estimated Creatinine Clearance: 112.5 mL/min (by C-G formula based on SCr of 0.89 mg/dL).    No Known Allergies  Antimicrobials this admission: Dalbavancin 8/17 x1 dose  Thank you for allowing pharmacy to be a part of this patient's care.  Joetta Manners, PharmD, [facility] Emergency Medicine Clinical Pharmacist ED RPh Phone: [place]: [phone]

## 2020-08-20 NOTE — Progress Notes (Signed)
Lower extremity venous has been completed.   Preliminary results in CV Proc.   Abram Sander 08/20/2020 8:59 AM

## 2020-08-22 ENCOUNTER — Telehealth: Payer: Self-pay | Admitting: *Deleted

## 2020-08-22 DIAGNOSIS — M79662 Pain in left lower leg: Secondary | ICD-10-CM | POA: Diagnosis not present

## 2020-08-22 NOTE — Telephone Encounter (Signed)
Received call from patient.   Reports that he is being seen for cellulitis to LLE. Stated that ortho has sent him to ER for [place] and no DVT noted. He was started on ABTx. Reports that he will be referred to vascular specialists from ortho as well.   FYI.

## 2020-08-25 ENCOUNTER — Other Ambulatory Visit: Payer: Self-pay | Admitting: Family Medicine

## 2020-08-25 LAB — CULTURE, BLOOD (SINGLE): Culture: NO GROWTH

## 2020-08-27 NOTE — Telephone Encounter (Signed)
Received call from patient.   States that he received Dalvance (dalbavancin) in ER for cellulitis. States that infusion is 1 dose, 30-minute IV antibiotic that provides a full course of outpatient treatment for skin infections.   Patient reports that leg has improved, but has not completely resolved.   Appointment scheduled for follow up.

## 2020-08-28 ENCOUNTER — Encounter: Payer: Self-pay | Admitting: Family Medicine

## 2020-08-28 ENCOUNTER — Ambulatory Visit (INDEPENDENT_AMBULATORY_CARE_PROVIDER_SITE_OTHER): Payer: Medicare HMO | Admitting: Family Medicine

## 2020-08-28 ENCOUNTER — Other Ambulatory Visit: Payer: Self-pay

## 2020-08-28 VITALS — BP 122/68 | HR 95 | Temp 98.1°F | Resp 16 | Ht 71.0 in | Wt 326.0 lb

## 2020-08-28 DIAGNOSIS — L03116 Cellulitis of left lower limb: Secondary | ICD-10-CM

## 2020-08-28 MED ORDER — SULFAMETHOXAZOLE-TRIMETHOPRIM 800-160 MG PO TABS: 1.0000 | ORAL_TABLET | Freq: Two times a day (BID) | ORAL | 0 refills | Status: DC

## 2020-08-28 NOTE — Progress Notes (Signed)
Subjective:    Patient ID: Keith Watkins, male    DOB: 1945/01/15, 75 y.o.   MRN: [id number]  HPI [name] to the ER 1 week ago with left leg swelling, redness, and pain.  Ultrasound was negative for DVT.  Was given Dalvance for cellulitis concerning for MRSA given his previous history.  Redness is improved all over the leg except for an area over the anterior lower shin.  There the area is warm hot and tender still.  Patient jumps with gentle palpation.  I do not appreciate any fluctuance.  It is firm but it is still red and warm to the touch Past Medical History:  Diagnosis Date   Arthritis    Back pain    Cataract    Cellulitis 07/31/2011   Enlarged prostate    Foot abscess, left 08/07/2011   Hypertension    prior to gastric bypass- no meds taken   Lumbar spinal stenosis    Obesity 08/09/2011   Past Surgical History:  Procedure Laterality Date   APPENDECTOMY     arthroscopic knee surgery     CHOLECYSTECTOMY     I & D EXTREMITY  08/07/2011   Procedure: IRRIGATION AND DEBRIDEMENT EXTREMITY;  Surgeon: Wylene Simmer, MD;  Location: [facility];  Service: Orthopedics;  Laterality: Left;  irrigation and debridement left foot abscess   stomach stapled     Current Outpatient Medications on File Prior to Visit  Medication Sig Dispense Refill   mirabegron ER (MYRBETRIQ) 50 MG TB24 tablet Take 50 mg by mouth daily.     tamsulosin (FLOMAX) 0.4 MG CAPS capsule Take 1 capsule (0.4 mg total) by mouth daily. 90 capsule 1   No current facility-administered medications on file prior to visit.   No Known Allergies Social History   Socioeconomic History   Marital status: Married    Spouse name: Not on file   Number of children: Not on file   Years of education: Not on file   Highest education level: Not on file  Occupational History   Not on file  Tobacco Use   Smoking status: Never   Smokeless tobacco: Never  Substance and Sexual Activity   Alcohol use: No   Drug use: No   Sexual activity: Not on  file  Other Topics Concern   Not on file  Social History Narrative   Not on file   Social Determinants of Health   Financial Resource Strain: Low Risk    Difficulty of Paying Living Expenses: Not hard at all  Food Insecurity: No Food Insecurity   Worried About Running Out of Food in the Last Year: Never true   [place] in the Last Year: Never true  Transportation Needs: No Transportation Needs   Lack of Transportation (Medical): No   Lack of Transportation (Non-Medical): No  Physical Activity: Inactive   Days of Exercise per Week: 0 days   Minutes of Exercise per Session: 0 min  Stress: No Stress Concern Present   Feeling of Stress : Not at all  Social Connections: Moderately Integrated   Frequency of Communication with Friends and Family: More than three times a week   Frequency of Social Gatherings with Friends and Family: More than three times a week   Attends Religious Services: More than 4 times per year   Active Member of [employer] or Organizations: No   Attends Archivist Meetings: Never   Marital Status: Married  Human resources officer Violence: Not At Risk  Fear of Current or Ex-Partner: No   Emotionally Abused: No   Physically Abused: No   Sexually Abused: No      Review of Systems  All other systems reviewed and are negative.     Objective:   Physical Exam Vitals reviewed.  Constitutional:      Appearance: He is obese.  Cardiovascular:     Rate and Rhythm: Normal rate and regular rhythm.     Heart sounds: Normal heart sounds.  Pulmonary:     Effort: Pulmonary effort is normal. No respiratory distress.     Breath sounds: Normal breath sounds. No wheezing.  Abdominal:     General: Bowel sounds are normal. There is no distension.     Tenderness: There is no abdominal tenderness. There is no guarding.  Musculoskeletal:     Left lower leg: Edema present.       Legs:  Skin:    Findings: Erythema present.  Neurological:     Mental Status: He  is alert.          Assessment & Plan:  Left leg cellulitis Add Bactrim double strength tablets twice daily for 7 days and then recheck on Monday.  Monitor for any abscess in that area.  I believe it still residual cellulitis however.  Would likely benefit from a vein and vascular consult to see if there is anything to help prevent this in the future

## 2020-09-01 ENCOUNTER — Other Ambulatory Visit: Payer: Self-pay

## 2020-09-01 ENCOUNTER — Ambulatory Visit (INDEPENDENT_AMBULATORY_CARE_PROVIDER_SITE_OTHER): Payer: Medicare HMO | Admitting: Family Medicine

## 2020-09-01 VITALS — BP 122/80 | HR 78 | Temp 97.9°F | Resp 14 | Ht 71.0 in | Wt 321.0 lb

## 2020-09-01 DIAGNOSIS — L02416 Cutaneous abscess of left lower limb: Secondary | ICD-10-CM | POA: Diagnosis not present

## 2020-09-01 NOTE — Addendum Note (Signed)
Addended by: Sheral Flow on: 09/01/2020 02:42 PM   Modules accepted: Orders

## 2020-09-01 NOTE — Progress Notes (Signed)
Subjective:    Patient ID: Keith Watkins, male    DOB: 23-Jan-1945, 75 y.o.   MRN: [id number]  HPI 08/28/20 [name] to the ER 1 week ago with left leg swelling, redness, and pain.  Ultrasound was negative for DVT.  Was given Dalvance for cellulitis concerning for MRSA given his previous history.  Redness is improved all over the leg except for an area over the anterior lower shin.  There the area is warm hot and tender still.  Patient jumps with gentle palpation.  I do not appreciate any fluctuance.  It is firm but it is still red and warm to the touch.  At that time, my plan was: Add Bactrim double strength tablets twice daily for 7 days and then recheck on Monday.  Monitor for any abscess in that area.  I believe it still residual cellulitis however.  Would likely benefit from a vein and vascular consult to see if there is anything to help prevent this in the future  09/01/20 There is now a large abscess collection that is formed on the anterior lower shin just above the ankle.  The erythema on the lower leg has resolved everywhere except for the purulent collection of material on the lower ankle.  This is approximately 4 cm in diameter.  It is tense red firm and painful.  It is also bulging out proud of the surface of the skin.  I explained to the patient that this needs incision and drainage.  He has 3 options, he can go to the emergency room for imaging and potentially surgery consult, he could contact his orthopedist to see if they want him to come to their office, or we could do an incision and drainage here however this must be drained.  He was able to call his orthopedist however they were not able to work him in today due to scheduling conflicts.  Therefore he agrees to allow me to attempt incision and drainage here in the office. Past Medical History:  Diagnosis Date   Arthritis    Back pain    Cataract    Cellulitis 07/31/2011   Enlarged prostate    Foot abscess, left 08/07/2011   Hypertension     prior to gastric bypass- no meds taken   Lumbar spinal stenosis    Obesity 08/09/2011   Past Surgical History:  Procedure Laterality Date   APPENDECTOMY     arthroscopic knee surgery     CHOLECYSTECTOMY     I & D EXTREMITY  08/07/2011   Procedure: IRRIGATION AND DEBRIDEMENT EXTREMITY;  Surgeon: Wylene Simmer, MD;  Location: [facility];  Service: Orthopedics;  Laterality: Left;  irrigation and debridement left foot abscess   stomach stapled     Current Outpatient Medications on File Prior to Visit  Medication Sig Dispense Refill   mirabegron ER (MYRBETRIQ) 50 MG TB24 tablet Take 50 mg by mouth daily.     sulfamethoxazole-trimethoprim (BACTRIM DS) 800-160 MG tablet Take 1 tablet by mouth 2 (two) times daily. 20 tablet 0   tamsulosin (FLOMAX) 0.4 MG CAPS capsule Take 1 capsule (0.4 mg total) by mouth daily. 90 capsule 1   No current facility-administered medications on file prior to visit.   No Known Allergies Social History   Socioeconomic History   Marital status: Married    Spouse name: Not on file   Number of children: Not on file   Years of education: Not on file   Highest education level: Not on file  Occupational History   Not on file  Tobacco Use   Smoking status: Never   Smokeless tobacco: Never  Substance and Sexual Activity   Alcohol use: No   Drug use: No   Sexual activity: Not on file  Other Topics Concern   Not on file  Social History Narrative   Not on file   Social Determinants of Health   Financial Resource Strain: Low Risk    Difficulty of Paying Living Expenses: Not hard at all  Food Insecurity: No Food Insecurity   Worried About [place] in the Last Year: Never true   [place] in the Last Year: Never true  Transportation Needs: No Transportation Needs   Lack of Transportation (Medical): No   Lack of Transportation (Non-Medical): No  Physical Activity: Inactive   Days of Exercise per Week: 0 days   Minutes of Exercise per Session: 0  min  Stress: No Stress Concern Present   Feeling of Stress : Not at all  Social Connections: Moderately Integrated   Frequency of Communication with Friends and Family: More than three times a week   Frequency of Social Gatherings with Friends and Family: More than three times a week   Attends Religious Services: More than 4 times per year   Active Member of [employer] or Organizations: No   Attends Archivist Meetings: Never   Marital Status: Married  Human resources officer Violence: Not At Risk   Fear of Current or Ex-Partner: No   Emotionally Abused: No   Physically Abused: No   Sexually Abused: No      Review of Systems  All other systems reviewed and are negative.     Objective:   Physical Exam Vitals reviewed.  Constitutional:      Appearance: He is obese.  Cardiovascular:     Rate and Rhythm: Normal rate and regular rhythm.     Heart sounds: Normal heart sounds.  Pulmonary:     Effort: Pulmonary effort is normal. No respiratory distress.     Breath sounds: Normal breath sounds. No wheezing.  Abdominal:     General: Bowel sounds are normal. There is no distension.     Tenderness: There is no abdominal tenderness. There is no guarding.  Musculoskeletal:     Left lower leg: Swelling and tenderness present. Edema present.       Legs:  Skin:    Findings: Erythema present.  Neurological:     Mental Status: He is alert.          Assessment & Plan:  Abscess of left lower leg Skin was anesthetized with approximately 8 cc of 0.1% lidocaine with epinephrine.  A 1.5 cm vertical incision was made directly in the center of the fluid collection.  Copious purulent material and blood was expressed through vigorous palpation and pressure.  A wound culture was sent.  A pair of hemostats was then inserted into the wound.  It was probed around circumferentially and open and closed numerous times in every direction in an effort to break up any loculations.  The wound was then  probed with numerous Q-tips soaked in hydrogen peroxide.  Additional pus and purulent material was then expressed from the cavity.  This process was repeated 3 times until no additional drainage was able to be expressed from the wound.  I estimate that 20 cc of blood and pus were expressed from the wound.  The wound was then packed with 12 inches of 1/4  inch iodoform gauze.  Continue Bactrim and recheck tomorrow.  I will replace the packing.  Go to the emergency room if worsening.

## 2020-09-02 ENCOUNTER — Ambulatory Visit (INDEPENDENT_AMBULATORY_CARE_PROVIDER_SITE_OTHER): Payer: Medicare HMO | Admitting: Family Medicine

## 2020-09-02 VITALS — BP 128/84 | HR 68 | Temp 99.3°F | Resp 16 | Ht 71.0 in | Wt 320.0 lb

## 2020-09-02 DIAGNOSIS — L02416 Cutaneous abscess of left lower limb: Secondary | ICD-10-CM

## 2020-09-02 DIAGNOSIS — L03116 Cellulitis of left lower limb: Secondary | ICD-10-CM

## 2020-09-02 MED ORDER — CEPHALEXIN 500 MG PO CAPS: 500.0000 mg | ORAL_CAPSULE | Freq: Three times a day (TID) | ORAL | 0 refills | Status: DC

## 2020-09-02 NOTE — Progress Notes (Signed)
Subjective:    Patient ID: Keith Watkins, male    DOB: [birth year], 75 y.o.   MRN: [id number]  HPI 08/28/20 Keith Watkins to the ER 1 week ago with left leg swelling, redness, and pain.  Ultrasound was negative for DVT.  Was given Dalvance for cellulitis concerning for MRSA given his previous history.  Redness is improved all over the leg except for an area over the anterior lower shin.  There the area is warm hot and tender still.  Patient jumps with gentle palpation.  I do not appreciate any fluctuance.  It is firm but it is still red and warm to the touch.  At that time, my plan was: Add Bactrim double strength tablets twice daily for 7 days and then recheck on Monday.  Monitor for any abscess in that area.  I believe it still residual cellulitis however.  Would likely benefit from a vein and vascular consult to see if there is anything to help prevent this in the future  09/01/20 There is now a large abscess collection that is formed on the anterior lower shin just above the ankle.  The erythema on the lower leg has resolved everywhere except for the purulent collection of material on the lower ankle.  This is approximately 4 cm in diameter.  It is tense red firm and painful.  It is also bulging out proud of the surface of the skin.  I explained to the patient that this needs incision and drainage.  He has 3 options, he can go to the emergency room for imaging and potentially surgery consult, he could contact his orthopedist to see if they want him to come to their office, or we could do an incision and drainage here however this must be drained.  He was able to call his orthopedist however they were not able to work him in today due to scheduling conflicts.  Therefore he agrees to allow me to attempt incision and drainage here in the office.  At that time, my plan was: Skin was anesthetized with approximately 8 cc of 0.1% lidocaine with epinephrine.  A 1.5 cm vertical incision was made directly in the center of  the fluid collection.  Copious purulent material and blood was expressed through vigorous palpation and pressure.  A wound culture was sent.  A pair of hemostats was then inserted into the wound.  It was probed around circumferentially and open and closed numerous times in every direction in an effort to break up any loculations.  The wound was then probed with numerous Q-tips soaked in hydrogen peroxide.  Additional pus and purulent material was then expressed from the cavity.  This process was repeated 3 times until no additional drainage was able to be expressed from the wound.  I estimate that 20 cc of blood and pus were expressed from the wound.  The wound was then packed with 12 inches of 1/4 inch iodoform gauze.  Continue Bactrim and recheck tomorrow.  I will replace the packing.  Go to the emergency room if worsening.  09/02/20 I remove the packing from the wound.  The packing was soaked with blood however there was no pus or purulent material.  I was then able to express 5 to 6 cc of brownish blood which I believe is old blood.  It was maroon in color.  I had to numb the area with lidocaine to repack the wound.  I packed it again with 12 inches of 1/4 inch iodoform gauze.  There was fair amount of bleeding approximately 5 cc that came from the I&D site after anesthesia while packing.  This stopped after the packing was placed.  The wound was then covered with Neosporin and gauze and Coban. Past Medical History:  Diagnosis Date   Arthritis    Back pain    Cataract    Cellulitis 07/31/2011   Enlarged prostate    Foot abscess, left 08/07/2011   Hypertension    prior to gastric bypass- no meds taken   Lumbar spinal stenosis    Obesity 08/09/2011   Past Surgical History:  Procedure Laterality Date   APPENDECTOMY     arthroscopic knee surgery     CHOLECYSTECTOMY     I & D EXTREMITY  08/07/2011   Procedure: IRRIGATION AND DEBRIDEMENT EXTREMITY;  Surgeon: Wylene Simmer, MD;  Location: [facility];  Service:  Orthopedics;  Laterality: Left;  irrigation and debridement left foot abscess   stomach stapled     Current Outpatient Medications on File Prior to Visit  Medication Sig Dispense Refill   mirabegron ER (MYRBETRIQ) 50 MG TB24 tablet Take 50 mg by mouth daily.     sulfamethoxazole-trimethoprim (BACTRIM DS) 800-160 MG tablet Take 1 tablet by mouth 2 (two) times daily. 20 tablet 0   tamsulosin (FLOMAX) 0.4 MG CAPS capsule Take 1 capsule (0.4 mg total) by mouth daily. 90 capsule 1   No current facility-administered medications on file prior to visit.   No Known Allergies Social History   Socioeconomic History   Marital status: Married    Spouse name: Not on file   Number of children: Not on file   Years of education: Not on file   Highest education level: Not on file  Occupational History   Not on file  Tobacco Use   Smoking status: Never   Smokeless tobacco: Never  Substance and Sexual Activity   Alcohol use: No   Drug use: No   Sexual activity: Not on file  Other Topics Concern   Not on file  Social History Narrative   Not on file   Social Determinants of Health   Financial Resource Strain: Low Risk    Difficulty of Paying Living Expenses: Not hard at all  Food Insecurity: No Food Insecurity   Worried About Running Out of Food in the Last Year: Never true   [place] in the Last Year: Never true  Transportation Needs: No Transportation Needs   Lack of Transportation (Medical): No   Lack of Transportation (Non-Medical): No  Physical Activity: Inactive   Days of Exercise per Week: 0 days   Minutes of Exercise per Session: 0 min  Stress: No Stress Concern Present   Feeling of Stress : Not at all  Social Connections: Moderately Integrated   Frequency of Communication with Friends and Family: More than three times a week   Frequency of Social Gatherings with Friends and Family: More than three times a week   Attends Religious Services: More than 4 times per year    Active Member of [employer] or Organizations: No   Attends Archivist Meetings: Never   Marital Status: Married  Human resources officer Violence: Not At Risk   Fear of Current or Ex-Partner: No   Emotionally Abused: No   Physically Abused: No   Sexually Abused: No      Review of Systems  All other systems reviewed and are negative.     Objective:   Physical Exam Vitals reviewed.  Constitutional:      Appearance: He is obese.  Cardiovascular:     Rate and Rhythm: Normal rate and regular rhythm.     Heart sounds: Normal heart sounds.  Pulmonary:     Effort: Pulmonary effort is normal. No respiratory distress.     Breath sounds: Normal breath sounds. No wheezing.  Abdominal:     General: Bowel sounds are normal. There is no distension.     Tenderness: There is no abdominal tenderness. There is no guarding.  Musculoskeletal:     Left lower leg: Swelling and tenderness present. Edema present.       Legs:     Comments: Redness is fading.  Pain is better with palpation.  Patient is able to dorsiflex and plantarflex his foot without any significant pain leading me to believe that the infection is isolated to the subcutaneous tissue and not involving the muscle compartment  Skin:    Findings: Erythema present.  Neurological:     Mental Status: He is alert.          Assessment & Plan:  Abscess of left lower leg  Left leg cellulitis I will add Keflex 500 mg p.o. 3 times daily to the Bactrim.  I believe the Bactrim is doing a good job covering MRSA and potentially gram-negative's however I want to make sure that there is no resistance to gram-positive coverage.  Therefore I will add Keflex to cover skin flora more efficiently.  Await culture results.  Received the patient on Thursday to remove packing and replace with fresh packing.  We will repeat this process until the bleeding has subsided and then will allow the patient to start removing the packing gradually at home on his  own.  Recheck on Thursday.  Or go to the emergency room if worsening

## 2020-09-04 ENCOUNTER — Ambulatory Visit (INDEPENDENT_AMBULATORY_CARE_PROVIDER_SITE_OTHER): Payer: Medicare HMO | Admitting: Vascular Surgery

## 2020-09-04 ENCOUNTER — Encounter: Payer: Self-pay | Admitting: Vascular Surgery

## 2020-09-04 ENCOUNTER — Ambulatory Visit (INDEPENDENT_AMBULATORY_CARE_PROVIDER_SITE_OTHER): Payer: Medicare HMO | Admitting: Family Medicine

## 2020-09-04 ENCOUNTER — Encounter: Payer: Self-pay | Admitting: Family Medicine

## 2020-09-04 ENCOUNTER — Other Ambulatory Visit: Payer: Self-pay

## 2020-09-04 VITALS — BP 124/83 | HR 104 | Temp 97.6°F | Resp 18 | Ht 71.0 in | Wt 316.0 lb

## 2020-09-04 VITALS — BP 128/84 | HR 100 | Temp 98.1°F | Resp 16 | Ht 71.0 in | Wt 320.0 lb

## 2020-09-04 DIAGNOSIS — I872 Venous insufficiency (chronic) (peripheral): Secondary | ICD-10-CM

## 2020-09-04 DIAGNOSIS — L02416 Cutaneous abscess of left lower limb: Secondary | ICD-10-CM

## 2020-09-04 LAB — WOUND CULTURE
MICRO NUMBER:: 12304046
SPECIMEN QUALITY:: ADEQUATE

## 2020-09-04 NOTE — Progress Notes (Signed)
ASSESSMENT & PLAN   CHRONIC VENOUS INSUFFICIENCY: Based on his exam he does have evidence of chronic venous insufficiency which could be contributing to his issue in the left leg.  He had an abscess drained and now has a chronic wound that is being packed.  Based on my Doppler assessment he has multiphasic flow in the left foot and no evidence of arterial insufficiency.  We have discussed the importance of intermittent leg elevation and the proper positioning for this.  While he has the wound and cannot wear his compression stockings I recommended that he use Ace bandages for compression.  I have encouraged him to avoid prolonged sitting and standing.  We have discussed the importance of exercise specifically walking.  We also have discussed the importance of maintaining a healthy weight as central obesity especially increases lower extremity venous pressure.  I plan on seeing him back in 6 to 8 weeks and we can get formal venous reflux testing.  At some point we may need to consider formal arterial testing but currently with the wound on his ankle we would be unable to obtain ABIs.  He will continue his antibiotics.  I will see him back in 6 to 8 weeks.  He knows to call sooner if he has problems.  REASON FOR CONSULT:    Left lower extremity swelling.  The consult is requested by Dr. Berna Spare.  HPI:   Keith Watkins is a 75 y.o. male who presents for evaluation of left lower extremity swelling.  He has a complicated history.  He went to the emergency department about a week ago with left leg swelling redness and pain.  Ultrasound was negative for DVT.  He apparently has a history of MRSA and there was concern he might have a staph infection in the left leg.  He was given antibiotics.  He was seen today by Dr. Dennard Schaumann.  The area of concern was warm and tender.  He was started on Bactrim double strength for 7 days with plans to recheck him next week.  He sent for evaluation of his venous system.  Of  note it looks like he did have an abscess that was drained and a large amount of purulent material was expressed.  The wound was packed.  He is currently on Bactrim and Keflex both.  My history the patient denies any previous history of DVT.  He said no previous venous procedures.  In 2013 he dropped something on his foot and developed a infection and required long-term intravenous antibiotics.  Ultimately this healed.  He developed significant swelling of the left leg and went to the emergency department on 08/20/2020.  Duplex did not show any evidence of DVT.  He had an abscess on his anterior left leg which has been drained and is now being packed by Dr. Dennard Schaumann.   He works very long hours on his feet.  He says he works 12 to 14 hours a day 6 to 7 days a week.  He also does a lot of driving.  With respect to his nutrition as he is on the road all the time he states that he really has a terrible diet and is forced to live off of fast food which she knows is not ideal.  I do not get any history of claudication, rest pain.  He has no real risk factors for peripheral vascular disease.  He denies any history of diabetes, hypertension, hyperlipidemia, family history of premature cardiovascular disease,  or tobacco use.  He does have a history of morbid obesity is undergone previous stapling of his stomach.  Past Medical History:  Diagnosis Date   Arthritis    Back pain    Cataract    Cellulitis 07/31/2011   Enlarged prostate    Foot abscess, left 08/07/2011   Hypertension    prior to gastric bypass- no meds taken   Lumbar spinal stenosis    Obesity 08/09/2011    Family History  Problem Relation Age of Onset   Heart disease Mother    Cancer Sister        Breast   Breast cancer Sister    Hypertension Brother    Hypertension Maternal Grandmother    Stroke Maternal Grandmother    Heart disease Maternal Grandfather    Hypertension Maternal Grandfather    Diabetes Sister    Heart disease Sister     Hyperlipidemia Sister    Hypertension Sister    Stroke Sister    Diabetes Other    Stroke Other    Hypertension Other    Coronary artery disease Other    Heart failure Other    Colon cancer Neg Hx    Esophageal cancer Neg Hx    Stomach cancer Neg Hx    Rectal cancer Neg Hx     SOCIAL HISTORY: Social History   Tobacco Use   Smoking status: Never   Smokeless tobacco: Never  Substance Use Topics   Alcohol use: No    No Known Allergies  Current Outpatient Medications  Medication Sig Dispense Refill   cephALEXin (KEFLEX) 500 MG capsule Take 1 capsule (500 mg total) by mouth 3 (three) times daily. 21 capsule 0   mirabegron ER (MYRBETRIQ) 50 MG TB24 tablet Take 50 mg by mouth daily.     sulfamethoxazole-trimethoprim (BACTRIM DS) 800-160 MG tablet Take 1 tablet by mouth 2 (two) times daily. 20 tablet 0   tamsulosin (FLOMAX) 0.4 MG CAPS capsule Take 1 capsule (0.4 mg total) by mouth daily. 90 capsule 1   No current facility-administered medications for this visit.    REVIEW OF SYSTEMS:  '[X]'$  denotes positive finding, '[ ]'$  denotes negative finding Cardiac  Comments:  Chest pain or chest pressure:    Shortness of breath upon exertion:    Short of breath when lying flat:    Irregular heart rhythm:        Vascular    Pain in calf, thigh, or hip brought on by ambulation:    Pain in feet at night that wakes you up from your sleep:     Blood clot in your veins:    Leg swelling:  x       Pulmonary    Oxygen at home:    Productive cough:     Wheezing:         Neurologic    Sudden weakness in arms or legs:     Sudden numbness in arms or legs:     Sudden onset of difficulty speaking or slurred speech:    Temporary loss of vision in one eye:     Problems with dizziness:         Gastrointestinal    Blood in stool:     Vomited blood:         Genitourinary    Burning when urinating:     Blood in urine:        Psychiatric    Major depression:  Hematologic     Bleeding problems:    Problems with blood clotting too easily:        Skin    Rashes or ulcers: x       Constitutional    Fever or chills:    -  PHYSICAL EXAM:   Vitals:   09/04/20 1346  BP: 124/83  Pulse: (!) 104  Resp: 18  Temp: 97.6 F (36.4 C)  TempSrc: Temporal  SpO2: 95%  Weight: (!) 316 lb (143.3 kg)  Height: '5\' 11"'$  (1.803 m)   Body mass index is 44.07 kg/m. GENERAL: The patient is a well-nourished male, in no acute distress. The vital signs are documented above. CARDIAC: There is a regular rate and rhythm.  VASCULAR: I do not detect carotid bruits. I could not palpate pedal pulses because of his leg swelling.  However he has a biphasic dorsalis pedis and posterior tibial signal bilaterally. He has significant swelling bilaterally especially on the left with hyperpigmentation on the left.      I did look at the left great saphenous vein in the lower thigh and the vein did look dilated.  PULMONARY: There is good air exchange bilaterally without wheezing or rales. ABDOMEN: Soft and non-tender with normal pitched bowel sounds.  MUSCULOSKELETAL: There are no major deformities. NEUROLOGIC: No focal weakness or paresthesias are detected. SKIN: There are no ulcers or rashes noted. PSYCHIATRIC: The patient has a normal affect.  DATA:    Because of his wound we were unable to obtain any formal arterial or venous testing.  A total of 45 minutes was spent on this visit. 22 minutes was face to face time. More than 50% of the time was spent on counseling and coordinating with the patient.      Deitra Mayo [facility]

## 2020-09-04 NOTE — Progress Notes (Signed)
Subjective:    Patient ID: Keith Watkins, male    DOB: [birth year], 75 y.o.   MRN: [id number]  HPI 08/28/20 [name] to the ER 1 week ago with left leg swelling, redness, and pain.  Ultrasound was negative for DVT.  Was given Dalvance for cellulitis concerning for MRSA given his previous history.  Redness is improved all over the leg except for an area over the anterior lower shin.  There the area is warm hot and tender still.  Patient jumps with gentle palpation.  I do not appreciate any fluctuance.  It is firm but it is still red and warm to the touch.  At that time, my plan was: Add Bactrim double strength tablets twice daily for 7 days and then recheck on Monday.  Monitor for any abscess in that area.  I believe it still residual cellulitis however.  Would likely benefit from a vein and vascular consult to see if there is anything to help prevent this in the future  09/01/20 There is now a large abscess collection that is formed on the anterior lower shin just above the ankle.  The erythema on the lower leg has resolved everywhere except for the purulent collection of material on the lower ankle.  This is approximately 4 cm in diameter.  It is tense red firm and painful.  It is also bulging out proud of the surface of the skin.  I explained to the patient that this needs incision and drainage.  He has 3 options, he can go to the emergency room for imaging and potentially surgery consult, he could contact his orthopedist to see if they want him to come to their office, or we could do an incision and drainage here however this must be drained.  He was able to call his orthopedist however they were not able to work him in today due to scheduling conflicts.  Therefore he agrees to allow me to attempt incision and drainage here in the office.  At that time, my plan was: Skin was anesthetized with approximately 8 cc of 0.1% lidocaine with epinephrine.  A 1.5 cm vertical incision was made directly in the center of  the fluid collection.  Copious purulent material and blood was expressed through vigorous palpation and pressure.  A wound culture was sent.  A pair of hemostats was then inserted into the wound.  It was probed around circumferentially and open and closed numerous times in every direction in an effort to break up any loculations.  The wound was then probed with numerous Q-tips soaked in hydrogen peroxide.  Additional pus and purulent material was then expressed from the cavity.  This process was repeated 3 times until no additional drainage was able to be expressed from the wound.  I estimate that 20 cc of blood and pus were expressed from the wound.  The wound was then packed with 12 inches of 1/4 inch iodoform gauze.  Continue Bactrim and recheck tomorrow.  I will replace the packing.  Go to the emergency room if worsening.  09/02/20 I remove the packing from the wound.  The packing was soaked with blood however there was no pus or purulent material.  I was then able to express 5 to 6 cc of brownish blood which I believe is old blood.  It was maroon in color.  I had to numb the area with lidocaine to repack the wound.  I packed it again with 12 inches of 1/4 inch iodoform gauze.  There was fair amount of bleeding approximately 5 cc that came from the I&D site after anesthesia while packing.  This stopped after the packing was placed.  The wound was then covered with Neosporin and gauze and Coban.  At that time,my plan was:  will add Keflex 500 mg p.o. 3 times daily to the Bactrim.  I believe the Bactrim is doing a good job covering MRSA and potentially gram-negative's however I want to make sure that there is no resistance to gram-positive coverage.  Therefore I will add Keflex to cover skin flora more efficiently.  Await culture results.  Received the patient on Thursday to remove packing and replace with fresh packing.  We will repeat this process until the bleeding has subsided and then will allow the  patient to start removing the packing gradually at home on his own.  Recheck on Thursday.  Or go to the emergency room if worsening  09/04/20 Wound culture is not back yet however clinically the leg looks much much better.  The erythema has faded considerably over the abscess site.  There is now only a violaceous color but no warmth or erythema.  I removed all the packing and very little pus or blood was able to be expressed today.  I would estimate 1 to 2 cc of bloody drainage at best.  There was no significant pain to palpation and I was able to repack the patient's leg without having to use anesthesia as the swelling and pain had diminished so much. Past Medical History:  Diagnosis Date   Arthritis    Back pain    Cataract    Cellulitis 07/31/2011   Enlarged prostate    Foot abscess, left 08/07/2011   Hypertension    prior to gastric bypass- no meds taken   Lumbar spinal stenosis    Obesity 08/09/2011   Past Surgical History:  Procedure Laterality Date   APPENDECTOMY     arthroscopic knee surgery     CHOLECYSTECTOMY     I & D EXTREMITY  08/07/2011   Procedure: IRRIGATION AND DEBRIDEMENT EXTREMITY;  Surgeon: Wylene Simmer, MD;  Location: [facility];  Service: Orthopedics;  Laterality: Left;  irrigation and debridement left foot abscess   stomach stapled     Current Outpatient Medications on File Prior to Visit  Medication Sig Dispense Refill   cephALEXin (KEFLEX) 500 MG capsule Take 1 capsule (500 mg total) by mouth 3 (three) times daily. 21 capsule 0   mirabegron ER (MYRBETRIQ) 50 MG TB24 tablet Take 50 mg by mouth daily.     sulfamethoxazole-trimethoprim (BACTRIM DS) 800-160 MG tablet Take 1 tablet by mouth 2 (two) times daily. 20 tablet 0   tamsulosin (FLOMAX) 0.4 MG CAPS capsule Take 1 capsule (0.4 mg total) by mouth daily. 90 capsule 1   No current facility-administered medications on file prior to visit.   No Known Allergies Social History   Socioeconomic History   Marital status:  Married    Spouse name: Not on file   Number of children: Not on file   Years of education: Not on file   Highest education level: Not on file  Occupational History   Not on file  Tobacco Use   Smoking status: Never   Smokeless tobacco: Never  Substance and Sexual Activity   Alcohol use: No   Drug use: No   Sexual activity: Not on file  Other Topics Concern   Not on file  Social History Narrative   Not on  file   Social Determinants of Health   Financial Resource Strain: Low Risk    Difficulty of Paying Living Expenses: Not hard at all  Food Insecurity: No Food Insecurity   Worried About Charity fundraiser in the Last Year: Never true   Arboriculturist in the Last Year: Never true  Transportation Needs: No Transportation Needs   Lack of Transportation (Medical): No   Lack of Transportation (Non-Medical): No  Physical Activity: Inactive   Days of Exercise per Week: 0 days   Minutes of Exercise per Session: 0 min  Stress: No Stress Concern Present   Feeling of Stress : Not at all  Social Connections: Moderately Integrated   Frequency of Communication with Friends and Family: More than three times a week   Frequency of Social Gatherings with Friends and Family: More than three times a week   Attends Religious Services: More than 4 times per year   Active Member of [employer] or Organizations: No   Attends Archivist Meetings: Never   Marital Status: Married  Human resources officer Violence: Not At Risk   Fear of Current or Ex-Partner: No   Emotionally Abused: No   Physically Abused: No   Sexually Abused: No      Review of Systems  All other systems reviewed and are negative.     Objective:   Physical Exam Vitals reviewed.  Constitutional:      Appearance: He is obese.  Cardiovascular:     Rate and Rhythm: Normal rate and regular rhythm.     Heart sounds: Normal heart sounds.  Pulmonary:     Effort: Pulmonary effort is normal. No respiratory distress.      Breath sounds: Normal breath sounds. No wheezing.  Abdominal:     General: Bowel sounds are normal. There is no distension.     Tenderness: There is no abdominal tenderness. There is no guarding.  Musculoskeletal:     Left lower leg: Swelling and tenderness present. Edema present.       Legs:     Comments: Redness is fading.  Pain is better with palpation.  Patient is able to dorsiflex and plantarflex his foot without any significant pain leading me to believe that the infection is isolated to the subcutaneous tissue and not involving the muscle compartment  Skin:    Findings: Erythema present.  Neurological:     Mental Status: He is alert.          Assessment & Plan:  Abscess of left lower leg Patient appears 70% better.  I repacked the leg with 10 inches of 1/4 inch iodoform gauze.  I have asked the patient to remove 1 inch packing every day over the weekend and I will see him back on Tuesday.  Wound care as discussed.  Continue Bactrim and Keflex together until culture and sensitivity has returned however the patient is clearly improving.

## 2020-09-05 ENCOUNTER — Other Ambulatory Visit: Payer: Self-pay

## 2020-09-05 DIAGNOSIS — I872 Venous insufficiency (chronic) (peripheral): Secondary | ICD-10-CM

## 2020-09-09 ENCOUNTER — Ambulatory Visit (INDEPENDENT_AMBULATORY_CARE_PROVIDER_SITE_OTHER): Payer: Medicare HMO | Admitting: Family Medicine

## 2020-09-09 ENCOUNTER — Encounter: Payer: Self-pay | Admitting: Family Medicine

## 2020-09-09 ENCOUNTER — Other Ambulatory Visit: Payer: Self-pay

## 2020-09-09 VITALS — BP 128/66 | HR 60 | Temp 98.0°F | Resp 16 | Ht 71.0 in | Wt 317.0 lb

## 2020-09-09 DIAGNOSIS — L02416 Cutaneous abscess of left lower limb: Secondary | ICD-10-CM

## 2020-09-09 NOTE — Progress Notes (Signed)
Subjective:    Patient ID: Keith Watkins, male    DOB: 1945-04-10, 75 y.o.   MRN: [id number]  HPI 08/28/20 Keith Watkins to the ER 1 week ago with left leg swelling, redness, and pain.  Ultrasound was negative for DVT.  Was given Dalvance for cellulitis concerning for MRSA given his previous history.  Redness is improved all over the leg except for an area over the anterior lower shin.  There the area is warm hot and tender still.  Patient jumps with gentle palpation.  I do not appreciate any fluctuance.  It is firm but it is still red and warm to the touch.  At that time, my plan was: Add Bactrim double strength tablets twice daily for 7 days and then recheck on Monday.  Monitor for any abscess in that area.  I believe it still residual cellulitis however.  Would likely benefit from a vein and vascular consult to see if there is anything to help prevent this in the future  09/01/20 There is now a large abscess collection that is formed on the anterior lower shin just above the ankle.  The erythema on the lower leg has resolved everywhere except for the purulent collection of material on the lower ankle.  This is approximately 4 cm in diameter.  It is tense red firm and painful.  It is also bulging out proud of the surface of the skin.  I explained to the patient that this needs incision and drainage.  He has 3 options, he can go to the emergency room for imaging and potentially surgery consult, he could contact his orthopedist to see if they want him to come to their office, or we could do an incision and drainage here however this must be drained.  He was able to call his orthopedist however they were not able to work him in today due to scheduling conflicts.  Therefore he agrees to allow me to attempt incision and drainage here in the office.  At that time, my plan was: Skin was anesthetized with approximately 8 cc of 0.1% lidocaine with epinephrine.  A 1.5 cm vertical incision was made directly in the center of  the fluid collection.  Copious purulent material and blood was expressed through vigorous palpation and pressure.  A wound culture was sent.  A pair of hemostats was then inserted into the wound.  It was probed around circumferentially and open and closed numerous times in every direction in an effort to break up any loculations.  The wound was then probed with numerous Q-tips soaked in hydrogen peroxide.  Additional pus and purulent material was then expressed from the cavity.  This process was repeated 3 times until no additional drainage was able to be expressed from the wound.  I estimate that 20 cc of blood and pus were expressed from the wound.  The wound was then packed with 12 inches of 1/4 inch iodoform gauze.  Continue Bactrim and recheck tomorrow.  I will replace the packing.  Go to the emergency room if worsening.  09/02/20 I remove the packing from the wound.  The packing was soaked with blood however there was no pus or purulent material.  I was then able to express 5 to 6 cc of brownish blood which I believe is old blood.  It was maroon in color.  I had to numb the area with lidocaine to repack the wound.  I packed it again with 12 inches of 1/4 inch iodoform gauze.  There was fair amount of bleeding approximately 5 cc that came from the I&D site after anesthesia while packing.  This stopped after the packing was placed.  The wound was then covered with Neosporin and gauze and Coban.  At that time,my plan was:  will add Keflex 500 mg p.o. 3 times daily to the Bactrim.  I believe the Bactrim is doing a good job covering MRSA and potentially gram-negative's however I want to make sure that there is no resistance to gram-positive coverage.  Therefore I will add Keflex to cover skin flora more efficiently.  Await culture results.  Received the patient on Thursday to remove packing and replace with fresh packing.  We will repeat this process until the bleeding has subsided and then will allow the  patient to start removing the packing gradually at home on his own.  Recheck on Thursday.  Or go to the emergency room if worsening  09/04/20 Wound culture is not back yet however clinically the leg looks much much better.  The erythema has faded considerably over the abscess site.  There is now only a violaceous color but no warmth or erythema.  I removed all the packing and very little pus or blood was able to be expressed today.  I would estimate 1 to 2 cc of bloody drainage at best.  There was no significant pain to palpation and I was able to repack the patient's leg without having to use anesthesia as the swelling and pain had diminished so much.  At that time, my plan was: Patient appears 70% better.  I repacked the leg with 10 inches of 1/4 inch iodoform gauze.  I have asked the patient to remove 1 inch packing every day over the weekend and I will see him back on Tuesday.  Wound care as discussed.  Continue Bactrim and Keflex together until culture and sensitivity has returned however the patient is clearly improving.  09/09/20 Wound looks much better today.  I reviewed the entire packing.  A small less than 1 cc amount of bloody material was expressed from the cavity.  The skin around the incision and drainage site is now violaceous in color and is no longer hot or red.  It is no longer painful.  I replaced about 6 inches of 1/4 inch iodoform gauze into the wound.  Patient is almost completely done with his Keflex.  Wound culture revealed pansensitive staph aureus. Past Medical History:  Diagnosis Date   Arthritis    Back pain    Cataract    Cellulitis 07/31/2011   Enlarged prostate    Foot abscess, left 08/07/2011   Hypertension    prior to gastric bypass- no meds taken   Lumbar spinal stenosis    Obesity 08/09/2011   Past Surgical History:  Procedure Laterality Date   APPENDECTOMY     arthroscopic knee surgery     CHOLECYSTECTOMY     I & D EXTREMITY  08/07/2011   Procedure: IRRIGATION AND  DEBRIDEMENT EXTREMITY;  Surgeon: Wylene Simmer, MD;  Location: [facility];  Service: Orthopedics;  Laterality: Left;  irrigation and debridement left foot abscess   stomach stapled     Current Outpatient Medications on File Prior to Visit  Medication Sig Dispense Refill   cephALEXin (KEFLEX) 500 MG capsule Take 1 capsule (500 mg total) by mouth 3 (three) times daily. 21 capsule 0   mirabegron ER (MYRBETRIQ) 50 MG TB24 tablet Take 50 mg by mouth daily.  tamsulosin (FLOMAX) 0.4 MG CAPS capsule Take 1 capsule (0.4 mg total) by mouth daily. 90 capsule 1   No current facility-administered medications on file prior to visit.   No Known Allergies Social History   Socioeconomic History   Marital status: Married    Spouse name: Not on file   Number of children: Not on file   Years of education: Not on file   Highest education level: Not on file  Occupational History   Not on file  Tobacco Use   Smoking status: Never   Smokeless tobacco: Never  Substance and Sexual Activity   Alcohol use: No   Drug use: No   Sexual activity: Not on file  Other Topics Concern   Not on file  Social History Narrative   Not on file   Social Determinants of Health   Financial Resource Strain: Low Risk    Difficulty of Paying Living Expenses: Not hard at all  Food Insecurity: No Food Insecurity   Worried About Running Out of Food in the Last Year: Never true   [place] in the Last Year: Never true  Transportation Needs: No Transportation Needs   Lack of Transportation (Medical): No   Lack of Transportation (Non-Medical): No  Physical Activity: Inactive   Days of Exercise per Week: 0 days   Minutes of Exercise per Session: 0 min  Stress: No Stress Concern Present   Feeling of Stress : Not at all  Social Connections: Moderately Integrated   Frequency of Communication with Friends and Family: More than three times a week   Frequency of Social Gatherings with Friends and Family: More than three  times a week   Attends Religious Services: More than 4 times per year   Active Member of [employer] or Organizations: No   Attends Archivist Meetings: Never   Marital Status: Married  Human resources officer Violence: Not At Risk   Fear of Current or Ex-Partner: No   Emotionally Abused: No   Physically Abused: No   Sexually Abused: No      Review of Systems  All other systems reviewed and are negative.     Objective:   Physical Exam Vitals reviewed.  Constitutional:      Appearance: He is obese.  Cardiovascular:     Rate and Rhythm: Normal rate and regular rhythm.     Heart sounds: Normal heart sounds.  Pulmonary:     Effort: Pulmonary effort is normal. No respiratory distress.     Breath sounds: Normal breath sounds. No wheezing.  Abdominal:     General: Bowel sounds are normal. There is no distension.     Tenderness: There is no abdominal tenderness. There is no guarding.  Musculoskeletal:     Left lower leg: No swelling or tenderness. No edema.       Legs:     Comments: Redness is gone  Skin:    Findings: No erythema.  Neurological:     Mental Status: He is alert.          Assessment & Plan:  Abscess of left lower leg Replaced all the packing in the wound with 6 inches of 1/4 inch iodoform gauze.  Recommended they remove about 1 inch of gauze per day until all the packing is out.  Anticipate all the packing will be removed by Saturday.  Recommended the patient keep the leg elevated to avoid serous drainage until after the packing is removed for a few days.  Anticipate  that he can return to work early next week.  We will recheck the patient again on Monday

## 2020-09-16 ENCOUNTER — Other Ambulatory Visit: Payer: Self-pay

## 2020-09-16 ENCOUNTER — Encounter: Payer: Self-pay | Admitting: Family Medicine

## 2020-09-16 ENCOUNTER — Ambulatory Visit (INDEPENDENT_AMBULATORY_CARE_PROVIDER_SITE_OTHER): Payer: Medicare HMO | Admitting: Family Medicine

## 2020-09-16 VITALS — BP 118/84 | HR 100 | Ht 71.0 in | Wt 317.0 lb

## 2020-09-16 DIAGNOSIS — L02416 Cutaneous abscess of left lower limb: Secondary | ICD-10-CM | POA: Diagnosis not present

## 2020-09-16 NOTE — Progress Notes (Signed)
Subjective:    Patient ID: Keith Watkins, male    DOB: 16-Mar-1945, 74 y.o.   MRN: [id number]  HPI 08/28/20 Keith Watkins to the ER 1 week ago with left leg swelling, redness, and pain.  Ultrasound was negative for DVT.  Was given Dalvance for cellulitis concerning for MRSA given his previous history.  Redness is improved all over the leg except for an area over the anterior lower shin.  There the area is warm hot and tender still.  Patient jumps with gentle palpation.  I do not appreciate any fluctuance.  It is firm but it is still red and warm to the touch.  At that time, my plan was: Add Bactrim double strength tablets twice daily for 7 days and then recheck on Monday.  Monitor for any abscess in that area.  I believe it still residual cellulitis however.  Would likely benefit from a vein and vascular consult to see if there is anything to help prevent this in the future  09/01/20 There is now a large abscess collection that is formed on the anterior lower shin just above the ankle.  The erythema on the lower leg has resolved everywhere except for the purulent collection of material on the lower ankle.  This is approximately 4 cm in diameter.  It is tense red firm and painful.  It is also bulging out proud of the surface of the skin.  I explained to the patient that this needs incision and drainage.  He has 3 options, he can go to the emergency room for imaging and potentially surgery consult, he could contact his orthopedist to see if they want him to come to their office, or we could do an incision and drainage here however this must be drained.  He was able to call his orthopedist however they were not able to work him in today due to scheduling conflicts.  Therefore he agrees to allow me to attempt incision and drainage here in the office.  At that time, my plan was: Skin was anesthetized with approximately 8 cc of 0.1% lidocaine with epinephrine.  A 1.5 cm vertical incision was made directly in the center of  the fluid collection.  Copious purulent material and blood was expressed through vigorous palpation and pressure.  A wound culture was sent.  A pair of hemostats was then inserted into the wound.  It was probed around circumferentially and open and closed numerous times in every direction in an effort to break up any loculations.  The wound was then probed with numerous Q-tips soaked in hydrogen peroxide.  Additional pus and purulent material was then expressed from the cavity.  This process was repeated 3 times until no additional drainage was able to be expressed from the wound.  I estimate that 20 cc of blood and pus were expressed from the wound.  The wound was then packed with 12 inches of 1/4 inch iodoform gauze.  Continue Bactrim and recheck tomorrow.  I will replace the packing.  Go to the emergency room if worsening.  09/02/20 I remove the packing from the wound.  The packing was soaked with blood however there was no pus or purulent material.  I was then able to express 5 to 6 cc of brownish blood which I believe is old blood.  It was maroon in color.  I had to numb the area with lidocaine to repack the wound.  I packed it again with 12 inches of 1/4 inch iodoform gauze.  There was fair amount of bleeding approximately 5 cc that came from the I&D site after anesthesia while packing.  This stopped after the packing was placed.  The wound was then covered with Neosporin and gauze and Coban.  At that time,my plan was:  will add Keflex 500 mg p.o. 3 times daily to the Bactrim.  I believe the Bactrim is doing a good job covering MRSA and potentially gram-negative's however I want to make sure that there is no resistance to gram-positive coverage.  Therefore I will add Keflex to cover skin flora more efficiently.  Await culture results.  Received the patient on Thursday to remove packing and replace with fresh packing.  We will repeat this process until the bleeding has subsided and then will allow the  patient to start removing the packing gradually at home on his own.  Recheck on Thursday.  Or go to the emergency room if worsening  09/04/20 Wound culture is not back yet however clinically the leg looks much much better.  The erythema has faded considerably over the abscess site.  There is now only a violaceous color but no warmth or erythema.  I removed all the packing and very little pus or blood was able to be expressed today.  I would estimate 1 to 2 cc of bloody drainage at best.  There was no significant pain to palpation and I was able to repack the patient's leg without having to use anesthesia as the swelling and pain had diminished so much.  At that time, my plan was: Patient appears 70% better.  I repacked the leg with 10 inches of 1/4 inch iodoform gauze.  I have asked the patient to remove 1 inch packing every day over the weekend and I will see him back on Tuesday.  Wound care as discussed.  Continue Bactrim and Keflex together until culture and sensitivity has returned however the patient is clearly improving.  09/09/20 Wound looks much better today.  I reviewed the entire packing.  A small less than 1 cc amount of bloody material was expressed from the cavity.  The skin around the incision and drainage site is now violaceous in color and is no longer hot or red.  It is no longer painful.  I replaced about 6 inches of 1/4 inch iodoform gauze into the wound.  Patient is almost completely done with his Keflex.  Wound culture revealed pansensitive staph aureus.  At that time, my plan was: Replaced all the packing in the wound with 6 inches of 1/4 inch iodoform gauze.  Recommended they remove about 1 inch of gauze per day until all the packing is out.  Anticipate all the packing will be removed by Saturday.  Recommended the patient keep the leg elevated to avoid serous drainage until after the packing is removed for a few days.  Anticipate that he can return to work early next week.  We will recheck  the patient again on Monday  09/16/20 Patient's leg looks outstanding.  The wound has essentially closed to an area that is approximately 6 mm in diameter.  It appears to be 5 mm deep.  There is no drainage coming from the wound today.  There is no blood coming from the wound.  The surrounding skin is slightly purple in discoloration due to venous congestion but there is no erythema or warmth or fluctuance.  With firm palpation I am not able to express any pus or blood from the wound.  I anticipate  that the wound will close the secondary intention in a period of 3 to 7 days complicated only by the patient's venous insufficiency and leg swelling. Past Medical History:  Diagnosis Date   Arthritis    Back pain    Cataract    Cellulitis 07/31/2011   Enlarged prostate    Foot abscess, left 08/07/2011   Hypertension    prior to gastric bypass- no meds taken   Lumbar spinal stenosis    Obesity 08/09/2011   Past Surgical History:  Procedure Laterality Date   APPENDECTOMY     arthroscopic knee surgery     CHOLECYSTECTOMY     I & D EXTREMITY  08/07/2011   Procedure: IRRIGATION AND DEBRIDEMENT EXTREMITY;  Surgeon: Wylene Simmer, MD;  Location: [facility];  Service: Orthopedics;  Laterality: Left;  irrigation and debridement left foot abscess   stomach stapled     Current Outpatient Medications on File Prior to Visit  Medication Sig Dispense Refill   mirabegron ER (MYRBETRIQ) 50 MG TB24 tablet Take 50 mg by mouth daily.     tamsulosin (FLOMAX) 0.4 MG CAPS capsule Take 1 capsule (0.4 mg total) by mouth daily. 90 capsule 1   cephALEXin (KEFLEX) 500 MG capsule Take 1 capsule (500 mg total) by mouth 3 (three) times daily. 21 capsule 0   No current facility-administered medications on file prior to visit.   No Known Allergies Social History   Socioeconomic History   Marital status: Married    Spouse name: Not on file   Number of children: Not on file   Years of education: Not on file   Highest education  level: Not on file  Occupational History   Not on file  Tobacco Use   Smoking status: Never   Smokeless tobacco: Never  Substance and Sexual Activity   Alcohol use: No   Drug use: No   Sexual activity: Not on file  Other Topics Concern   Not on file  Social History Narrative   Not on file   Social Determinants of Health   Financial Resource Strain: Low Risk    Difficulty of Paying Living Expenses: Not hard at all  Food Insecurity: No Food Insecurity   Worried About Running Out of Food in the Last Year: Never true   [place] in the Last Year: Never true  Transportation Needs: No Transportation Needs   Lack of Transportation (Medical): No   Lack of Transportation (Non-Medical): No  Physical Activity: Inactive   Days of Exercise per Week: 0 days   Minutes of Exercise per Session: 0 min  Stress: No Stress Concern Present   Feeling of Stress : Not at all  Social Connections: Moderately Integrated   Frequency of Communication with Friends and Family: More than three times a week   Frequency of Social Gatherings with Friends and Family: More than three times a week   Attends Religious Services: More than 4 times per year   Active Member of [employer] or Organizations: No   Attends Archivist Meetings: Never   Marital Status: Married  Human resources officer Violence: Not At Risk   Fear of Current or Ex-Partner: No   Emotionally Abused: No   Physically Abused: No   Sexually Abused: No      Review of Systems  All other systems reviewed and are negative.     Objective:   Physical Exam Vitals reviewed.  Constitutional:      Appearance: He is obese.  Cardiovascular:  Rate and Rhythm: Normal rate and regular rhythm.     Heart sounds: Normal heart sounds.  Pulmonary:     Effort: Pulmonary effort is normal. No respiratory distress.     Breath sounds: Normal breath sounds. No wheezing.  Abdominal:     General: Bowel sounds are normal. There is no distension.      Tenderness: There is no abdominal tenderness. There is no guarding.  Musculoskeletal:     Left lower leg: No swelling or tenderness. No edema.       Legs:     Comments: Redness is gone  Skin:    Findings: No erythema.  Neurological:     Mental Status: He is alert.          Assessment & Plan:  Abscess of left lower leg The infection has completely resolved.  The family is doing an excellent job in wound care.  There is no secondary infection.  The wound should gradually closed by secondary intention over the next 3 to 7 days.  We discussed wound care strategies.  He is cleared to return to work next week.  Follow-up as needed

## 2020-09-18 DIAGNOSIS — N3941 Urge incontinence: Secondary | ICD-10-CM | POA: Diagnosis not present

## 2020-09-18 DIAGNOSIS — R3912 Poor urinary stream: Secondary | ICD-10-CM | POA: Diagnosis not present

## 2020-09-26 ENCOUNTER — Other Ambulatory Visit: Payer: Self-pay

## 2020-09-26 ENCOUNTER — Encounter: Payer: Self-pay | Admitting: Family Medicine

## 2020-09-26 ENCOUNTER — Ambulatory Visit (INDEPENDENT_AMBULATORY_CARE_PROVIDER_SITE_OTHER): Payer: Medicare HMO | Admitting: Family Medicine

## 2020-09-26 VITALS — BP 134/82 | HR 70 | Temp 98.9°F | Resp 16 | Ht 71.0 in | Wt 325.0 lb

## 2020-09-26 DIAGNOSIS — M7989 Other specified soft tissue disorders: Secondary | ICD-10-CM

## 2020-09-26 NOTE — Progress Notes (Signed)
Subjective:    Patient ID: Keith Watkins, male    DOB: 1945-12-18, 75 y.o.   MRN: [id number]  HPI 08/28/20 [name] to the ER 1 week ago with left leg swelling, redness, and pain.  Ultrasound was negative for DVT.  Was given Dalvance for cellulitis concerning for MRSA given his previous history.  Redness is improved all over the leg except for an area over the anterior lower shin.  There the area is warm hot and tender still.  Patient jumps with gentle palpation.  I do not appreciate any fluctuance.  It is firm but it is still red and warm to the touch.  At that time, my plan was: Add Bactrim double strength tablets twice daily for 7 days and then recheck on Monday.  Monitor for any abscess in that area.  I believe it still residual cellulitis however.  Would likely benefit from a vein and vascular consult to see if there is anything to help prevent this in the future  09/01/20 There is now a large abscess collection that is formed on the anterior lower shin just above the ankle.  The erythema on the lower leg has resolved everywhere except for the purulent collection of material on the lower ankle.  This is approximately 4 cm in diameter.  It is tense red firm and painful.  It is also bulging out proud of the surface of the skin.  I explained to the patient that this needs incision and drainage.  He has 3 options, he can go to the emergency room for imaging and potentially surgery consult, he could contact his orthopedist to see if they want him to come to their office, or we could do an incision and drainage here however this must be drained.  He was able to call his orthopedist however they were not able to work him in today due to scheduling conflicts.  Therefore he agrees to allow me to attempt incision and drainage here in the office.  At that time, my plan was: Skin was anesthetized with approximately 8 cc of 0.1% lidocaine with epinephrine.  A 1.5 cm vertical incision was made directly in the center of  the fluid collection.  Copious purulent material and blood was expressed through vigorous palpation and pressure.  A wound culture was sent.  A pair of hemostats was then inserted into the wound.  It was probed around circumferentially and open and closed numerous times in every direction in an effort to break up any loculations.  The wound was then probed with numerous Q-tips soaked in hydrogen peroxide.  Additional pus and purulent material was then expressed from the cavity.  This process was repeated 3 times until no additional drainage was able to be expressed from the wound.  I estimate that 20 cc of blood and pus were expressed from the wound.  The wound was then packed with 12 inches of 1/4 inch iodoform gauze.  Continue Bactrim and recheck tomorrow.  I will replace the packing.  Go to the emergency room if worsening.  09/02/20 I remove the packing from the wound.  The packing was soaked with blood however there was no pus or purulent material.  I was then able to express 5 to 6 cc of brownish blood which I believe is old blood.  It was maroon in color.  I had to numb the area with lidocaine to repack the wound.  I packed it again with 12 inches of 1/4 inch iodoform gauze.  There was fair amount of bleeding approximately 5 cc that came from the I&D site after anesthesia while packing.  This stopped after the packing was placed.  The wound was then covered with Neosporin and gauze and Coban.  At that time,my plan was:  will add Keflex 500 mg p.o. 3 times daily to the Bactrim.  I believe the Bactrim is doing a good job covering MRSA and potentially gram-negative's however I want to make sure that there is no resistance to gram-positive coverage.  Therefore I will add Keflex to cover skin flora more efficiently.  Await culture results.  Received the patient on Thursday to remove packing and replace with fresh packing.  We will repeat this process until the bleeding has subsided and then will allow the  patient to start removing the packing gradually at home on his own.  Recheck on Thursday.  Or go to the emergency room if worsening  09/04/20 Wound culture is not back yet however clinically the leg looks much much better.  The erythema has faded considerably over the abscess site.  There is now only a violaceous color but no warmth or erythema.  I removed all the packing and very little pus or blood was able to be expressed today.  I would estimate 1 to 2 cc of bloody drainage at best.  There was no significant pain to palpation and I was able to repack the patient's leg without having to use anesthesia as the swelling and pain had diminished so much.  At that time, my plan was: Patient appears 70% better.  I repacked the leg with 10 inches of 1/4 inch iodoform gauze.  I have asked the patient to remove 1 inch packing every day over the weekend and I will see him back on Tuesday.  Wound care as discussed.  Continue Bactrim and Keflex together until culture and sensitivity has returned however the patient is clearly improving.  09/09/20 Wound looks much better today.  I reviewed the entire packing.  A small less than 1 cc amount of bloody material was expressed from the cavity.  The skin around the incision and drainage site is now violaceous in color and is no longer hot or red.  It is no longer painful.  I replaced about 6 inches of 1/4 inch iodoform gauze into the wound.  Patient is almost completely done with his Keflex.  Wound culture revealed pansensitive staph aureus.  At that time, my plan was: Replaced all the packing in the wound with 6 inches of 1/4 inch iodoform gauze.  Recommended they remove about 1 inch of gauze per day until all the packing is out.  Anticipate all the packing will be removed by Saturday.  Recommended the patient keep the leg elevated to avoid serous drainage until after the packing is removed for a few days.  Anticipate that he can return to work early next week.  We will recheck  the patient again on Monday  09/16/20 Patient's leg looks outstanding.  The wound has essentially closed to an area that is approximately 6 mm in diameter.  It appears to be 5 mm deep.  There is no drainage coming from the wound today.  There is no blood coming from the wound.  The surrounding skin is slightly purple in discoloration due to venous congestion but there is no erythema or warmth or fluctuance.  With firm palpation I am not able to express any pus or blood from the wound.  I anticipate  that the wound will close the secondary intention in a period of 3 to 7 days complicated only by the patient's venous insufficiency and leg swelling.  At that time, my plan was:  The infection has completely resolved.  The family is doing an excellent job in wound care.  There is no secondary infection.  The wound should gradually close by secondary intention over the next 3 to 7 days.  We discussed wound care strategies.  He is cleared to return to work next week.  Follow-up as needed  09/26/20 Patient is concerned because he saw serous fluid weeping from a blister on his leg last evening.  The incision and drainage site has almost completely closed.  There is a linear opening about 1 mm wide and about 6 mm long that is very shallow 1 to 2 mm deep.  This is markedly improved from the last time I saw him.  There is no purulent drainage coming from this or erythema or warmth or pain.  There was a small vesicle that was very superficial that had ruptured about 2 inches above that did appear to be a small bulla that I  think occurred due to the adhesive from the bandage that they were placing over the wound.  That was weeping fluid last night.  Today it is irritated skin that looks like a ruptured blister.  There is no weeping edema.  There is no erythema or warmth or drainage Past Medical History:  Diagnosis Date   Arthritis    Back pain    Cataract    Cellulitis 07/31/2011   Enlarged prostate    Foot abscess,  left 08/07/2011   Hypertension    prior to gastric bypass- no meds taken   Lumbar spinal stenosis    Obesity 08/09/2011   Past Surgical History:  Procedure Laterality Date   APPENDECTOMY     arthroscopic knee surgery     CHOLECYSTECTOMY     I & D EXTREMITY  08/07/2011   Procedure: IRRIGATION AND DEBRIDEMENT EXTREMITY;  Surgeon: Wylene Simmer, MD;  Location: [facility];  Service: Orthopedics;  Laterality: Left;  irrigation and debridement left foot abscess   stomach stapled     Current Outpatient Medications on File Prior to Visit  Medication Sig Dispense Refill   cephALEXin (KEFLEX) 500 MG capsule Take 1 capsule (500 mg total) by mouth 3 (three) times daily. 21 capsule 0   mirabegron ER (MYRBETRIQ) 50 MG TB24 tablet Take 50 mg by mouth daily.     tamsulosin (FLOMAX) 0.4 MG CAPS capsule Take 1 capsule (0.4 mg total) by mouth daily. 90 capsule 1   No current facility-administered medications on file prior to visit.   No Known Allergies Social History   Socioeconomic History   Marital status: Married    Spouse name: Not on file   Number of children: Not on file   Years of education: Not on file   Highest education level: Not on file  Occupational History   Not on file  Tobacco Use   Smoking status: Never   Smokeless tobacco: Never  Substance and Sexual Activity   Alcohol use: No   Drug use: No   Sexual activity: Not on file  Other Topics Concern   Not on file  Social History Narrative   Not on file   Social Determinants of Health   Financial Resource Strain: Low Risk    Difficulty of Paying Living Expenses: Not hard at all  Food Insecurity: No  Food Insecurity   Worried About Charity fundraiser in the Last Year: Never true   Ran Out of Food in the Last Year: Never true  Transportation Needs: No Transportation Needs   Lack of Transportation (Medical): No   Lack of Transportation (Non-Medical): No  Physical Activity: Inactive   Days of Exercise per Week: 0 days   Minutes of  Exercise per Session: 0 min  Stress: No Stress Concern Present   Feeling of Stress : Not at all  Social Connections: Moderately Integrated   Frequency of Communication with Friends and Family: More than three times a week   Frequency of Social Gatherings with Friends and Family: More than three times a week   Attends Religious Services: More than 4 times per year   Active Member of [employer] or Organizations: No   Attends Archivist Meetings: Never   Marital Status: Married  Human resources officer Violence: Not At Risk   Fear of Current or Ex-Partner: No   Emotionally Abused: No   Physically Abused: No   Sexually Abused: No      Review of Systems  All other systems reviewed and are negative.     Objective:   Physical Exam Vitals reviewed.  Constitutional:      Appearance: He is obese.  Cardiovascular:     Rate and Rhythm: Normal rate and regular rhythm.     Heart sounds: Normal heart sounds.  Pulmonary:     Effort: Pulmonary effort is normal. No respiratory distress.     Breath sounds: Normal breath sounds. No wheezing.  Abdominal:     General: Bowel sounds are normal. There is no distension.     Tenderness: There is no abdominal tenderness. There is no guarding.  Musculoskeletal:     Left lower leg: No swelling or tenderness. No edema.       Legs:     Comments: Redness is gone  Skin:    Findings: No erythema.  Neurological:     Mental Status: He is alert.          Assessment & Plan:  Leg swelling Good news, no evidence of cellulitis or abscess.  I believe that the weeping fluid was due to the leg swelling and chronic venous insufficiency.  Recommended that he be compliant wearing his compression hose on a daily basis and try to keep his legs elevated is much as possible.  No interventions are necessary at this time.  I also encouraged him to stop using bandages over the small wound where I performed the incision and drainage because I feel that the adhesive is  likely irritating the skin

## 2020-11-12 ENCOUNTER — Ambulatory Visit (HOSPITAL_COMMUNITY)
Admission: RE | Admit: 2020-11-12 | Discharge: 2020-11-12 | Disposition: A | Discharge status: Home / Self Care | Payer: Medicare HMO | Source: Ambulatory Visit | Attending: Vascular Surgery | Admitting: Vascular Surgery

## 2020-11-12 ENCOUNTER — Encounter: Payer: Self-pay | Admitting: Vascular Surgery

## 2020-11-12 ENCOUNTER — Ambulatory Visit: Payer: Medicare HMO | Admitting: Vascular Surgery

## 2020-11-12 ENCOUNTER — Other Ambulatory Visit: Payer: Self-pay

## 2020-11-12 VITALS — BP 135/90 | HR 90 | Temp 98.4°F | Resp 18 | Ht 68.0 in | Wt 330.8 lb

## 2020-11-12 DIAGNOSIS — I872 Venous insufficiency (chronic) (peripheral): Secondary | ICD-10-CM

## 2020-11-12 DIAGNOSIS — I890 Lymphedema, not elsewhere classified: Secondary | ICD-10-CM

## 2020-11-12 NOTE — Progress Notes (Signed)
REASON FOR VISIT:   Follow-up of bilateral lower extremity swelling and left leg wound.  MEDICAL ISSUES:   LYMPHEDEMA: Given that the venous reflux test today was unremarkable I think the swelling is likely related to lymphedema.  Fortunately the wound on the left leg has healed.  He has multiphasic flow in both feet so I do not think he has significant arterial insufficiency.  We have again discussed the importance of intermittent leg elevation and the proper positioning for this.  He will continue to wear his compression stockings especially when he travels.  I encouraged him to exercise we have specifically discussed walking and water aerobics.  We have also discussed importance of maintaining healthy weight as central obesity especially increases lower extremity venous pressure.  He has a long flight to [place] state in January and his wife feels strongly about having him seen back prior to this trip given his significant problems with leg swelling.  With regards to the trip we have discussed the importance of trying to walk during the flight and also wear his compression stockings.  He needs to elevate his legs as soon as he gets to his hotel.  I think if the swelling progresses he would be a candidate for the BioTAB lymphedema pump.   HPI:   Keith Watkins is a pleasant 75 y.o. male who I saw on 09/04/2020 in consultation with left lower extremity swelling.  He had been to the emergency department in August with left leg swelling, redness, and pain.  His ultrasound was negative for a DVT.  There was however a concern that he might have a staph infection in his leg.  He was treated with antibiotics.  In reviewing the records it sounds like he had an abscess that was drained and a large amount of purulent material was expressed.  The wound was packed.  He had evidence of significant chronic venous insufficiency based on his exam which certainly could be contributing to the issues with his left  leg.  He he had a chronic wound on the left leg which was being packed.  Based on my assessment with the Doppler he had multiphasic flow in the left foot with no evidence of significant arterial insufficiency.  We discussed the importance of leg elevation and the proper positioning for this.  I recommended that he use an Ace bandage to provide some mild compression in addition to his dressing changes.  We discussed the importance of exercise and maintaining a healthy weight as it relates to venous disease.  He comes in for a 6 to 8-week follow-up visit.  My plan was to get formal venous reflux testing at that time.  Since I saw him last, the wound on the left leg has completely healed.  At this point his only complaint is bilateral lower extremity swelling which is more significant on the left side.  He has no significant pain.  Of note he sits quite a bit at work and this is certainly contributing to his leg swelling.  He also has a travel quite a bit.  This oftentimes involves long flights.  Past Medical History:  Diagnosis Date   Arthritis    Back pain    Cataract    Cellulitis 07/31/2011   Enlarged prostate    Foot abscess, left 08/07/2011   Hypertension    prior to gastric bypass- no meds taken   Lumbar spinal stenosis    Obesity 08/09/2011    Family History  Problem Relation Age of Onset   Heart disease Mother    Cancer Sister        Breast   Breast cancer Sister    Hypertension Brother    Hypertension Maternal Grandmother    Stroke Maternal Grandmother    Heart disease Maternal Grandfather    Hypertension Maternal Grandfather    Diabetes Sister    Heart disease Sister    Hyperlipidemia Sister    Hypertension Sister    Stroke Sister    Diabetes Other    Stroke Other    Hypertension Other    Coronary artery disease Other    Heart failure Other    Colon cancer Neg Hx    Esophageal cancer Neg Hx    Stomach cancer Neg Hx    Rectal cancer Neg Hx     SOCIAL HISTORY: Social  History   Tobacco Use   Smoking status: Never   Smokeless tobacco: Never  Substance Use Topics   Alcohol use: No    No Known Allergies  Current Outpatient Medications  Medication Sig Dispense Refill   mirabegron ER (MYRBETRIQ) 50 MG TB24 tablet Take 50 mg by mouth daily.     tamsulosin (FLOMAX) 0.4 MG CAPS capsule Take 1 capsule (0.4 mg total) by mouth daily. 90 capsule 1   No current facility-administered medications for this visit.    REVIEW OF SYSTEMS:  [X]  denotes positive finding, [ ]  denotes negative finding Cardiac  Comments:  Chest pain or chest pressure:    Shortness of breath upon exertion:    Short of breath when lying flat:    Irregular heart rhythm:        Vascular    Pain in calf, thigh, or hip brought on by ambulation:    Pain in feet at night that wakes you up from your sleep:     Blood clot in your veins:    Leg swelling:  x       Pulmonary    Oxygen at home:    Productive cough:     Wheezing:         Neurologic    Sudden weakness in arms or legs:     Sudden numbness in arms or legs:     Sudden onset of difficulty speaking or slurred speech:    Temporary loss of vision in one eye:     Problems with dizziness:         Gastrointestinal    Blood in stool:     Vomited blood:         Genitourinary    Burning when urinating:     Blood in urine:        Psychiatric    Major depression:         Hematologic    Bleeding problems:    Problems with blood clotting too easily:        Skin    Rashes or ulcers:        Constitutional    Fever or chills:     PHYSICAL EXAM:   Vitals:   11/12/20 1451  BP: 135/90  Pulse: 90  Resp: 18  Temp: 98.4 F (36.9 C)  TempSrc: Temporal  SpO2: 96%  Weight: (!) 330 lb 12.8 oz (150 kg)  Height: 5\' 8"  (1.727 m)    GENERAL: The patient is a well-nourished male, in no acute distress. The vital signs are documented above. CARDIAC: There is a regular rate and rhythm.  VASCULAR: I  do not detect carotid  bruits. I cannot palpate pedal pulses having both feet are warm and well-perfused. He has bilateral lower extremity swelling which is nonpitting.  This is more significant on the left side.     PULMONARY: There is good air exchange bilaterally without wheezing or rales. MUSCULOSKELETAL: There are no major deformities or cyanosis. NEUROLOGIC: No focal weakness or paresthesias are detected. SKIN: There are no ulcers or rashes noted. PSYCHIATRIC: The patient has a normal affect.  DATA:    VENOUS DUPLEX: I have independently interpreted his venous duplex scan.  This was of the left lower extremity only.  There is no evidence of DVT.  There was no significant deep venous reflux.  There was no significant superficial venous reflux.  Deitra [facility] [phone]

## 2020-11-22 ENCOUNTER — Other Ambulatory Visit: Payer: Self-pay | Admitting: Family Medicine

## 2020-11-22 DIAGNOSIS — N3941 Urge incontinence: Secondary | ICD-10-CM

## 2020-11-22 DIAGNOSIS — N401 Enlarged prostate with lower urinary tract symptoms: Secondary | ICD-10-CM

## 2020-12-18 DIAGNOSIS — N3941 Urge incontinence: Secondary | ICD-10-CM | POA: Diagnosis not present

## 2020-12-18 DIAGNOSIS — R350 Frequency of micturition: Secondary | ICD-10-CM | POA: Diagnosis not present

## 2020-12-24 ENCOUNTER — Ambulatory Visit: Payer: Medicare HMO | Admitting: Vascular Surgery

## 2021-01-19 DIAGNOSIS — M79642 Pain in left hand: Secondary | ICD-10-CM | POA: Diagnosis not present

## 2021-01-19 DIAGNOSIS — S66912A Strain of unspecified muscle, fascia and tendon at wrist and hand level, left hand, initial encounter: Secondary | ICD-10-CM | POA: Diagnosis not present

## 2021-01-19 DIAGNOSIS — M19042 Primary osteoarthritis, left hand: Secondary | ICD-10-CM | POA: Diagnosis not present

## 2021-02-18 DIAGNOSIS — N3941 Urge incontinence: Secondary | ICD-10-CM | POA: Diagnosis not present

## 2021-02-18 DIAGNOSIS — R350 Frequency of micturition: Secondary | ICD-10-CM | POA: Diagnosis not present

## 2021-03-15 IMAGING — MR MRI LUMBAR SPINE WITHOUT CONTRAST
4 of 5 series · 25 of 48 positions shown · non-contrast
Comparison: None.

CLINICAL DATA: Low back pain radiating to the left hip and leg.
Fall on July 15, 2018.

EXAM:
MRI LUMBAR SPINE WITHOUT CONTRAST
TECHNIQUE: Multiplanar, multisequence MR imaging of the lumbar spine was
performed. No intravenous contrast was administered.

[Series 3: T2 post-contrast · sagittal · 4.0mm · 0.55mm/px · 6 of 16 slices shown]
[im 1/16]
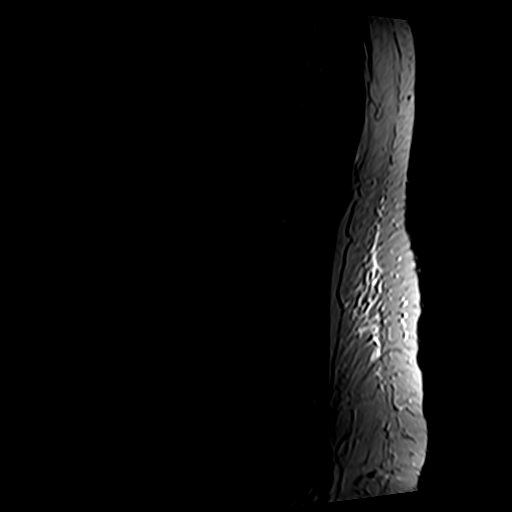
[im 4/16]
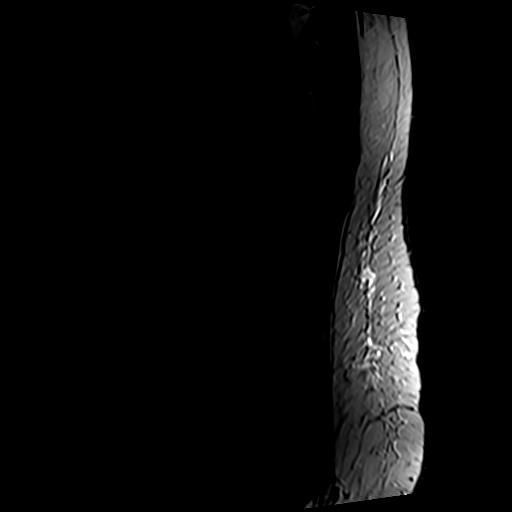
[im 7/16]
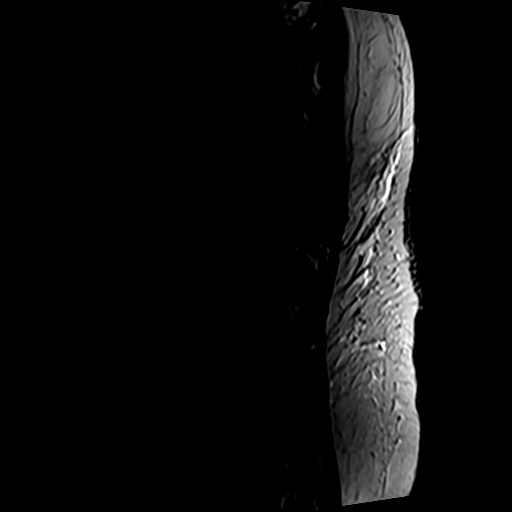
[im 10/16]
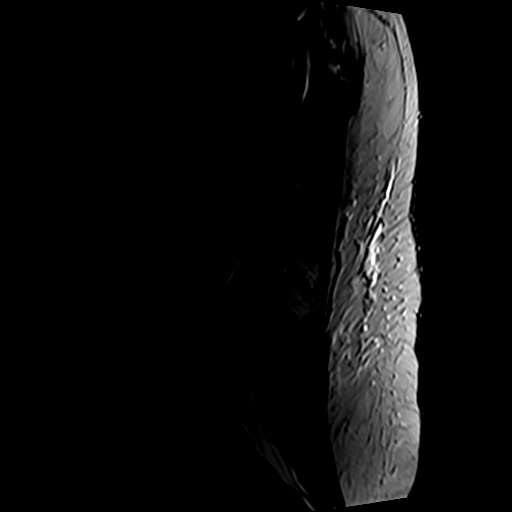
[im 13/16]
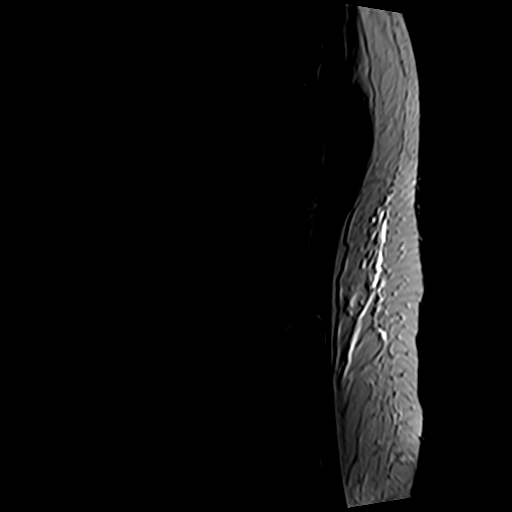
[im 16/16]
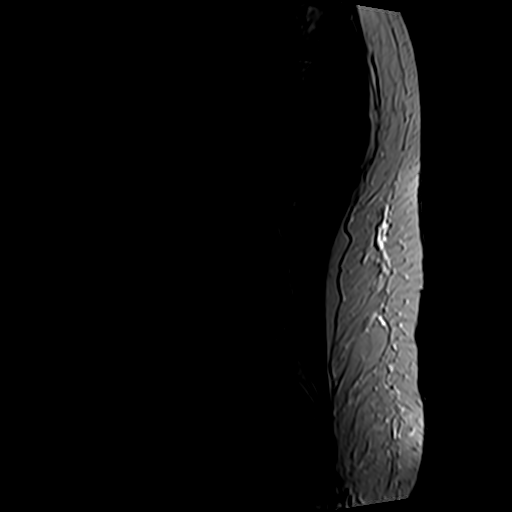

[Series 5: T1 · sagittal · 4.0mm · 0.55mm/px · 7 of 16 slices shown (1 of 2)]
[im 1/16]
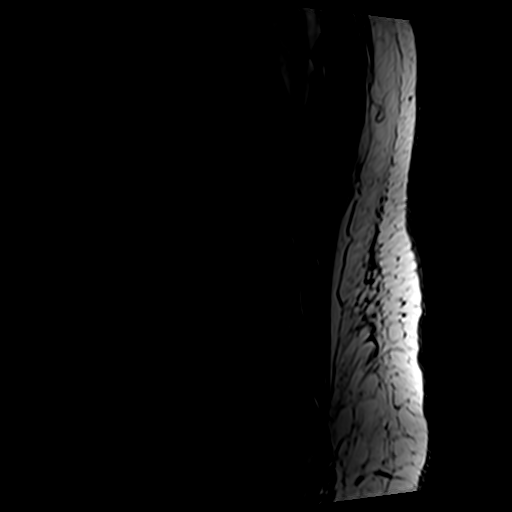
[im 3/16]
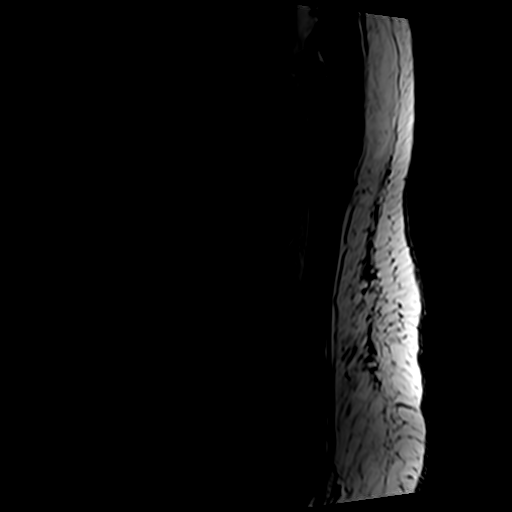
[im 6/16]
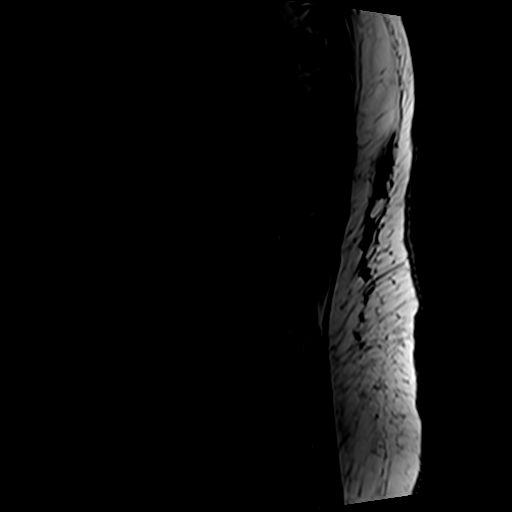
[im 8/16]
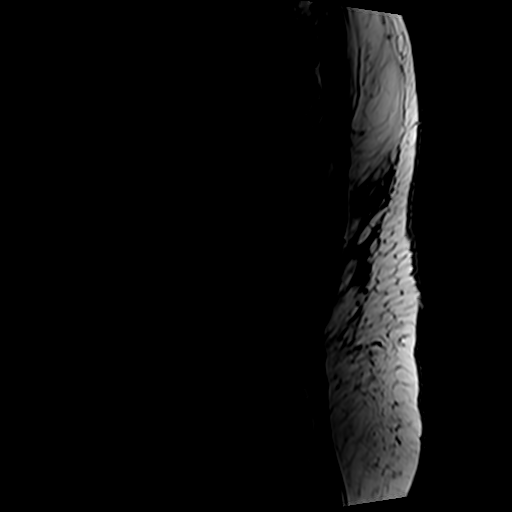
[im 11/16]
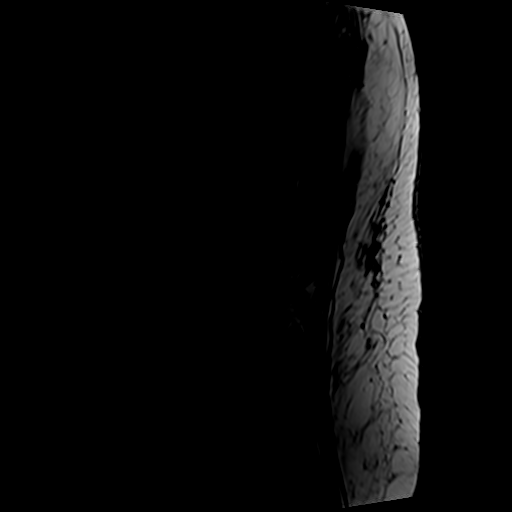
[im 13/16]
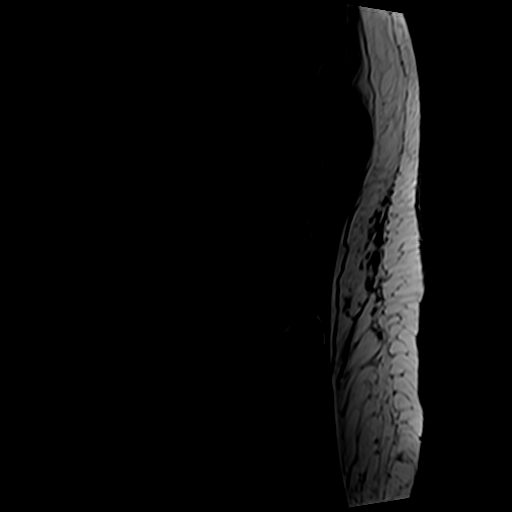
[im 16/16]
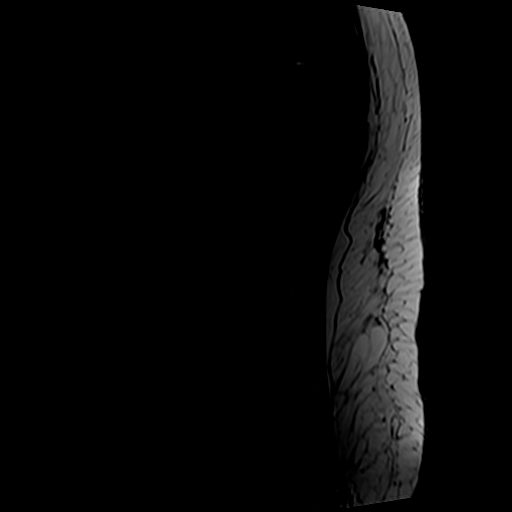

[Series 6: T1 · axial · 4.0mm · 0.35mm/px · z∈[-49,+106]mm · 4 of 35 slices shown (2 of 2)]
[im 1/35]
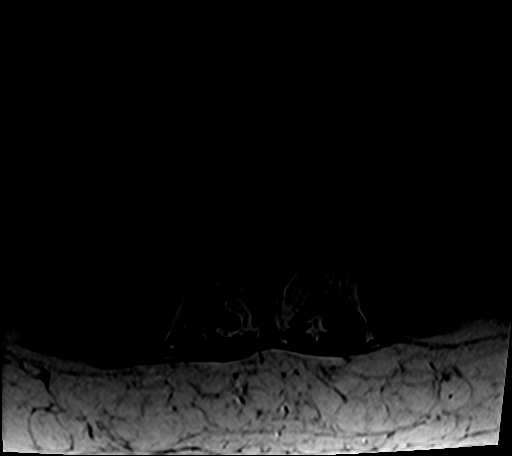
[im 6/35]
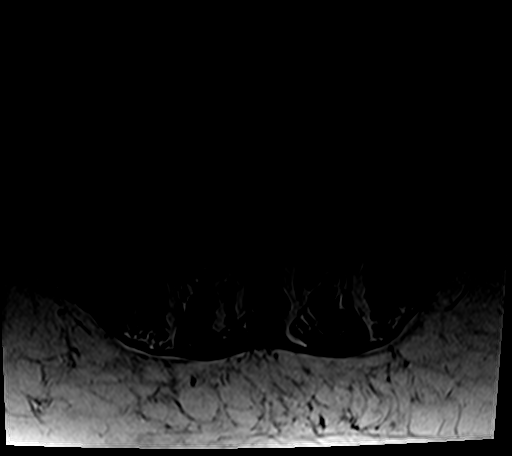
[im 19/35]
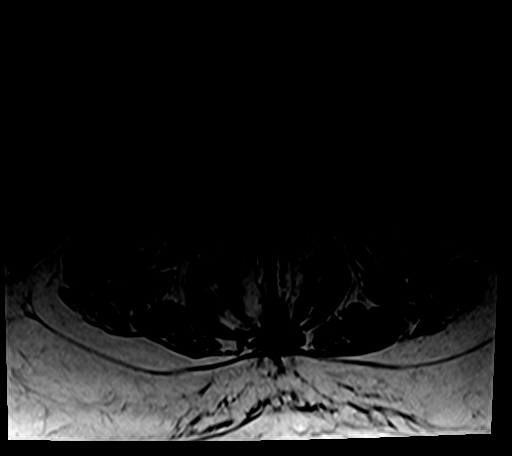
[im 29/35]
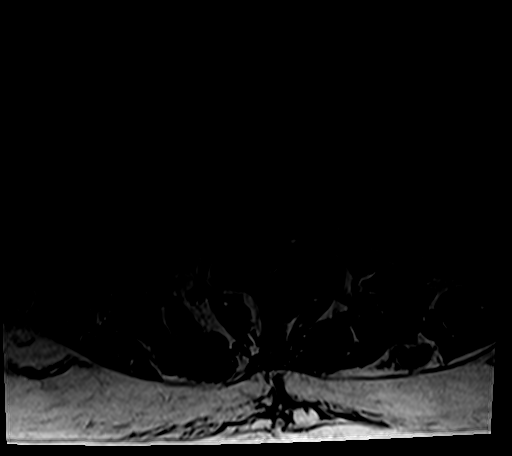

[Series 7: T2 · axial · 4.0mm · 0.70mm/px · z∈[-49,+151]mm · 8 of 35 slices shown]
[im 1/35]
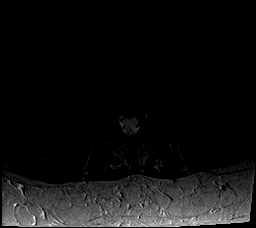
[im 6/35]
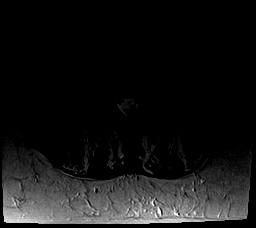
[im 11/35]
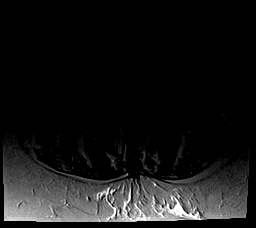
[im 16/35]
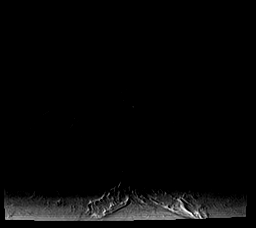
[im 19/35]
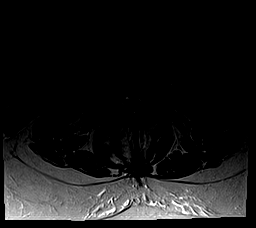
[im 24/35]
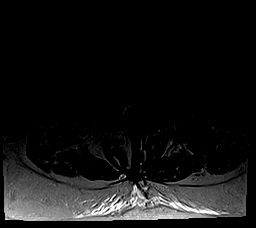
[im 29/35]
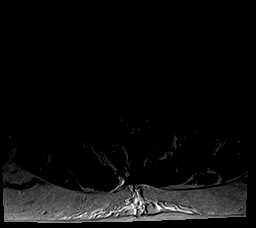
[im 35/35]
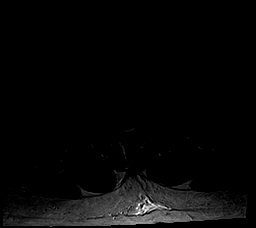

[25 of 48 positions shown; findings below may reference images not displayed]

FINDINGS: Segmentation:  Normal

Alignment: Grade 1 anterolisthesis at L4-5 due to severe facet
arthrosis.

Vertebrae: No acute compression fracture, discitis-osteomyelitis,
facet edema or other focal marrow lesion. No epidural collection.

Conus medullaris and cauda equina: Conus extends to the L2 level.
Conus and cauda equina appear normal.

Paraspinal and other soft tissues: Negative

Disc levels:

T10-11 and T11-12 are imaged only in the sagittal plane. There is
mild bilateral T11 neural foraminal narrowing.

T12-L1: Mild disc bulge without spinal canal stenosis. No neural
foraminal stenosis.

L1-2: Disc space narrowing and minimal bulge. No spinal canal
stenosis. Mild narrowing of both neural foramina.

L2-3: Severe disc height loss with minimal bulge. No spinal canal
stenosis. Moderate bilateral neural foraminal stenosis.

L3-4: Disc space narrowing and mild bulge with mild facet
hypertrophy. Mild spinal canal stenosis. Severe bilateral neural
foraminal stenosis.

L4-5: Severe facet hypertrophy and intermediate sized right
eccentric disc bulge. Severe spinal canal stenosis. Moderate
bilateral neural foraminal stenosis.

L5-S1: Mild disc bulge, left eccentric. Moderate facet hypertrophy.
No spinal canal stenosis. Mild left foraminal stenosis.
IMPRESSION: 1. L4-5 severe spinal canal stenosis with moderate bilateral neural
foraminal stenosis.
2. L3-4 severe bilateral neural foraminal stenosis.
3. L2-3 moderate bilateral neural foraminal stenosis.

## 2021-05-29 ENCOUNTER — Other Ambulatory Visit: Payer: Self-pay | Admitting: Family Medicine

## 2021-05-29 DIAGNOSIS — N3941 Urge incontinence: Secondary | ICD-10-CM

## 2021-05-29 DIAGNOSIS — N401 Enlarged prostate with lower urinary tract symptoms: Secondary | ICD-10-CM

## 2021-06-02 NOTE — Telephone Encounter (Signed)
Requested medication (s) are due for refill today - yes  Requested medication (s) are on the active medication list -yes  Future visit scheduled -no  Last refill: 11/24/20 #90 1RF  Notes to clinic: [facility] lab protocol- over 1 year-2021  Requested Prescriptions  Pending Prescriptions Disp Refills   tamsulosin (FLOMAX) 0.4 MG CAPS capsule [Pharmacy Med Name: TAMSULOSIN HCL 0.4 MG CAPSULE] 90 capsule 1    Sig: TAKE 1 [place]     Urology: Alpha-Adrenergic Blocker Failed - 05/29/2021  1:54 AM      Failed - PSA in normal range and within 360 days    PSA  Date Value Ref Range Status  07/24/2019 0.3 < OR = 4.0 ng/mL Final    Comment:    The total PSA value from this assay system is  standardized against the WHO standard. The test  result will be approximately 20% lower when compared  to the equimolar-standardized total PSA (Beckman  Coulter). Comparison of serial PSA results should be  interpreted with this fact in mind. . This test was performed using the Siemens  chemiluminescent method. Values obtained from  different assay methods cannot be used interchangeably. PSA levels, regardless of value, should not be interpreted as absolute evidence of the presence or absence of disease.          Failed - Last BP in normal range    BP Readings from Last 1 Encounters:  11/12/20 135/90         Passed - Valid encounter within last 12 months    Recent Outpatient Visits           8 months ago Leg swelling   [place] Susy Frizzle, MD   8 months ago Abscess of left lower leg   [place] Susy Frizzle, MD   8 months ago Abscess of left lower leg   [place] Susy Frizzle, MD   9 months ago Abscess of left lower leg   [place] Susy Frizzle, MD   9 months ago Abscess of left lower leg   [place] Pickard, Cammie Mcgee, MD                   Requested Prescriptions  Pending Prescriptions Disp Refills   tamsulosin (FLOMAX) 0.4 MG CAPS capsule [Pharmacy Med Name: TAMSULOSIN HCL 0.4 MG CAPSULE] 90 capsule 1    Sig: TAKE 1 CAPSULE BY MOUTH EVERY DAY     Urology: Alpha-Adrenergic Blocker Failed - 05/29/2021  1:54 AM      Failed - PSA in normal range and within 360 days    PSA  Date Value Ref Range Status  07/24/2019 0.3 < OR = 4.0 ng/mL Final    Comment:    The total PSA value from this assay system is  standardized against the WHO standard. The test  result will be approximately 20% lower when compared  to the equimolar-standardized total PSA (Beckman  Coulter). Comparison of serial PSA results should be  interpreted with this fact in mind. . This test was performed using the Siemens  chemiluminescent method. Values obtained from  different assay methods cannot be used interchangeably. PSA levels, regardless of value, should not be interpreted as absolute evidence of the presence or absence of disease.          Failed - Last BP in normal range    BP Readings from  Last 1 Encounters:  11/12/20 135/90         Passed - Valid encounter within last 12 months    Recent Outpatient Visits           8 months ago Leg swelling   [place] Susy Frizzle, MD   8 months ago Abscess of left lower leg   [place] Susy Frizzle, MD   8 months ago Abscess of left lower leg   [place] Susy Frizzle, MD   9 months ago Abscess of left lower leg   [place] Susy Frizzle, MD   9 months ago Abscess of left lower leg   [place] Pickard, Cammie Mcgee, MD

## 2021-06-04 ENCOUNTER — Telehealth: Payer: Self-pay | Admitting: Family Medicine

## 2021-06-04 NOTE — Telephone Encounter (Signed)
Left message for patient to call back and schedule Medicare Annual Wellness Visit (AWV) in office.   If not able to come in office, please offer to do virtually or by telephone.  Left office number and my jabber [phone].  Last AWV:05/30/2020  Please schedule at anytime with Nurse Health Advisor.

## 2021-07-01 ENCOUNTER — Other Ambulatory Visit: Payer: Self-pay | Admitting: Family Medicine

## 2021-07-01 DIAGNOSIS — N401 Enlarged prostate with lower urinary tract symptoms: Secondary | ICD-10-CM

## 2021-07-01 NOTE — Patient Instructions (Incomplete)
Keith Watkins , Thank you for taking time to come for your Medicare Wellness Visit. I appreciate your ongoing commitment to your health goals. Please review the following plan we discussed and let me know if I can assist you in the future.   Screening recommendations/referrals: Colonoscopy: Done 03/08/2014 Repeat in 10 years  Recommended yearly ophthalmology/optometry visit for glaucoma screening and checkup Recommended yearly dental visit for hygiene and checkup  Vaccinations: Influenza vaccine: Due Fall 2024. Pneumococcal vaccine: Done 02/06/2013 and 08/09/2011. Tdap vaccine: Due every 10 years. Shingles vaccine: Available at your local pharmacy.   Covid-19: Done 02/17/2019 and 03/14/2019.  Advanced directives: Please bring a copy of your health care power of attorney and living will to the office to be added to your chart at your convenience.   Conditions/risks identified: Aim for 30 minutes of exercise or brisk walking, 6-8 glasses of water, and 5 servings of fruits and vegetables each day.   Next appointment: Follow up in one year for your annual wellness visit. 2024.  Preventive Care 6 Years and Older, Male  Preventive care refers to lifestyle choices and visits with your health care provider that can promote health and wellness. What does preventive care include? A yearly physical exam. This is also called an annual well check. Dental exams once or twice a year. Routine eye exams. Ask your health care provider how often you should have your eyes checked. Personal lifestyle choices, including: Daily care of your teeth and gums. Regular physical activity. Eating a healthy diet. Avoiding tobacco and drug use. Limiting alcohol use. Practicing safe sex. Taking low doses of aspirin every day. Taking vitamin and mineral supplements as recommended by your health care provider. What happens during an annual well check? The services and screenings done by your health care provider during  your annual well check will depend on your age, overall health, lifestyle risk factors, and family history of disease. Counseling  Your health care provider may ask you questions about your: Alcohol use. Tobacco use. Drug use. Emotional well-being. Home and relationship well-being. Sexual activity. Eating habits. History of falls. Memory and ability to understand (cognition). Work and work Statistician. Screening  You may have the following tests or measurements: Height, weight, and BMI. Blood pressure. Lipid and cholesterol levels. These may be checked every 5 years, or more frequently if you are over 7 years old. Skin check. Lung cancer screening. You may have this screening every year starting at age 73 if you have a 30-pack-year history of smoking and currently smoke or have quit within the past 15 years. Fecal occult blood test (FOBT) of the stool. You may have this test every year starting at age 79. Flexible sigmoidoscopy or colonoscopy. You may have a sigmoidoscopy every 5 years or a colonoscopy every 10 years starting at age 21. Prostate cancer screening. Recommendations will vary depending on your family history and other risks. Hepatitis C blood test. Hepatitis B blood test. Sexually transmitted disease (STD) testing. Diabetes screening. This is done by checking your blood sugar (glucose) after you have not eaten for a while (fasting). You may have this done every 1-3 years. Abdominal aortic aneurysm (AAA) screening. You may need this if you are a current or former smoker. Osteoporosis. You may be screened starting at age 37 if you are at high risk. Talk with your health care provider about your test results, treatment options, and if necessary, the need for more tests. Vaccines  Your health care provider may recommend certain vaccines,  such as: Influenza vaccine. This is recommended every year. Tetanus, diphtheria, and acellular pertussis (Tdap, Td) vaccine. You may need  a Td booster every 10 years. Zoster vaccine. You may need this after age 46. Pneumococcal 13-valent conjugate (PCV13) vaccine. One dose is recommended after age 85. Pneumococcal polysaccharide (PPSV23) vaccine. One dose is recommended after age 48. Talk to your health care provider about which screenings and vaccines you need and how often you need them. This information is not intended to replace advice given to you by your health care provider. Make sure you discuss any questions you have with your health care provider. Document Released: 01/17/2015 Document Revised: 09/10/2015 Document Reviewed: 10/22/2014 Elsevier Interactive Patient Education  2017 [place] Prevention in the Home Falls can cause injuries. They can happen to people of all ages. There are many things you can do to make your home safe and to help prevent falls. What can I do on the outside of my home? Regularly fix the edges of walkways and driveways and fix any cracks. Remove anything that might make you trip as you walk through a door, such as a raised step or threshold. Trim any bushes or trees on the path to your home. Use bright outdoor lighting. Clear any walking paths of anything that might make someone trip, such as rocks or tools. Regularly check to see if handrails are loose or broken. Make sure that both sides of any steps have handrails. Any raised decks and porches should have guardrails on the edges. Have any leaves, snow, or ice cleared regularly. Use sand or salt on walking paths during winter. Clean up any spills in your garage right away. This includes oil or grease spills. What can I do in the bathroom? Use night lights. Install grab bars by the toilet and in the tub and shower. Do not use towel bars as grab bars. Use non-skid mats or decals in the tub or shower. If you need to sit down in the shower, use a plastic, non-slip stool. Keep the floor dry. Clean up any water that spills on the  floor as soon as it happens. Remove soap buildup in the tub or shower regularly. Attach bath mats securely with double-sided non-slip rug tape. Do not have throw rugs and other things on the floor that can make you trip. What can I do in the bedroom? Use night lights. Make sure that you have a light by your bed that is easy to reach. Do not use any sheets or blankets that are too big for your bed. They should not hang down onto the floor. Have a firm chair that has side arms. You can use this for support while you get dressed. Do not have throw rugs and other things on the floor that can make you trip. What can I do in the kitchen? Clean up any spills right away. Avoid walking on wet floors. Keep items that you use a lot in easy-to-reach places. If you need to reach something above you, use a strong step stool that has a grab bar. Keep electrical cords out of the way. Do not use floor polish or wax that makes floors slippery. If you must use wax, use non-skid floor wax. Do not have throw rugs and other things on the floor that can make you trip. What can I do with my stairs? Do not leave any items on the stairs. Make sure that there are handrails on both sides of the stairs and  use them. Fix handrails that are broken or loose. Make sure that handrails are as long as the stairways. Check any carpeting to make sure that it is firmly attached to the stairs. Fix any carpet that is loose or worn. Avoid having throw rugs at the top or bottom of the stairs. If you do have throw rugs, attach them to the floor with carpet tape. Make sure that you have a light switch at the top of the stairs and the bottom of the stairs. If you do not have them, ask someone to add them for you. What else can I do to help prevent falls? Wear shoes that: Do not have high heels. Have rubber bottoms. Are comfortable and fit you well. Are closed at the toe. Do not wear sandals. If you use a stepladder: Make sure that  it is fully opened. Do not climb a closed stepladder. Make sure that both sides of the stepladder are locked into place. Ask someone to hold it for you, if possible. Clearly mark and make sure that you can see: Any grab bars or handrails. First and last steps. Where the edge of each step is. Use tools that help you move around (mobility aids) if they are needed. These include: Canes. Walkers. Scooters. Crutches. Turn on the lights when you go into a dark area. Replace any light bulbs as soon as they burn out. Set up your furniture so you have a clear path. Avoid moving your furniture around. If any of your floors are uneven, fix them. If there are any pets around you, be aware of where they are. Review your medicines with your doctor. Some medicines can make you feel dizzy. This can increase your chance of falling. Ask your doctor what other things that you can do to help prevent falls. This information is not intended to replace advice given to you by your health care provider. Make sure you discuss any questions you have with your health care provider. Document Released: 10/17/2008 Document Revised: 05/29/2015 Document Reviewed: 01/25/2014 Elsevier Interactive Patient Education  2017 [employer].

## 2021-07-01 NOTE — Telephone Encounter (Signed)
Requested medication (s) are due for refill today: yes  Requested medication (s) are on the active medication list: yes  Last refill:  06/02/21 #30/0  Future visit scheduled: yes tomorrow at 1100  Notes to clinic:  Unable to refill per protocol due to failed labs, no updated results.      Requested Prescriptions  Pending Prescriptions Disp Refills   tamsulosin (FLOMAX) 0.4 MG CAPS capsule [Pharmacy Med Name: TAMSULOSIN HCL 0.4 MG CAPSULE] 30 capsule 0    Sig: TAKE 1 CAPSULE BY MOUTH EVERY DAY     Urology: Alpha-Adrenergic Blocker Failed - 07/01/2021  2:36 PM      Failed - PSA in normal range and within 360 days    PSA  Date Value Ref Range Status  07/24/2019 0.3 < OR = 4.0 ng/mL Final    Comment:    The total PSA value from this assay system is  standardized against the WHO standard. The test  result will be approximately 20% lower when compared  to the equimolar-standardized total PSA (Beckman  Coulter). Comparison of serial PSA results should be  interpreted with this fact in mind. . This test was performed using the Siemens  chemiluminescent method. Values obtained from  different assay methods cannot be used interchangeably. PSA levels, regardless of value, should not be interpreted as absolute evidence of the presence or absence of disease.          Failed - Last BP in normal range    BP Readings from Last 1 Encounters:  11/12/20 135/90         Passed - Valid encounter within last 12 months    Recent Outpatient Visits           9 months ago Leg swelling   [place] Susy Frizzle, MD   9 months ago Abscess of left lower leg   [place] Susy Frizzle, MD   9 months ago Abscess of left lower leg   [place] Susy Frizzle, MD   10 months ago Abscess of left lower leg   [place] Susy Frizzle, MD   10 months ago Abscess of left lower leg   [place]  Pickard, Cammie Mcgee, MD

## 2021-07-02 ENCOUNTER — Other Ambulatory Visit: Payer: Self-pay | Admitting: Family Medicine

## 2021-07-02 ENCOUNTER — Ambulatory Visit (INDEPENDENT_AMBULATORY_CARE_PROVIDER_SITE_OTHER): Payer: Medicare HMO | Admitting: Family Medicine

## 2021-07-02 ENCOUNTER — Ambulatory Visit (INDEPENDENT_AMBULATORY_CARE_PROVIDER_SITE_OTHER): Payer: Medicare HMO

## 2021-07-02 VITALS — Ht 68.0 in | Wt 334.0 lb

## 2021-07-02 DIAGNOSIS — N3941 Urge incontinence: Secondary | ICD-10-CM | POA: Diagnosis not present

## 2021-07-02 DIAGNOSIS — R0609 Other forms of dyspnea: Secondary | ICD-10-CM | POA: Diagnosis not present

## 2021-07-02 DIAGNOSIS — N401 Enlarged prostate with lower urinary tract symptoms: Secondary | ICD-10-CM

## 2021-07-02 DIAGNOSIS — R600 Localized edema: Secondary | ICD-10-CM | POA: Diagnosis not present

## 2021-07-02 DIAGNOSIS — Z0000 Encounter for general adult medical examination without abnormal findings: Secondary | ICD-10-CM

## 2021-07-02 DIAGNOSIS — Z6841 Body Mass Index (BMI) 40.0 and over, adult: Secondary | ICD-10-CM

## 2021-07-02 MED ORDER — OXYBUTYNIN CHLORIDE 5 MG PO TABS: 5.0000 mg | ORAL_TABLET | Freq: Two times a day (BID) | ORAL | 5 refills | Status: DC

## 2021-07-02 MED ORDER — WEGOVY 0.5 MG/0.5ML ~~LOC~~ SOAJ
0.5000 mg | SUBCUTANEOUS | 0 refills | Status: DC
Start: 2021-07-02 — End: 2021-07-22

## 2021-07-02 MED ORDER — TAMSULOSIN HCL 0.4 MG PO CAPS: 0.4000 mg | ORAL_CAPSULE | Freq: Every day | ORAL | 3 refills | Status: DC

## 2021-07-02 NOTE — Progress Notes (Signed)
Subjective:   Keith Watkins is a 76 y.o. male who presents for Medicare Annual/Subsequent preventive examination. Virtual Visit via Telephone Note  I connected with  Marland Kitchen on 07/02/21 at  2:00 PM EDT by telephone and verified that I am speaking with the correct person using two identifiers.  Location: Patient: HOME Provider: [facility] Persons participating in the virtual visit: patient/Nurse Health Advisor   I discussed the limitations, risks, security and privacy concerns of performing an evaluation and management service by telephone and the availability of in person appointments. The patient expressed understanding and agreed to proceed.  Interactive audio and video telecommunications were attempted between this nurse and patient, however failed, due to patient having technical difficulties OR patient did not have access to video capability.  We continued and completed visit with audio only.  Some vital signs may be absent or patient reported.   Chriss Driver, LPN  Review of Systems     Cardiac Risk Factors include: advanced age (>49mn, >>12women);male gender;sedentary lifestyle;obesity (BMI >30kg/m2);Other (see comment), Risk factor comments: Lumar Spinal Stenosis.     Objective:    Today's Vitals   07/02/21 1406  Weight: (!) 334 lb (151.5 kg)  Height: '5\' 8"'$  (1.727 m)   Body mass index is 50.78 kg/m.     07/02/2021    2:17 PM 05/30/2020    8:58 AM 07/27/2019    8:30 AM 06/21/2018   11:04 AM 06/11/2015    9:53 AM 06/20/2014    9:00 AM 03/18/2014   10:54 AM  Advanced Directives  Does Patient Have a Medical Advance Directive? Yes Yes Yes Yes Yes Yes Yes  Type of AParamedicof [place]Living will [place]Coop[place]Living will [place]Living will;Out of facility DNR (pink MOST or yellow form) HO[place]Living will HCambrian ParkLiving will  [place]Living will   Does patient want to make changes to medical advance directive?  No - Patient declined       Copy of HYalein Chart? No - copy requested No - copy requested  No - copy requested No - copy requested  No - copy requested    Current Medications (verified) Outpatient Encounter Medications as of 07/02/2021  Medication Sig   oxybutynin (DITROPAN) 5 MG tablet Take 1 tablet (5 mg total) by mouth 2 (two) times daily.   Semaglutide-Weight Management (WEGOVY) 0.5 MG/0.5ML SOAJ Inject 0.5 mg into the skin once a week.   tamsulosin (FLOMAX) 0.4 MG CAPS capsule Take 1 capsule (0.4 mg total) by mouth daily.   [DISCONTINUED] mirabegron ER (MYRBETRIQ) 50 MG TB24 tablet Take 50 mg by mouth daily. (Patient not taking: Reported on 07/02/2021)   [DISCONTINUED] tamsulosin (FLOMAX) 0.4 MG CAPS capsule TAKE 1 CAPSULE BY MOUTH EVERY DAY   No facility-administered encounter medications on file as of 07/02/2021.    Allergies (verified) Patient has no known allergies.   History: Past Medical History:  Diagnosis Date   Arthritis    Back pain    Cataract    Cellulitis 07/31/2011   Enlarged prostate    Foot abscess, left 08/07/2011   Hypertension    prior to gastric bypass- no meds taken   Lumbar spinal stenosis    Obesity 08/09/2011   Past Surgical History:  Procedure Laterality Date   APPENDECTOMY     arthroscopic knee surgery     CHOLECYSTECTOMY     I &  D EXTREMITY  08/07/2011   Procedure: IRRIGATION AND DEBRIDEMENT EXTREMITY;  Surgeon: Wylene Simmer, MD;  Location: [facility];  Service: Orthopedics;  Laterality: Left;  irrigation and debridement left foot abscess   stomach stapled     Family History  Problem Relation Age of Onset   Heart disease Mother    Cancer Sister        Breast   Breast cancer Sister    Hypertension Brother    Hypertension Maternal Grandmother    Stroke Maternal Grandmother    Heart disease Maternal Grandfather    Hypertension Maternal Grandfather    Diabetes  Sister    Heart disease Sister    Hyperlipidemia Sister    Hypertension Sister    Stroke Sister    Diabetes Other    Stroke Other    Hypertension Other    Coronary artery disease Other    Heart failure Other    Colon cancer Neg Hx    Esophageal cancer Neg Hx    Stomach cancer Neg Hx    Rectal cancer Neg Hx    Social History   Socioeconomic History   Marital status: Married    Spouse name: Pamala Hurry   Number of children: 3   Years of education: Not on file   Highest education level: Not on file  Occupational History   Not on file  Tobacco Use   Smoking status: Never   Smokeless tobacco: Never  Substance and Sexual Activity   Alcohol use: No   Drug use: No   Sexual activity: Yes  Other Topics Concern   Not on file  Social History Narrative   3 daughters.   Still working.    Social Determinants of Health   Financial Resource Strain: Low Risk  (07/02/2021)   Overall Financial Resource Strain (CARDIA)    Difficulty of Paying Living Expenses: Not hard at all  Food Insecurity: No Food Insecurity (07/02/2021)   Hunger Vital Sign    Worried About Running Out of Food in the Last Year: Never true    Ran Out of Food in the Last Year: Never true  Transportation Needs: No Transportation Needs (07/02/2021)   PRAPARE - Hydrologist (Medical): No    Lack of Transportation (Non-Medical): No  Physical Activity: Inactive (07/02/2021)   Exercise Vital Sign    Days of Exercise per Week: 0 days    Minutes of Exercise per Session: 0 min  Stress: No Stress Concern Present (07/02/2021)   [place]    Feeling of Stress : Not at all  Social Connections: [place] (07/02/2021)   Social Connection and Isolation Panel [NHANES]    Frequency of Communication with Friends and Family: More than three times a week    Frequency of Social Gatherings with Friends and Family: More than three times a  week    Attends Religious Services: More than 4 times per year    Active Member of [employer] or Organizations: Yes    Attends Music therapist: More than 4 times per year    Marital Status: Married    Tobacco Counseling Counseling given: Not Answered   Clinical Intake:  Pre-visit preparation completed: Yes  Pain : No/denies pain     BMI - recorded: 50.78 Nutritional Status: BMI > 30  Obese Nutritional Risks: None Diabetes: No  How often do you need to have someone help you when you read instructions, pamphlets, or  other written materials from your doctor or pharmacy?: 1 - Never  Diabetic?NO  Interpreter Needed?: No  Information entered by :: mj Jeremih Dearmas, lpn   Activities of Daily Living    07/02/2021    2:12 PM 07/01/2021    3:46 PM  In your present state of health, do you have any difficulty performing the following activities:  Hearing? 1 1  Vision? 0 0  Difficulty concentrating or making decisions? 0 0  Walking or climbing stairs? 1 1  Dressing or bathing? 0 0  Doing errands, shopping? 0 0  Preparing Food and eating ? N N  Using the Toilet? N N  In the past six months, have you accidently leaked urine? Y Y  Do you have problems with loss of bowel control? N N  Managing your Medications? N N  Managing your Finances? N N  Housekeeping or managing your Housekeeping? N N    Patient Care Team: Susy Frizzle, MD as PCP - General (Family Medicine)  Indicate any recent Medical Services you may have received from other than [facility] providers in the past year (date may be approximate).     Assessment:   This is a routine wellness examination for Mavryk.  Hearing/Vision screen Hearing Screening - Comments:: Some hearing loss.  Vision Screening - Comments:: Glasses. Bifocal. 07/2020. Dr. Einar Gip.  Dietary issues and exercise activities discussed: Current Exercise Habits: The patient does not participate in regular exercise at present, Exercise limited  by: orthopedic condition(s)   Goals Addressed   None    Depression Screen    07/02/2021    2:09 PM 09/16/2020   11:21 AM 05/30/2020    9:06 AM 07/27/2019    8:34 AM 07/27/2019    8:33 AM 11/21/2018    8:24 AM 04/21/2017    9:31 AM  PHQ 2/9 Scores  PHQ - 2 Score 0 0 0 0 0 0 0    Fall Risk    07/02/2021    2:10 PM 07/01/2021    3:46 PM 09/16/2020   11:21 AM 05/30/2020    9:01 AM 07/27/2019    8:31 AM  Fall Risk   Falls in the past year? 1 1 0 1 1  Comment     13 months ago  Number falls in past yr: 0 0 0 0 0  Injury with Fall? 1 1 0 0 1  Risk for fall due to : History of fall(s);Impaired balance/gait   Impaired balance/gait   Follow up Falls prevention discussed   Falls evaluation completed;Falls prevention discussed Falls evaluation completed    FALL RISK PREVENTION PERTAINING TO THE HOME:  Any stairs in or around the home? Yes  If so, are there any without handrails? No  Home free of loose throw rugs in walkways, pet beds, electrical cords, etc? Yes  Adequate lighting in your home to reduce risk of falls? Yes   ASSISTIVE DEVICES UTILIZED TO PREVENT FALLS:  Life alert? No  Use of a cane, walker or w/c? Yes  Grab bars in the bathroom? Yes  Shower chair or bench in shower? Yes  Elevated toilet seat or a handicapped toilet? Yes   TIMED UP AND GO:  Was the test performed? No .  Phone visit.  Cognitive Function:        07/02/2021    2:17 PM 07/27/2019    8:37 AM  6CIT Screen  What Year? 0 points 0 points  What month? 0 points 0 points  What time? 0  points 0 points  Count back from 20 0 points 0 points  Months in reverse 0 points 0 points  Repeat phrase 0 points 0 points  Total Score 0 points 0 points    Immunizations Immunization History  Administered Date(s) Administered   Fluad Quad(high Dose 65+) 09/27/2018   Influenza,inj,Quad PF,6+ Mos 02/06/2013, 02/14/2014   Influenza-Unspecified 01/20/2017   PFIZER(Purple Top)SARS-COV-2 Vaccination 02/17/2019,  03/14/2019   Pneumococcal Conjugate-13 02/06/2013   Pneumococcal Polysaccharide-23 08/09/2011    TDAP status: Due, Education has been provided regarding the importance of this vaccine. Advised may receive this vaccine at local pharmacy or Health Dept. Aware to provide a copy of the vaccination record if obtained from local pharmacy or Health Dept. Verbalized acceptance and understanding.  Flu Vaccine status: Due, Education has been provided regarding the importance of this vaccine. Advised may receive this vaccine at local pharmacy or Health Dept. Aware to provide a copy of the vaccination record if obtained from local pharmacy or Health Dept. Verbalized acceptance and understanding.  Pneumococcal vaccine status: Up to date  Covid-19 vaccine status: Completed vaccines  Qualifies for Shingles Vaccine? Yes   Zostavax completed No   Shingrix Completed?: No.    Education has been provided regarding the importance of this vaccine. Patient has been advised to call insurance company to determine out of pocket expense if they have not yet received this vaccine. Advised may also receive vaccine at local pharmacy or Health Dept. Verbalized acceptance and understanding.  Screening Tests Health Maintenance  Topic Date Due   COVID-19 Vaccine (3 - Pfizer series) 07/18/2021 (Originally 05/09/2019)   Zoster Vaccines- Shingrix (1 of 2) 01/01/2022 (Originally 09/21/1995)   INFLUENZA VACCINE  08/04/2021   TETANUS/TDAP  01/04/2022   COLONOSCOPY (Pts 45-9yr Insurance coverage will need to be confirmed)  03/07/2024   Pneumonia Vaccine 76 Years old  Completed   Hepatitis C Screening  Completed   HPV VACCINES  Aged Out    Health Maintenance  There are no preventive care reminders to display for this patient.   Colorectal cancer screening: Type of screening: Colonoscopy. Completed 03/08/2014. Repeat every 10 years  Lung Cancer Screening: (Low Dose CT Chest recommended if Age 76-80years, 30 pack-year  currently smoking OR have quit w/in 15years.) does not qualify.   Additional Screening:  Hepatitis C Screening: does qualify; Completed 04/15/2016  Vision Screening: Recommended annual ophthalmology exams for early detection of glaucoma and other disorders of the eye. Is the patient up to date with their annual eye exam?  Yes  Who is the provider or what is the name of the office in which the patient attends annual eye exams? Dr. MEinar GipIf pt is not established with a provider, would they like to be referred to a provider to establish care? No .   Dental Screening: Recommended annual dental exams for proper oral hygiene  Community Resource Referral / Chronic Care Management: CRR required this visit?  No   CCM required this visit?  No      Plan:     I have personally reviewed and noted the following in the patient's chart:   Medical and social history Use of alcohol, tobacco or illicit drugs  Current medications and supplements including opioid prescriptions. Patient is not currently taking opioid prescriptions. Functional ability and status Nutritional status Physical activity Advanced directives List of other physicians Hospitalizations, surgeries, and ER visits in previous 12 months Vitals Screenings to include cognitive, depression, and falls Referrals and appointments  In addition,  I have reviewed and discussed with patient certain preventive protocols, quality metrics, and best practice recommendations. A written personalized care plan for preventive services as well as general preventive health recommendations were provided to patient.     Chriss Driver, LPN   2/84/0698   Nurse Notes: Discussed Shingrix and how to obtain.

## 2021-07-02 NOTE — Telephone Encounter (Signed)
Requested medication (s) are due for refill today- no  Requested medication (s) are on the active medication list -yes  Future visit scheduled -no  Last refill: 07/02/21  Notes to clinic: Pharmacy message: Alternative requested- product not covered  Requested Prescriptions  Pending Prescriptions Disp Refills   WEGOVY 0.5 MG/0.5ML SOAJ [Pharmacy Med Name: WEGOVY 0.5 MG/0.5 ML PEN]  0    Sig: Inject 0.5 mg into the skin once a week.     Endocrinology:  Diabetes - GLP-1 Receptor Agonists - semaglutide Failed - 07/02/2021 12:17 PM      Failed - HBA1C in normal range and within 180 days    Hgb A1c MFr Bld  Date Value Ref Range Status  08/07/2011 5.6 <5.7 % Final    Comment:    (NOTE)                                                                       According to the ADA Clinical Practice Recommendations for 2011, when HbA1c is used as a screening test:  >=6.5%   Diagnostic of Diabetes Mellitus           (if abnormal result is confirmed) 5.7-6.4%   Increased risk of developing Diabetes Mellitus References:Diagnosis and Classification of Diabetes Mellitus,Diabetes GQQP,6195,09(TOIZT 1):S62-S69 and Standards of Medical Care in         Diabetes - 2011,Diabetes IWPY,0998,33 (Suppl 1):S11-S61.         Failed - Valid encounter within last 6 months    Recent Outpatient Visits           9 months ago Leg swelling   [place] Susy Frizzle, MD   9 months ago Abscess of left lower leg   [place] Dennard Schaumann, Cammie Mcgee, MD   9 months ago Abscess of left lower leg   [place] Dennard Schaumann, Cammie Mcgee, MD   10 months ago Abscess of left lower leg   [place] Susy Frizzle, MD   10 months ago Abscess of left lower leg   [place] Dennard Schaumann, Cammie Mcgee, MD              Passed - Cr in normal range and within 360 days    Creatinine  Date Value Ref Range Status  03/18/2014 0.8 0.7 - 1.3 mg/dL  Final   Creat  Date Value Ref Range Status  07/24/2019 0.96 0.70 - 1.18 mg/dL Final    Comment:    For patients >76 years of age, the reference limit for Creatinine is approximately 13% higher for people identified as African-American. .    Creatinine, Ser  Date Value Ref Range Status  08/19/2020 0.89 0.61 - 1.24 mg/dL Final            Requested Prescriptions  Pending Prescriptions Disp Refills   WEGOVY 0.5 MG/0.5ML SOAJ [Pharmacy Med Name: WEGOVY 0.5 MG/0.5 ML PEN]  0    Sig: Inject 0.5 mg into the skin once a week.     Endocrinology:  Diabetes - GLP-1 Receptor Agonists - semaglutide Failed - 07/02/2021 12:17 PM      Failed - HBA1C in normal range and within 180 days  Hgb A1c MFr Bld  Date Value Ref Range Status  08/07/2011 5.6 <5.7 % Final    Comment:    (NOTE)                                                                       According to the ADA Clinical Practice Recommendations for 2011, when HbA1c is used as a screening test:  >=6.5%   Diagnostic of Diabetes Mellitus           (if abnormal result is confirmed) 5.7-6.4%   Increased risk of developing Diabetes Mellitus References:Diagnosis and Classification of Diabetes Mellitus,Diabetes MLYY,5035,46(FKCLE 1):S62-S69 and Standards of Medical Care in         Diabetes - 2011,Diabetes XNTZ,0017,49 (Suppl 1):S11-S61.         Failed - Valid encounter within last 6 months    Recent Outpatient Visits           9 months ago Leg swelling   [place] Susy Frizzle, MD   9 months ago Abscess of left lower leg   [place] Dennard Schaumann, Cammie Mcgee, MD   9 months ago Abscess of left lower leg   [place] Dennard Schaumann, Cammie Mcgee, MD   10 months ago Abscess of left lower leg   [place] Susy Frizzle, MD   10 months ago Abscess of left lower leg   [place] Dennard Schaumann, Cammie Mcgee, MD              Passed - Cr in normal range  and within 360 days    Creatinine  Date Value Ref Range Status  03/18/2014 0.8 0.7 - 1.3 mg/dL Final   Creat  Date Value Ref Range Status  07/24/2019 0.96 0.70 - 1.18 mg/dL Final    Comment:    For patients >66 years of age, the reference limit for Creatinine is approximately 13% higher for people identified as African-American. .    Creatinine, Ser  Date Value Ref Range Status  08/19/2020 0.89 0.61 - 1.24 mg/dL Final

## 2021-07-02 NOTE — Progress Notes (Signed)
Subjective:    Patient ID: Keith Watkins, male    DOB: 12-09-1945, 76 y.o.   MRN: [id number]  HPI Patient is a 76 year old Caucasian gentleman with a history of morbid obesity.  He also has +2 pitting edema in both legs distal to his knee.  He saw vascular surgery who performed ultrasounds on his legs and told the patient that he did not have venous insufficiency.  Is perhaps lymphedema however he also reports some dyspnea on exertion so ingestive heart failure is a concern.  He denies any chest pain.  He denies any orthopnea.  He denies any paroxysmal nocturnal dyspnea.  He has never had an echocardiogram.  His blood pressure is acceptable.  He is due for fasting lab work. Past Medical History:  Diagnosis Date   Arthritis    Back pain    Cataract    Cellulitis 07/31/2011   Enlarged prostate    Foot abscess, left 08/07/2011   Hypertension    prior to gastric bypass- no meds taken   Lumbar spinal stenosis    Obesity 08/09/2011   Past Surgical History:  Procedure Laterality Date   APPENDECTOMY     arthroscopic knee surgery     CHOLECYSTECTOMY     I & D EXTREMITY  08/07/2011   Procedure: IRRIGATION AND DEBRIDEMENT EXTREMITY;  Surgeon: Wylene Simmer, MD;  Location: [facility];  Service: Orthopedics;  Laterality: Left;  irrigation and debridement left foot abscess   stomach stapled     Current Outpatient Medications on File Prior to Visit  Medication Sig Dispense Refill   mirabegron ER (MYRBETRIQ) 50 MG TB24 tablet Take 50 mg by mouth daily.     tamsulosin (FLOMAX) 0.4 MG CAPS capsule TAKE 1 CAPSULE BY MOUTH EVERY DAY 30 capsule 0   No current facility-administered medications on file prior to visit.   No Known Allergies Social History   Socioeconomic History   Marital status: Married    Spouse name: Not on file   Number of children: Not on file   Years of education: Not on file   Highest education level: Not on file  Occupational History   Not on file  Tobacco Use   Smoking status:  Never   Smokeless tobacco: Never  Substance and Sexual Activity   Alcohol use: No   Drug use: No   Sexual activity: Not on file  Other Topics Concern   Not on file  Social History Narrative   Not on file   Social Determinants of Health   Financial Resource Strain: Low Risk  (05/30/2020)   Overall Financial Resource Strain (CARDIA)    Difficulty of Paying Living Expenses: Not hard at all  Food Insecurity: No Food Insecurity (05/30/2020)   Hunger Vital Sign    Worried About Running Out of Food in the Last Year: Never true    [place] in the Last Year: Never true  Transportation Needs: No Transportation Needs (05/30/2020)   PRAPARE - Hydrologist (Medical): No    Lack of Transportation (Non-Medical): No  Physical Activity: Inactive (05/30/2020)   Exercise Vital Sign    Days of Exercise per Week: 0 days    Minutes of Exercise per Session: 0 min  Stress: No Stress Concern Present (05/30/2020)   [place]    Feeling of Stress : Not at all  Social Connections: Moderately Integrated (05/30/2020)   Social Connection and Isolation Panel [  NHANES]    Frequency of Communication with Friends and Family: More than three times a week    Frequency of Social Gatherings with Friends and Family: More than three times a week    Attends Religious Services: More than 4 times per year    Active Member of [employer] or Organizations: No    Attends Archivist Meetings: Never    Marital Status: Married  Human resources officer Violence: Not At Risk (05/30/2020)   Humiliation, Afraid, Rape, and Kick questionnaire    Fear of Current or Ex-Partner: No    Emotionally Abused: No    Physically Abused: No    Sexually Abused: No      Review of Systems  All other systems reviewed and are negative.      Objective:   Physical Exam Vitals reviewed.  Constitutional:      Appearance: He is obese.   Cardiovascular:     Rate and Rhythm: Normal rate and regular rhythm.     Heart sounds: Normal heart sounds.  Pulmonary:     Effort: Pulmonary effort is normal. No respiratory distress.     Breath sounds: Normal breath sounds. No wheezing.  Abdominal:     General: Bowel sounds are normal. There is no distension.     Tenderness: There is no abdominal tenderness. There is no guarding.  Musculoskeletal:     Right lower leg: Edema present.     Left lower leg: Edema present.  Skin:    Findings: No erythema.  Neurological:     Mental Status: He is alert.           Assessment & Plan:  Morbid obesity with BMI of 45.0-49.9, adult ([place]) - Plan: CBC with Differential/Platelet, Lipid panel, COMPLETE METABOLIC PANEL WITH GFR  Bilateral lower extremity edema - Plan: CBC with Differential/Platelet, Lipid panel, COMPLETE METABOLIC PANEL WITH GFR  Benign prostatic hyperplasia (BPH) with urinary urge incontinence - Plan: tamsulosin (FLOMAX) 0.4 MG CAPS capsule  Dyspnea on exertion - Plan: ECHOCARDIOGRAM COMPLETE Given his dyspnea on exertion and morbid obesity and leg swelling I would like to do an echocardiogram to evaluate for congestive heart failure or even diastolic dysfunction.  Await the results of the echocardiogram before deciding on Lasix.  Check CBC, CMP, fasting lipid panel.  Also recommended trying Wegovy 0.5 mg subcu weekly for weight loss.  Patient needs to lose weight to reduce overall morbidity and mortality risk.

## 2021-07-03 LAB — CBC WITH DIFFERENTIAL/PLATELET
Absolute Monocytes: 638 cells/uL (ref 200–950)
Basophils Absolute: 48 cells/uL (ref 0–200)
Basophils Relative: 1 %
Eosinophils Absolute: 744 cells/uL — ABNORMAL HIGH (ref 15–500)
Eosinophils Relative: 15.5 %
HCT: 43.2 % (ref 38.5–50.0)
Hemoglobin: 14.3 g/dL (ref 13.2–17.1)
Lymphs Abs: 1310 cells/uL (ref 850–3900)
MCH: 30 pg (ref 27.0–33.0)
MCHC: 33.1 g/dL (ref 32.0–36.0)
MCV: 90.8 fL (ref 80.0–100.0)
MPV: 10.4 fL (ref 7.5–12.5)
Monocytes Relative: 13.3 %
Neutro Abs: 2059 cells/uL (ref 1500–7800)
Neutrophils Relative %: 42.9 %
Platelets: 199 10*3/uL (ref 140–400)
RBC: 4.76 10*6/uL (ref 4.20–5.80)
RDW: 12.6 % (ref 11.0–15.0)
Total Lymphocyte: 27.3 %
WBC: 4.8 10*3/uL (ref 3.8–10.8)

## 2021-07-03 LAB — LIPID PANEL
Cholesterol: 123 mg/dL (ref <200)
HDL: 39 mg/dL — ABNORMAL LOW (ref >=40)
LDL Cholesterol (Calc): 67 mg/dL (calc)
Non-HDL Cholesterol (Calc): 84 mg/dL (calc) (ref <130)
Total CHOL/HDL Ratio: 3.2 (calc) (ref <5.0)
Triglycerides: 90 mg/dL (ref <150)

## 2021-07-03 LAB — COMPLETE METABOLIC PANEL WITH GFR
AG Ratio: 1 (calc) (ref 1.0–2.5)
ALT: 8 U/L — ABNORMAL LOW (ref 9–46)
AST: 15 U/L (ref 10–35)
Albumin: 3 g/dL — ABNORMAL LOW (ref 3.6–5.1)
Alkaline phosphatase (APISO): 66 U/L (ref 35–144)
BUN: 15 mg/dL (ref 7–25)
CO2: 31 mmol/L (ref 20–32)
Calcium: 8.5 mg/dL — ABNORMAL LOW (ref 8.6–10.3)
Chloride: 105 mmol/L (ref 98–110)
Creat: 0.75 mg/dL (ref 0.70–1.28)
Globulin: 2.9 g/dL (calc) (ref 1.9–3.7)
Glucose, Bld: 101 mg/dL — ABNORMAL HIGH (ref 65–99)
Potassium: 4 mmol/L (ref 3.5–5.3)
Sodium: 141 mmol/L (ref 135–146)
Total Bilirubin: 0.8 mg/dL (ref 0.2–1.2)
Total Protein: 5.9 g/dL — ABNORMAL LOW (ref 6.1–8.1)
eGFR: 94 mL/min/{1.73_m2} (ref >=60)

## 2021-07-29 ENCOUNTER — Ambulatory Visit (HOSPITAL_COMMUNITY): Payer: Medicare HMO | Attending: Internal Medicine

## 2021-07-29 DIAGNOSIS — N3941 Urge incontinence: Secondary | ICD-10-CM | POA: Diagnosis not present

## 2021-07-29 DIAGNOSIS — R350 Frequency of micturition: Secondary | ICD-10-CM | POA: Diagnosis not present

## 2021-07-29 DIAGNOSIS — R0609 Other forms of dyspnea: Secondary | ICD-10-CM

## 2021-07-29 LAB — ECHOCARDIOGRAM COMPLETE
Area-P 1/2: 3.34 cm2
Calc EF: 52.4 %
S' Lateral: 2.9 cm
Single Plane A2C EF: 53.8 %
Single Plane A4C EF: 49.5 %

## 2021-08-07 ENCOUNTER — Ambulatory Visit: Payer: Medicare HMO | Admitting: Family Medicine

## 2021-08-10 ENCOUNTER — Ambulatory Visit (INDEPENDENT_AMBULATORY_CARE_PROVIDER_SITE_OTHER): Payer: Medicare HMO | Admitting: Family Medicine

## 2021-08-10 VITALS — BP 120/82 | HR 80 | Temp 98.4°F | Ht 70.0 in | Wt 327.2 lb

## 2021-08-10 DIAGNOSIS — R600 Localized edema: Secondary | ICD-10-CM

## 2021-08-10 DIAGNOSIS — Z6841 Body Mass Index (BMI) 40.0 and over, adult: Secondary | ICD-10-CM

## 2021-08-10 NOTE — Progress Notes (Signed)
Subjective:    Patient ID: Keith Watkins, male    DOB: 08/29/1945, 76 y.o.   MRN: [id number]  HPI Patient is a 76 year old Caucasian gentleman with a history of morbid obesity.  Recent Echo revealed: IMPRESSIONS     1. Technically difficult study. Left ventricular ejection fraction, by  estimation, is 50 to 55%. The left ventricle has low normal function. Left  ventricular endocardial border not optimally defined to evaluate regional  wall motion. There is mild left  ventricular hypertrophy. Left ventricular diastolic parameters were  normal.   2. Right ventricular systolic function is normal. The right ventricular  size is normal. Tricuspid regurgitation signal is inadequate for assessing  PA pressure.   3. The mitral valve is normal in structure. No evidence of mitral valve  regurgitation.   4. The aortic valve was not well visualized. Aortic valve regurgitation  is not visualized. No aortic stenosis is present.   5. Aortic dilatation noted. There is dilatation of the ascending aorta,  measuring 41 mm.   6. The inferior vena cava is dilated in size with <50% respiratory  variability, suggesting right atrial pressure of 15 mmHg.    Past Medical History:  Diagnosis Date   Arthritis    Back pain    Cataract    Cellulitis 07/31/2011   Enlarged prostate    Foot abscess, left 08/07/2011   Hypertension    prior to gastric bypass- no meds taken   Lumbar spinal stenosis    Obesity 08/09/2011   Past Surgical History:  Procedure Laterality Date   APPENDECTOMY     arthroscopic knee surgery     CHOLECYSTECTOMY     I & D EXTREMITY  08/07/2011   Procedure: IRRIGATION AND DEBRIDEMENT EXTREMITY;  Surgeon: Wylene Simmer, MD;  Location: [facility];  Service: Orthopedics;  Laterality: Left;  irrigation and debridement left foot abscess   stomach stapled     Current Outpatient Medications on File Prior to Visit  Medication Sig Dispense Refill   oxybutynin (DITROPAN) 5 MG tablet Take 1 tablet (5  mg total) by mouth 2 (two) times daily. 60 tablet 5   tamsulosin (FLOMAX) 0.4 MG CAPS capsule Take 1 capsule (0.4 mg total) by mouth daily. 90 capsule 3   Vibegron (GEMTESA PO)      No current facility-administered medications on file prior to visit.   No Known Allergies Social History   Socioeconomic History   Marital status: Married    Spouse name: Pamala Hurry   Number of children: 3   Years of education: Not on file   Highest education level: Not on file  Occupational History   Not on file  Tobacco Use   Smoking status: Never   Smokeless tobacco: Never  Substance and Sexual Activity   Alcohol use: No   Drug use: No   Sexual activity: Yes  Other Topics Concern   Not on file  Social History Narrative   3 daughters.   Still working.    Social Determinants of Health   Financial Resource Strain: Low Risk  (07/02/2021)   Overall Financial Resource Strain (CARDIA)    Difficulty of Paying Living Expenses: Not hard at all  Food Insecurity: No Food Insecurity (07/02/2021)   Hunger Vital Sign    Worried About Running Out of Food in the Last Year: Never true    Ran Out of Food in the Last Year: Never true  Transportation Needs: No Transportation Needs (07/02/2021)   PRAPARE - Transportation  Lack of Transportation (Medical): No    Lack of Transportation (Non-Medical): No  Physical Activity: Inactive (07/02/2021)   Exercise Vital Sign    Days of Exercise per Week: 0 days    Minutes of Exercise per Session: 0 min  Stress: No Stress Concern Present (07/02/2021)   [place]    Feeling of Stress : Not at all  Social Connections: [place] (07/02/2021)   Social Connection and Isolation Panel [NHANES]    Frequency of Communication with Friends and Family: More than three times a week    Frequency of Social Gatherings with Friends and Family: More than three times a week    Attends Religious Services: More than  4 times per year    Active Member of [employer] or Organizations: Yes    Attends Archivist Meetings: More than 4 times per year    Marital Status: Married  Human resources officer Violence: Not At Risk (07/02/2021)   Humiliation, Afraid, Rape, and Kick questionnaire    Fear of Current or Ex-Partner: No    Emotionally Abused: No    Physically Abused: No    Sexually Abused: No      Review of Systems  All other systems reviewed and are negative.      Objective:   Physical Exam Vitals reviewed.  Constitutional:      Appearance: He is obese.  Cardiovascular:     Rate and Rhythm: Normal rate and regular rhythm.     Heart sounds: Normal heart sounds.  Pulmonary:     Effort: Pulmonary effort is normal. No respiratory distress.     Breath sounds: Normal breath sounds. No wheezing.  Abdominal:     General: Bowel sounds are normal. There is no distension.     Tenderness: There is no abdominal tenderness. There is no guarding.  Musculoskeletal:     Right lower leg: Edema present.     Left lower leg: Edema present.  Skin:    Findings: No erythema.  Neurological:     Mental Status: He is alert.           Assessment & Plan:  Morbid obesity with BMI of 45.0-49.9, adult (HCC)  Bilateral lower extremity edema I reviewed the results of the echocardiogram with him.  There was no evidence of diastolic dysfunction although he did have a mildly depressed ejection fraction of 50% however it was a very limited technically difficult study.  I discussed a referral to cardiology however he denies any shortness of breath or chest pain or orthopnea or paroxysmal nocturnal dyspnea.  I believe the majority of his issue stems from his morbid obesity.  Therefore I recommended aggressive lifestyle changes to try to counter the obesity to help prevent further damage.  He eats fast food 3 meals a day.  I believe that would be the most beneficial start and trying to change his diet and his portion sizes  and try to start any amount of exercise that his body will tolerate.  If he develops shortness of breath I would recommend cardiology consult

## 2021-08-13 DIAGNOSIS — H5200 Hypermetropia, unspecified eye: Secondary | ICD-10-CM | POA: Diagnosis not present

## 2021-08-13 DIAGNOSIS — Z0100 Encounter for examination of eyes and vision without abnormal findings: Secondary | ICD-10-CM | POA: Diagnosis not present

## 2021-12-18 ENCOUNTER — Telehealth: Payer: Self-pay

## 2021-12-18 NOTE — Telephone Encounter (Signed)
Erroneous encounter. Please disregard.

## 2022-01-02 ENCOUNTER — Other Ambulatory Visit: Payer: Self-pay | Admitting: Family Medicine

## 2022-01-03 NOTE — Telephone Encounter (Signed)
Requested Prescriptions  Pending Prescriptions Disp Refills   oxybutynin (DITROPAN) 5 MG tablet [Pharmacy Med Name: OXYBUTYNIN 5 MG TABLET] 20 tablet 0    Sig: TAKE 1 TABLET BY MOUTH TWICE A DAY     Urology:  Bladder Agents Failed - 01/02/2022  8:58 AM      Failed - Valid encounter within last 12 months    Recent Outpatient Visits           1 year ago Leg swelling   [place] Susy Frizzle, MD   1 year ago Abscess of left lower leg   [place] Dennard Schaumann Cammie Mcgee, MD   1 year ago Abscess of left lower leg   [place] Susy Frizzle, MD   1 year ago Abscess of left lower leg   [place] Susy Frizzle, MD   1 year ago Abscess of left lower leg   [place] Pickard, Cammie Mcgee, MD       Future Appointments             In 1 week Pickard, Cammie Mcgee, MD [place]

## 2022-01-07 ENCOUNTER — Other Ambulatory Visit: Payer: Medicare HMO

## 2022-01-07 DIAGNOSIS — Z13220 Encounter for screening for lipoid disorders: Secondary | ICD-10-CM | POA: Diagnosis not present

## 2022-01-07 DIAGNOSIS — N3941 Urge incontinence: Secondary | ICD-10-CM | POA: Diagnosis not present

## 2022-01-07 DIAGNOSIS — N401 Enlarged prostate with lower urinary tract symptoms: Secondary | ICD-10-CM

## 2022-01-07 DIAGNOSIS — D721 Eosinophilia, unspecified: Secondary | ICD-10-CM | POA: Diagnosis not present

## 2022-01-07 DIAGNOSIS — Z6841 Body Mass Index (BMI) 40.0 and over, adult: Secondary | ICD-10-CM | POA: Diagnosis not present

## 2022-01-08 LAB — COMPLETE METABOLIC PANEL WITH GFR
AG Ratio: 1.1 (calc) (ref 1.0–2.5)
ALT: 8 U/L — ABNORMAL LOW (ref 9–46)
AST: 13 U/L (ref 10–35)
Albumin: 3.3 g/dL — ABNORMAL LOW (ref 3.6–5.1)
Alkaline phosphatase (APISO): 72 U/L (ref 35–144)
BUN: 22 mg/dL (ref 7–25)
CO2: 29 mmol/L (ref 20–32)
Calcium: 8.8 mg/dL (ref 8.6–10.3)
Chloride: 107 mmol/L (ref 98–110)
Creat: 0.87 mg/dL (ref 0.70–1.28)
Globulin: 2.9 g/dL (calc) (ref 1.9–3.7)
Glucose, Bld: 100 mg/dL — ABNORMAL HIGH (ref 65–99)
Potassium: 4.6 mmol/L (ref 3.5–5.3)
Sodium: 144 mmol/L (ref 135–146)
Total Bilirubin: 0.6 mg/dL (ref 0.2–1.2)
Total Protein: 6.2 g/dL (ref 6.1–8.1)
eGFR: 89 mL/min/{1.73_m2} (ref >=60)

## 2022-01-08 LAB — CBC WITH DIFFERENTIAL/PLATELET
Absolute Monocytes: 640 cells/uL (ref 200–950)
Basophils Absolute: 68 cells/uL (ref 0–200)
Basophils Relative: 1.3 %
Eosinophils Absolute: 640 cells/uL — ABNORMAL HIGH (ref 15–500)
Eosinophils Relative: 12.3 %
HCT: 44.9 % (ref 38.5–50.0)
Hemoglobin: 15.2 g/dL (ref 13.2–17.1)
Lymphs Abs: 1326 cells/uL (ref 850–3900)
MCH: 30.6 pg (ref 27.0–33.0)
MCHC: 33.9 g/dL (ref 32.0–36.0)
MCV: 90.3 fL (ref 80.0–100.0)
MPV: 10.3 fL (ref 7.5–12.5)
Monocytes Relative: 12.3 %
Neutro Abs: 2527 cells/uL (ref 1500–7800)
Neutrophils Relative %: 48.6 %
Platelets: 205 10*3/uL (ref 140–400)
RBC: 4.97 10*6/uL (ref 4.20–5.80)
RDW: 12.1 % (ref 11.0–15.0)
Total Lymphocyte: 25.5 %
WBC: 5.2 10*3/uL (ref 3.8–10.8)

## 2022-01-08 LAB — LIPID PANEL
Cholesterol: 121 mg/dL (ref <200)
HDL: 42 mg/dL (ref >=40)
LDL Cholesterol (Calc): 66 mg/dL (calc)
Non-HDL Cholesterol (Calc): 79 mg/dL (calc) (ref <130)
Total CHOL/HDL Ratio: 2.9 (calc) (ref <5.0)
Triglycerides: 54 mg/dL (ref <150)

## 2022-01-08 LAB — PSA: PSA: 0.31 ng/mL (ref <=4.00)

## 2022-01-12 ENCOUNTER — Encounter: Payer: Self-pay | Admitting: Family Medicine

## 2022-01-12 ENCOUNTER — Ambulatory Visit (INDEPENDENT_AMBULATORY_CARE_PROVIDER_SITE_OTHER): Payer: Medicare HMO | Admitting: Family Medicine

## 2022-01-12 VITALS — BP 128/74 | HR 83 | Ht 70.0 in | Wt 320.0 lb

## 2022-01-12 DIAGNOSIS — M238X2 Other internal derangements of left knee: Secondary | ICD-10-CM | POA: Diagnosis not present

## 2022-01-12 NOTE — Progress Notes (Signed)
Subjective:    Patient ID: Keith Watkins, male    DOB: [birth year], 77 y.o.   MRN: [id number]  Knee Pain   Patient reports laxity around his left knee.  He states that he feels like his knee might buckle if he tries to put his weight on it.  There is no laxity to varus or valgus stress.  He has a negative anterior posterior drawer sign.  There is no tenderness to palpation around the joint line.  However he feels unsteady on his feet and has a difficult time going up and coming down steps due to weakness in his left knee.  He believes this stems from multiple wounds he has had on his left leg due to lymphedema and cellulitis.  In those instances, he had to keep his weight off his leg he believes his muscles got weaker. Lab on 01/07/2022  Component Date Value Ref Range Status   WBC 01/07/2022 5.2  3.8 - 10.8 Thousand/uL Final   RBC 01/07/2022 4.97  4.20 - 5.80 Million/uL Final   Hemoglobin 01/07/2022 15.2  13.2 - 17.1 g/dL Final   HCT 01/07/2022 44.9  38.5 - 50.0 % Final   MCV 01/07/2022 90.3  80.0 - 100.0 fL Final   MCH 01/07/2022 30.6  27.0 - 33.0 pg Final   MCHC 01/07/2022 33.9  32.0 - 36.0 g/dL Final   RDW 01/07/2022 12.1  11.0 - 15.0 % Final   Platelets 01/07/2022 205  140 - 400 Thousand/uL Final   MPV 01/07/2022 10.3  7.5 - 12.5 fL Final   Neutro Abs 01/07/2022 2,527  1,500 - 7,800 cells/uL Final   Lymphs Abs 01/07/2022 1,326  850 - 3,900 cells/uL Final   Absolute Monocytes 01/07/2022 640  200 - 950 cells/uL Final   Eosinophils Absolute 01/07/2022 640 (H)  15 - 500 cells/uL Final   Basophils Absolute 01/07/2022 68  0 - 200 cells/uL Final   Neutrophils Relative % 01/07/2022 48.6  % Final   Total Lymphocyte 01/07/2022 25.5  % Final   Monocytes Relative 01/07/2022 12.3  % Final   Eosinophils Relative 01/07/2022 12.3  % Final   Basophils Relative 01/07/2022 1.3  % Final   Glucose, Bld 01/07/2022 100 (H)  65 - 99 mg/dL Final   Comment: .            Fasting reference interval . For  someone without known diabetes, a glucose value between 100 and 125 mg/dL is consistent with prediabetes and should be confirmed with a follow-up test. .    BUN 01/07/2022 22  7 - 25 mg/dL Final   Creat 01/07/2022 0.87  0.70 - 1.28 mg/dL Final   eGFR 01/07/2022 89  > OR = 60 mL/min/1.34m Final   BUN/Creatinine Ratio 01/07/2022 SEE NOTE:  6 - 22 (calc) Final   Comment:    Not Reported: BUN and Creatinine are within    reference range. .    Sodium 01/07/2022 144  135 - 146 mmol/L Final   Potassium 01/07/2022 4.6  3.5 - 5.3 mmol/L Final   Chloride 01/07/2022 107  98 - 110 mmol/L Final   CO2 01/07/2022 29  20 - 32 mmol/L Final   Calcium 01/07/2022 8.8  8.6 - 10.3 mg/dL Final   Total Protein 01/07/2022 6.2  6.1 - 8.1 g/dL Final   Albumin 01/07/2022 3.3 (L)  3.6 - 5.1 g/dL Final   Globulin 01/07/2022 2.9  1.9 - 3.7 g/dL (calc) Final   AG Ratio 01/07/2022 1.1  1.0 - 2.5 (calc) Final   Total Bilirubin 01/07/2022 0.6  0.2 - 1.2 mg/dL Final   Alkaline phosphatase (APISO) 01/07/2022 72  35 - 144 U/L Final   AST 01/07/2022 13  10 - 35 U/L Final   ALT 01/07/2022 8 (L)  9 - 46 U/L Final   Cholesterol 01/07/2022 121  <200 mg/dL Final   HDL 01/07/2022 42  > OR = 40 mg/dL Final   Triglycerides 01/07/2022 54  <150 mg/dL Final   LDL Cholesterol (Calc) 01/07/2022 66  mg/dL (calc) Final   Comment: Reference range: <100 . Desirable range <100 mg/dL for primary prevention;   <70 mg/dL for patients with CHD or diabetic patients  with > or = 2 CHD risk factors. Marland Kitchen LDL-C is now calculated using the Martin-Hopkins  calculation, which is a validated novel method providing  better accuracy than the Friedewald equation in the  estimation of LDL-C.  Cresenciano Genre et al. Annamaria Helling. 3557;322(02): 2061-2068  (http://education.[facility].com/faq/FAQ164)    Total CHOL/HDL Ratio 01/07/2022 2.9  <5.0 (calc) Final   Non-HDL Cholesterol (Calc) 01/07/2022 79  <130 mg/dL (calc) Final   Comment: For patients with  diabetes plus 1 major ASCVD risk  factor, treating to a non-HDL-C goal of <100 mg/dL  (LDL-C of <70 mg/dL) is considered a therapeutic  option.    PSA 01/07/2022 0.31  < OR = 4.00 ng/mL Final   Comment: The total PSA value from this assay system is  standardized against the WHO standard. The test  result will be approximately 20% lower when compared  to the equimolar-standardized total PSA (Beckman  Coulter). Comparison of serial PSA results should be  interpreted with this fact in mind. . This test was performed using the Siemens  chemiluminescent method. Values obtained from  different assay methods cannot be used interchangeably. PSA levels, regardless of value, should not be interpreted as absolute evidence of the presence or absence of disease.     Past Medical History:  Diagnosis Date   Arthritis    Back pain    Cataract    Cellulitis 07/31/2011   Enlarged prostate    Foot abscess, left 08/07/2011   Hypertension    prior to gastric bypass- no meds taken   Lumbar spinal stenosis    Obesity 08/09/2011   Past Surgical History:  Procedure Laterality Date   APPENDECTOMY     arthroscopic knee surgery     CHOLECYSTECTOMY     I & D EXTREMITY  08/07/2011   Procedure: IRRIGATION AND DEBRIDEMENT EXTREMITY;  Surgeon: Wylene Simmer, MD;  Location: [facility];  Service: Orthopedics;  Laterality: Left;  irrigation and debridement left foot abscess   stomach stapled     Current Outpatient Medications on File Prior to Visit  Medication Sig Dispense Refill   oxybutynin (DITROPAN) 5 MG tablet TAKE 1 TABLET BY MOUTH TWICE A DAY 20 tablet 0   tamsulosin (FLOMAX) 0.4 MG CAPS capsule Take 1 capsule (0.4 mg total) by mouth daily. 90 capsule 3   No current facility-administered medications on file prior to visit.   No Known Allergies Social History   Socioeconomic History   Marital status: Married    Spouse name: Pamala Hurry   Number of children: 3   Years of education: Not on file   Highest  education level: Not on file  Occupational History   Not on file  Tobacco Use   Smoking status: Never   Smokeless tobacco: Never  Substance and Sexual Activity   Alcohol  use: No   Drug use: No   Sexual activity: Yes  Other Topics Concern   Not on file  Social History Narrative   3 daughters.   Still working.    Social Determinants of Health   Financial Resource Strain: Low Risk  (07/02/2021)   Overall Financial Resource Strain (CARDIA)    Difficulty of Paying Living Expenses: Not hard at all  Food Insecurity: No Food Insecurity (07/02/2021)   Hunger Vital Sign    Worried About Running Out of Food in the Last Year: Never true    Ran Out of Food in the Last Year: Never true  Transportation Needs: No Transportation Needs (07/02/2021)   PRAPARE - Hydrologist (Medical): No    Lack of Transportation (Non-Medical): No  Physical Activity: Inactive (07/02/2021)   Exercise Vital Sign    Days of Exercise per Week: 0 days    Minutes of Exercise per Session: 0 min  Stress: No Stress Concern Present (07/02/2021)   [place]    Feeling of Stress : Not at all  Social Connections: [place] (07/02/2021)   Social Connection and Isolation Panel [NHANES]    Frequency of Communication with Friends and Family: More than three times a week    Frequency of Social Gatherings with Friends and Family: More than three times a week    Attends Religious Services: More than 4 times per year    Active Member of [employer] or Organizations: Yes    Attends Archivist Meetings: More than 4 times per year    Marital Status: Married  Human resources officer Violence: Not At Risk (07/02/2021)   Humiliation, Afraid, Rape, and Kick questionnaire    Fear of Current or Ex-Partner: No    Emotionally Abused: No    Physically Abused: No    Sexually Abused: No      Review of Systems  All other systems reviewed  and are negative.      Objective:   Physical Exam Vitals reviewed.  Constitutional:      Appearance: He is obese.  Cardiovascular:     Rate and Rhythm: Normal rate and regular rhythm.     Heart sounds: Normal heart sounds.  Pulmonary:     Effort: Pulmonary effort is normal. No respiratory distress.     Breath sounds: Normal breath sounds. No wheezing.  Abdominal:     General: Bowel sounds are normal. There is no distension.     Tenderness: There is no abdominal tenderness. There is no guarding.  Musculoskeletal:     Right lower leg: Edema present.     Left lower leg: Edema present.  Skin:    Findings: No erythema.  Neurological:     Mental Status: He is alert.           Assessment & Plan:  Joint laxity of left knee - Plan: Ambulatory referral to Physical Therapy Examination of his left knee reveals no ligamentous instability.  I suspect most of this is due to osteoarthritis coupled with muscle deconditioning.  I recommended physical therapy to see if this will improve his muscle strength particularly in his knee flexor and extensor muscles to help prevent falls and instability.  At the present time he is having very little pain so I do not feel that arthritis medication or cortisone injections would be beneficial for him

## 2022-02-23 ENCOUNTER — Other Ambulatory Visit: Payer: Self-pay

## 2022-02-23 ENCOUNTER — Telehealth: Payer: Self-pay | Admitting: Family Medicine

## 2022-02-23 DIAGNOSIS — N401 Enlarged prostate with lower urinary tract symptoms: Secondary | ICD-10-CM

## 2022-02-23 MED ORDER — OXYBUTYNIN CHLORIDE 5 MG PO TABS: 5.0000 mg | ORAL_TABLET | Freq: Two times a day (BID) | ORAL | 1 refills | Status: DC

## 2022-02-23 NOTE — Telephone Encounter (Signed)
Patient's daughter called to follow up on his refill for oxybutynin (DITROPAN) 5 MG tablet [id number]   Patient only received a 20 day supply last time but usually receives a 90 day supply. Requesting for new script with a 90 day supply to be sent in.  Pharmacy confirmed as:  [id number] [place] [id number]: [phone] Fax: [phone] #: [id number]  Please advise at [phone]

## 2022-04-13 ENCOUNTER — Telehealth: Payer: Self-pay | Admitting: Family Medicine

## 2022-04-13 NOTE — Telephone Encounter (Signed)
Patient's daughter requesting follow up on request for patient to see Dr. Rayburn Ma regarding his inability to walk; patient having pain everywhere but the main source of pain is coming from the left knee. Issue has progressively been worsening over the past couple of years. Patient has to hold on the side of the bed and the wall to get up and to walk.   [place] stated she hasn't received a call back regarding this and wants to know if the referral could be sent to Dr. Rayburn Ma if within insurance guidelines.   Please advise with an update at [phone]

## 2022-04-14 ENCOUNTER — Other Ambulatory Visit: Payer: Self-pay

## 2022-04-14 DIAGNOSIS — M238X2 Other internal derangements of left knee: Secondary | ICD-10-CM

## 2022-04-22 ENCOUNTER — Other Ambulatory Visit (INDEPENDENT_AMBULATORY_CARE_PROVIDER_SITE_OTHER): Payer: Medicare HMO

## 2022-04-22 ENCOUNTER — Encounter: Payer: Self-pay | Admitting: Physician Assistant

## 2022-04-22 ENCOUNTER — Ambulatory Visit (INDEPENDENT_AMBULATORY_CARE_PROVIDER_SITE_OTHER): Payer: Medicare HMO | Admitting: Physician Assistant

## 2022-04-22 DIAGNOSIS — G8929 Other chronic pain: Secondary | ICD-10-CM

## 2022-04-22 DIAGNOSIS — M25562 Pain in left knee: Secondary | ICD-10-CM

## 2022-04-22 DIAGNOSIS — M25561 Pain in right knee: Secondary | ICD-10-CM

## 2022-04-22 MED ORDER — LIDOCAINE HCL 1 % IJ SOLN
3.0000 mL | INTRAMUSCULAR | Status: AC | PRN
  Administered 2022-04-22: 3 mL

## 2022-04-22 MED ORDER — METHYLPREDNISOLONE ACETATE 40 MG/ML IJ SUSP
40.0000 mg | INTRAMUSCULAR | Status: AC | PRN
  Administered 2022-04-22: 40 mg via INTRA_ARTICULAR

## 2022-04-22 NOTE — Progress Notes (Signed)
Office Visit Note   Patient: Keith Watkins           Date of Birth: 06/30/1945           MRN: [id number] Visit Date: 04/22/2022              Requested by: Donita Brooks, MD [address] [place],  [place] [address] PCP: Donita Brooks, MD   Assessment & Plan: Visit Diagnoses:  1. Chronic pain of left knee   2. Chronic pain of right knee     Plan: Will have him work on strengthening both knees.  Also working on weight loss.  Do feel that he would benefit from arch supports as he has bilateral pes planus.  Knee friendly exercises were discussed with him at length.  Will see him back in 2 weeks to see how he did with the injection on the left.  Did talk to him about the fact that his BMI would need to be under 40 to undergo knee replacement.  Will obtain a height and weight on him at next office visit.  Follow-Up Instructions: Return in about 2 weeks (around 05/06/2022).   Orders:  Orders Placed This Encounter  Procedures   Large Joint Inj   XR Knee 1-2 Views Left   XR Knee 1-2 Views Right   No orders of the defined types were placed in this encounter.     Procedures: Large Joint Inj: L knee on 04/22/2022 4:46 PM Indications: pain Details: 22 G 1.5 in needle, anterolateral approach  Arthrogram: No  Medications: 3 mL lidocaine 1 %; 40 mg methylPREDNISolone acetate 40 MG/ML Outcome: tolerated well, no immediate complications Procedure, treatment alternatives, risks and benefits explained, specific risks discussed. Consent was given by the patient. Immediately prior to procedure a time out was called to verify the correct patient, procedure, equipment, support staff and site/side marked as required. Patient was prepped and draped in the usual sterile fashion.       Clinical Data: No additional findings.   Subjective: Chief Complaint  Patient presents with   Left Knee - Pain   Right Knee - Pain    HPI Patient is a pleasant 77 year old male seen for the  first time.  He comes in today for left knee pain.  He states that he is having worsening left knee pain particularly over the last 2 years.  He notes some giving way of the left knee at times.  He has some minimal pain in the right knee.  He has trouble walking due to the pain giving way sensation of the left knee.  Does use a cane to ambulate.  He also notes stairs are very difficult for his tried ibuprofen.  Reports a history of previous right knee arthroscopy.  Also history of gastric surgery for weight loss.  He is nondiabetic. Review of Systems Denies any fevers or chills.  Denies any ongoing infections.  Objective: Vital Signs: There were no vitals taken for this visit.  Physical Exam Constitutional:      Appearance: He is not ill-appearing or diaphoretic.  Pulmonary:     Effort: Pulmonary effort is normal.  Neurological:     Mental Status: He is alert and oriented to person, place, and time.  Psychiatric:        Mood and Affect: Mood normal.     Ortho Exam Bilateral knees good range of motion both knees.  No instability valgus varus stressing.  No significant  tenderness along medial lateral joint line of both knees.  Significant patellofemoral crepitus left greater than right.  No abnormal warmth erythema or effusion of either knee. Specialty Comments:  No specialty comments available.  Imaging: XR Knee 1-2 Views Left  Result Date: 04/22/2022 Left knee 2 views: No acute fracture.  Moderate patellofemoral arthritic changes.  Bone-on-bone medial compartment.  Moderate to severe arthritic changes lateral compartment with osteophytes present.  XR Knee 1-2 Views Right  Result Date: 04/22/2022 Right knee 3 views: Knee is well located.  Tricompartmental arthritis with near bone-on-bone medial compartment.  Lateral compartment with moderate to severe arthritic changes and osteophytes off the lateral compartment.  Severe patellofemoral arthritic changes.  No acute fractures.    PMFS  History: Patient Active Problem List   Diagnosis Date Noted   Morbid obesity with BMI of 45.0-49.9, adult 06/21/2018   Bilateral lower extremity edema 06/21/2018   Cellulitis of left lower leg 08/21/2016   Eosinophilia 03/18/2014   Lumbar spinal stenosis    Benign prostatic hyperplasia (BPH) with urinary urge incontinence 08/09/2011   Past Medical History:  Diagnosis Date   Arthritis    Back pain    Cataract    Cellulitis 07/31/2011   Enlarged prostate    Foot abscess, left 08/07/2011   Hypertension    prior to gastric bypass- no meds taken   Lumbar spinal stenosis    Obesity 08/09/2011    Family History  Problem Relation Age of Onset   Heart disease Mother    Cancer Sister        Breast   Breast cancer Sister    Hypertension Brother    Hypertension Maternal Grandmother    Stroke Maternal Grandmother    Heart disease Maternal Grandfather    Hypertension Maternal Grandfather    Diabetes Sister    Heart disease Sister    Hyperlipidemia Sister    Hypertension Sister    Stroke Sister    Diabetes Other    Stroke Other    Hypertension Other    Coronary artery disease Other    Heart failure Other    Colon cancer Neg Hx    Esophageal cancer Neg Hx    Stomach cancer Neg Hx    Rectal cancer Neg Hx     Past Surgical History:  Procedure Laterality Date   APPENDECTOMY     arthroscopic knee surgery     CHOLECYSTECTOMY     I & D EXTREMITY  08/07/2011   Procedure: IRRIGATION AND DEBRIDEMENT EXTREMITY;  Surgeon: Toni Arthurs, MD;  Location: [facility];  Service: Orthopedics;  Laterality: Left;  irrigation and debridement left foot abscess   stomach stapled     Social History   Occupational History   Not on file  Tobacco Use   Smoking status: Never   Smokeless tobacco: Never  Substance and Sexual Activity   Alcohol use: No   Drug use: No   Sexual activity: Yes

## 2022-05-13 ENCOUNTER — Encounter: Payer: Self-pay | Admitting: Physician Assistant

## 2022-05-13 ENCOUNTER — Ambulatory Visit: Payer: Medicare HMO | Admitting: Physician Assistant

## 2022-05-13 VITALS — Ht 70.0 in | Wt 312.6 lb

## 2022-05-13 DIAGNOSIS — M1712 Unilateral primary osteoarthritis, left knee: Secondary | ICD-10-CM

## 2022-05-13 NOTE — Progress Notes (Signed)
HPI: Keith Watkins returns today.  Follow-up status post left knee injection with cortisone 2 weeks ago.  He states overall that the injections helped.  He also obtained inserts for his shoes and feels like this has helped.  Again he has end-stage arthritis of both knees.  However the left knee bothers him the most.  He really does not have a lot of trouble with his right knee.  He does note that the left leg is weak and he has trouble going up and down stairs.  Review of systems: See HPI otherwise negative  Physical exam: Height 5 foot 10 weight 312 pounds  BMI 44.85.  Impression: End-stage left knee arthritis End-stage right knee arthritis  Plan: .  We will continue to work on weight loss.  Will see him back in 3 months for recheck cortisone injection for the right knee at this time he is asymptomatic on the right side.  See him back sooner if there is any questions concerns or

## 2022-07-07 ENCOUNTER — Other Ambulatory Visit: Payer: Self-pay | Admitting: Family Medicine

## 2022-07-07 DIAGNOSIS — N3941 Urge incontinence: Secondary | ICD-10-CM

## 2022-07-14 NOTE — Progress Notes (Signed)
Subjective:   Keith Watkins is a 77 y.o. male who presents for Medicare Annual/Subsequent preventive examination.  Visit Complete: {VISITMETHOD:[phone]}  Patient Medicare AWV questionnaire was completed by the patient on ***; I have confirmed that all information answered by patient is correct and no changes since this date.  Review of Systems    ***       Objective:    There were no vitals filed for this visit. There is no height or weight on file to calculate BMI.     07/02/2021    2:17 PM 05/30/2020    8:58 AM 07/27/2019    8:30 AM 06/21/2018   11:04 AM 06/11/2015    9:53 AM 06/20/2014    9:00 AM 03/18/2014   10:54 AM  Advanced Directives  Does Patient Have a Medical Advance Directive? Yes Yes Yes Yes Yes Yes Yes  Type of Estate agent of [place];Living will Healthcare Power of [place];Living will Healthcare Power of [place];Living will;Out of facility DNR (pink MOST or yellow form) Healthcare Power of [place];Living will Healthcare Power of [place];Living will  Healthcare Power of [place];Living will  Does patient want to make changes to medical advance directive?  No - Patient declined       Copy of Healthcare Power of Attorney in Chart? No - copy requested No - copy requested  No - copy requested No - copy requested  No - copy requested    Current Medications (verified) Outpatient Encounter Medications as of 07/15/2022  Medication Sig   oxybutynin (DITROPAN) 5 MG tablet Take 1 tablet (5 mg total) by mouth 2 (two) times daily.   tamsulosin (FLOMAX) 0.4 MG CAPS capsule TAKE 1 CAPSULE BY MOUTH EVERY DAY   No facility-administered encounter medications on file as of 07/15/2022.    Allergies (verified) Patient has no known allergies.   History: Past Medical History:  Diagnosis Date   Arthritis    Back pain    Cataract    Cellulitis 07/31/2011   Enlarged prostate    Foot abscess, left 08/07/2011   Hypertension    prior to gastric bypass- no  meds taken   Lumbar spinal stenosis    Obesity 08/09/2011   Past Surgical History:  Procedure Laterality Date   APPENDECTOMY     arthroscopic knee surgery     CHOLECYSTECTOMY     I & D EXTREMITY  08/07/2011   Procedure: IRRIGATION AND DEBRIDEMENT EXTREMITY;  Surgeon: Toni Arthurs, MD;  Location: [facility];  Service: Orthopedics;  Laterality: Left;  irrigation and debridement left foot abscess   stomach stapled     Family History  Problem Relation Age of Onset   Heart disease Mother    Cancer Sister        Breast   Breast cancer Sister    Hypertension Brother    Hypertension Maternal Grandmother    Stroke Maternal Grandmother    Heart disease Maternal Grandfather    Hypertension Maternal Grandfather    Diabetes Sister    Heart disease Sister    Hyperlipidemia Sister    Hypertension Sister    Stroke Sister    Diabetes Other    Stroke Other    Hypertension Other    Coronary artery disease Other    Heart failure Other    Colon cancer Neg Hx    Esophageal cancer Neg Hx    Stomach cancer Neg Hx    Rectal cancer Neg Hx    Social History   Socioeconomic  History   Marital status: Married    Spouse name: Britta Mccreedy   Number of children: 3   Years of education: Not on file   Highest education level: Not on file  Occupational History   Not on file  Tobacco Use   Smoking status: Never   Smokeless tobacco: Never  Substance and Sexual Activity   Alcohol use: No   Drug use: No   Sexual activity: Yes  Other Topics Concern   Not on file  Social History Narrative   3 daughters.   Still working.    Social Determinants of Health   Financial Resource Strain: Low Risk  (07/02/2021)   Overall Financial Resource Strain (CARDIA)    Difficulty of Paying Living Expenses: Not hard at all  Food Insecurity: No Food Insecurity (07/02/2021)   Hunger Vital Sign    Worried About Running Out of Food in the Last Year: Never true    Ran Out of Food in the Last Year: Never true  Transportation  Needs: No Transportation Needs (07/02/2021)   PRAPARE - Administrator, Civil Service (Medical): No    Lack of Transportation (Non-Medical): No  Physical Activity: Inactive (07/02/2021)   Exercise Vital Sign    Days of Exercise per Week: 0 days    Minutes of Exercise per Session: 0 min  Stress: No Stress Concern Present (07/02/2021)   [employer] of Occupational Health - Occupational Stress Questionnaire    Feeling of Stress : Not at all  Social Connections: Socially Integrated (07/02/2021)   Social Connection and Isolation Panel [NHANES]    Frequency of Communication with Friends and Family: More than three times a week    Frequency of Social Gatherings with Friends and Family: More than three times a week    Attends Religious Services: More than 4 times per year    Active Member of [employer] or Organizations: Yes    Attends Engineer, structural: More than 4 times per year    Marital Status: Married    Tobacco Counseling Counseling given: Not Answered   Clinical Intake:              How often do you need to have someone help you when you read instructions, pamphlets, or other written materials from your doctor or pharmacy?: (P) 1 - Never         Activities of Daily Living    07/14/2022    5:00 PM  In your present state of health, do you have any difficulty performing the following activities:  Hearing? 0  Vision? 0  Difficulty concentrating or making decisions? 0  Walking or climbing stairs? 1  Dressing or bathing? 0  Doing errands, shopping? 1  Preparing Food and eating ? N  Using the Toilet? N  In the past six months, have you accidently leaked urine? Y  Do you have problems with loss of bowel control? N  Managing your Medications? N  Managing your Finances? N  Housekeeping or managing your Housekeeping? N    Patient Care Team: Donita Brooks, MD as PCP - General (Family Medicine)  Indicate any recent Medical Services you may  have received from other than [facility] providers in the past year (date may be approximate).     Assessment:   This is a routine wellness examination for Keith Watkins.  Hearing/Vision screen No results found.  Dietary issues and exercise activities discussed:     Goals Addressed   None    Depression  Screen    01/12/2022    7:54 AM 07/02/2021    2:09 PM 09/16/2020   11:21 AM 05/30/2020    9:06 AM 07/27/2019    8:34 AM 07/27/2019    8:33 AM 11/21/2018    8:24 AM  PHQ 2/9 Scores  PHQ - 2 Score 0 0 0 0 0 0 0    Fall Risk    07/14/2022    5:00 PM 01/12/2022    7:54 AM 07/02/2021    2:10 PM 07/01/2021    3:46 PM 09/16/2020   11:21 AM  Fall Risk   Falls in the past year? 0 1 1 1  0  Number falls in past yr:  1 0 0 0  Injury with Fall? 0 1 1 1  0  Risk for fall due to :  History of fall(s);Impaired balance/gait;Orthopedic patient History of fall(s);Impaired balance/gait    Follow up  Education provided;Falls prevention discussed Falls prevention discussed      MEDICARE RISK AT HOME:   TIMED UP AND GO:  Was the test performed?  No    Cognitive Function:        07/02/2021    2:17 PM 07/27/2019    8:37 AM  6CIT Screen  What Year? 0 points 0 points  What month? 0 points 0 points  What time? 0 points 0 points  Count back from 20 0 points 0 points  Months in reverse 0 points 0 points  Repeat phrase 0 points 0 points  Total Score 0 points 0 points    Immunizations Immunization History  Administered Date(s) Administered   Fluad Quad(high Dose 65+) 09/27/2018, 10/02/2021   Influenza,inj,Quad PF,6+ Mos 02/06/2013, 02/14/2014   Influenza-Unspecified 01/20/2017   PFIZER Comirnaty(Gray Top)Covid-19 Tri-Sucrose Vaccine 10/02/2021   PFIZER(Purple Top)SARS-COV-2 Vaccination 02/17/2019, 03/14/2019   Pneumococcal Conjugate-13 02/06/2013   Pneumococcal Polysaccharide-23 08/09/2011   Respiratory Syncytial Virus Vaccine,Recomb Aduvanted(Arexvy) 10/02/2021    TDAP status: Due, Education has  been provided regarding the importance of this vaccine. Advised may receive this vaccine at local pharmacy or Health Dept. Aware to provide a copy of the vaccination record if obtained from local pharmacy or Health Dept. Verbalized acceptance and understanding.  Pneumococcal vaccine status: Up to date  Covid-19 vaccine status: Information provided on how to obtain vaccines.   Qualifies for Shingles Vaccine? Yes   Zostavax completed No   Shingrix Completed?: No.    Education has been provided regarding the importance of this vaccine. Patient has been advised to call insurance company to determine out of pocket expense if they have not yet received this vaccine. Advised may also receive vaccine at local pharmacy or Health Dept. Verbalized acceptance and understanding.  Screening Tests Health Maintenance  Topic Date Due   Zoster Vaccines- Shingrix (1 of 2) Never done   COVID-19 Vaccine (4 - 2023-24 season) 11/27/2021   Medicare Annual Wellness (AWV)  07/03/2022   INFLUENZA VACCINE  08/05/2022   Pneumonia Vaccine 77+ Years old  Completed   Hepatitis C Screening  Completed   HPV VACCINES  Aged Out   DTaP/Tdap/Td  Discontinued   Colonoscopy  Discontinued    Health Maintenance  Health Maintenance Due  Topic Date Due   Zoster Vaccines- Shingrix (1 of 2) Never done   COVID-19 Vaccine (4 - 2023-24 season) 11/27/2021   Medicare Annual Wellness (AWV)  07/03/2022    Colorectal cancer screening: No longer required.   Lung Cancer Screening: (Low Dose CT Chest recommended if Age 18-80 years, 20 pack-year  currently smoking OR have quit w/in 15years.) does not qualify.   Lung Cancer Screening Referral: n/a  Additional Screening:  Hepatitis C Screening: does qualify; Completed 04/15/16  Vision Screening: Recommended annual ophthalmology exams for early detection of glaucoma and other disorders of the eye. Is the patient up to date with their annual eye exam?  {YES/NO:21197} Who is the  provider or what is the name of the office in which the patient attends annual eye exams? *** If pt is not established with a provider, would they like to be referred to a provider to establish care? {YES/NO:21197}.   Dental Screening: Recommended annual dental exams for proper oral hygiene  Community Resource Referral / Chronic Care Management: CRR required this visit?  {YES/NO:21197}  CCM required this visit?  {CCM Required choices:[phone]}     Plan:     I have personally reviewed and noted the following in the patient's chart:   Medical and social history Use of alcohol, tobacco or illicit drugs  Current medications and supplements including opioid prescriptions. {Opioid [phone]} Functional ability and status Nutritional status Physical activity Advanced directives List of other physicians Hospitalizations, surgeries, and ER visits in previous 12 months Vitals Screenings to include cognitive, depression, and falls Referrals and appointments  In addition, I have reviewed and discussed with patient certain preventive protocols, quality metrics, and best practice recommendations. A written personalized care plan for preventive services as well as general preventive health recommendations were provided to patient.     Kandis Fantasia [place], [place]   1/61/0960   After Visit Summary: {CHL AMB AWV After Visit Summary:[phone]}  Nurse Notes: ***

## 2022-07-14 NOTE — Patient Instructions (Incomplete)
Keith Watkins , Thank you for taking time to come for your Medicare Wellness Visit. I appreciate your ongoing commitment to your health goals. Please review the following plan we discussed and let me know if I can assist you in the future.   These are the goals we discussed:  Goals      Patient Stated     I would like to get my left knee repaired      Weight (lb) < 300 lb (136.1 kg)        This is a list of the screening recommended for you and due dates:  Health Maintenance  Topic Date Due   Zoster (Shingles) Vaccine (1 of 2) Never done   COVID-19 Vaccine (4 - 2023-24 season) 11/27/2021   Medicare Annual Wellness Visit  07/03/2022   Flu Shot  08/05/2022   Pneumonia Vaccine  Completed   Hepatitis C Screening  Completed   HPV Vaccine  Aged Out   DTaP/Tdap/Td vaccine  Discontinued   Colon Cancer Screening  Discontinued    Advanced directives: Information on Advanced Care Planning can be found at [facility] Advance Health Care Directives Advance Health Care Directives ([email or web address] Please bring a copy of your health care power of attorney and living will to the office to be added to your chart at your convenience.  Conditions/risks identified: Aim for 30 minutes of exercise or brisk walking, 6-8 glasses of water, and 5 servings of fruits and vegetables each day.  Next appointment: Follow up in one year for your annual wellness visit.   Preventive Care 77 Years and Older, Male  Preventive care refers to lifestyle choices and visits with your health care provider that can promote health and wellness. What does preventive care include? A yearly physical exam. This is also called an annual well check. Dental exams once or twice a year. Routine eye exams. Ask your health care provider how often you should have your eyes checked. Personal lifestyle choices, including: Daily care of your teeth and gums. Regular physical activity. Eating a healthy diet. Avoiding  tobacco and drug use. Limiting alcohol use. Practicing safe sex. Taking low doses of aspirin every day. Taking vitamin and mineral supplements as recommended by your health care provider. What happens during an annual well check? The services and screenings done by your health care provider during your annual well check will depend on your age, overall health, lifestyle risk factors, and family history of disease. Counseling  Your health care provider may ask you questions about your: Alcohol use. Tobacco use. Drug use. Emotional well-being. Home and relationship well-being. Sexual activity. Eating habits. History of falls. Memory and ability to understand (cognition). Work and work Astronomer. Screening  You may have the following tests or measurements: Height, weight, and BMI. Blood pressure. Lipid and cholesterol levels. These may be checked every 5 years, or more frequently if you are over 77 years old. Skin check. Lung cancer screening. You may have this screening every year starting at age 77 if you have a 30-pack-year history of smoking and currently smoke or have quit within the past 15 years. Fecal occult blood test (FOBT) of the stool. You may have this test every year starting at age 77. Flexible sigmoidoscopy or colonoscopy. You may have a sigmoidoscopy every 5 years or a colonoscopy every 10 years starting at age 77. Prostate cancer screening. Recommendations will vary depending on your family history and other risks. Hepatitis C blood test. Hepatitis B blood  test. Sexually transmitted disease (STD) testing. Diabetes screening. This is done by checking your blood sugar (glucose) after you have not eaten for a while (fasting). You may have this done every 1-3 years. Abdominal aortic aneurysm (AAA) screening. You may need this if you are a current or former smoker. Osteoporosis. You may be screened starting at age 77 if you are at high risk. Talk with your health care  provider about your test results, treatment options, and if necessary, the need for more tests. Vaccines  Your health care provider may recommend certain vaccines, such as: Influenza vaccine. This is recommended every year. Tetanus, diphtheria, and acellular pertussis (Tdap, Td) vaccine. You may need a Td booster every 10 years. Zoster vaccine. You may need this after age 77. Pneumococcal 13-valent conjugate (PCV13) vaccine. One dose is recommended after age 77. Pneumococcal polysaccharide (PPSV23) vaccine. One dose is recommended after age 77. Talk to your health care provider about which screenings and vaccines you need and how often you need them. This information is not intended to replace advice given to you by your health care provider. Make sure you discuss any questions you have with your health care provider. Document Released: 01/17/2015 Document Revised: 09/10/2015 Document Reviewed: 10/22/2014 Elsevier Interactive Patient Education  2017 [employer].  Fall Prevention in the Home Falls can cause injuries. They can happen to people of all ages. There are many things you can do to make your home safe and to help prevent falls. What can I do on the outside of my home? Regularly fix the edges of walkways and driveways and fix any cracks. Remove anything that might make you trip as you walk through a door, such as a raised step or threshold. Trim any bushes or trees on the path to your home. Use bright outdoor lighting. Clear any walking paths of anything that might make someone trip, such as rocks or tools. Regularly check to see if handrails are loose or broken. Make sure that both sides of any steps have handrails. Any raised decks and porches should have guardrails on the edges. Have any leaves, snow, or ice cleared regularly. Use sand or salt on walking paths during winter. Clean up any spills in your garage right away. This includes oil or grease spills. What can I do in the  bathroom? Use night lights. Install grab bars by the toilet and in the tub and shower. Do not use towel bars as grab bars. Use non-skid mats or decals in the tub or shower. If you need to sit down in the shower, use a plastic, non-slip stool. Keep the floor dry. Clean up any water that spills on the floor as soon as it happens. Remove soap buildup in the tub or shower regularly. Attach bath mats securely with double-sided non-slip rug tape. Do not have throw rugs and other things on the floor that can make you trip. What can I do in the bedroom? Use night lights. Make sure that you have a light by your bed that is easy to reach. Do not use any sheets or blankets that are too big for your bed. They should not hang down onto the floor. Have a firm chair that has side arms. You can use this for support while you get dressed. Do not have throw rugs and other things on the floor that can make you trip. What can I do in the kitchen? Clean up any spills right away. Avoid walking on wet floors. Keep items that  you use a lot in easy-to-reach places. If you need to reach something above you, use a strong step stool that has a grab bar. Keep electrical cords out of the way. Do not use floor polish or wax that makes floors slippery. If you must use wax, use non-skid floor wax. Do not have throw rugs and other things on the floor that can make you trip. What can I do with my stairs? Do not leave any items on the stairs. Make sure that there are handrails on both sides of the stairs and use them. Fix handrails that are broken or loose. Make sure that handrails are as long as the stairways. Check any carpeting to make sure that it is firmly attached to the stairs. Fix any carpet that is loose or worn. Avoid having throw rugs at the top or bottom of the stairs. If you do have throw rugs, attach them to the floor with carpet tape. Make sure that you have a light switch at the top of the stairs and the  bottom of the stairs. If you do not have them, ask someone to add them for you. What else can I do to help prevent falls? Wear shoes that: Do not have high heels. Have rubber bottoms. Are comfortable and fit you well. Are closed at the toe. Do not wear sandals. If you use a stepladder: Make sure that it is fully opened. Do not climb a closed stepladder. Make sure that both sides of the stepladder are locked into place. Ask someone to hold it for you, if possible. Clearly mark and make sure that you can see: Any grab bars or handrails. First and last steps. Where the edge of each step is. Use tools that help you move around (mobility aids) if they are needed. These include: Canes. Walkers. Scooters. Crutches. Turn on the lights when you go into a dark area. Replace any light bulbs as soon as they burn out. Set up your furniture so you have a clear path. Avoid moving your furniture around. If any of your floors are uneven, fix them. If there are any pets around you, be aware of where they are. Review your medicines with your doctor. Some medicines can make you feel dizzy. This can increase your chance of falling. Ask your doctor what other things that you can do to help prevent falls. This information is not intended to replace advice given to you by your health care provider. Make sure you discuss any questions you have with your health care provider. Document Released: 10/17/2008 Document Revised: 05/29/2015 Document Reviewed: 01/25/2014 Elsevier Interactive Patient Education  2017 [employer].

## 2022-07-15 ENCOUNTER — Ambulatory Visit (INDEPENDENT_AMBULATORY_CARE_PROVIDER_SITE_OTHER): Payer: Medicare HMO

## 2022-07-15 VITALS — Ht 70.0 in | Wt 312.0 lb

## 2022-07-15 DIAGNOSIS — Z0000 Encounter for general adult medical examination without abnormal findings: Secondary | ICD-10-CM

## 2022-08-08 ENCOUNTER — Other Ambulatory Visit: Payer: Self-pay | Admitting: Family Medicine

## 2022-08-08 DIAGNOSIS — N3941 Urge incontinence: Secondary | ICD-10-CM

## 2022-08-12 ENCOUNTER — Other Ambulatory Visit: Payer: Self-pay | Admitting: Family Medicine

## 2022-08-12 DIAGNOSIS — N3941 Urge incontinence: Secondary | ICD-10-CM

## 2022-08-13 NOTE — Telephone Encounter (Signed)
Future OV scheduled 09/10/22.  Requested Prescriptions  Pending Prescriptions Disp Refills   tamsulosin (FLOMAX) 0.4 MG CAPS capsule [Pharmacy Med Name: TAMSULOSIN HCL 0.4 MG CAPSULE] 90 capsule 0    Sig: TAKE 1 CAPSULE BY MOUTH EVERY DAY     Urology: Alpha-Adrenergic Blocker Failed - 08/12/2022  2:45 AM      Failed - Valid encounter within last 12 months    Recent Outpatient Visits           1 year ago Leg swelling   [facility] Donita Brooks, MD   1 year ago Abscess of left lower leg   [facility] Tanya Nones, Priscille Heidelberg, MD   1 year ago Abscess of left lower leg   [facility] Tanya Nones, Priscille Heidelberg, MD   1 year ago Abscess of left lower leg   [facility] Tanya Nones, Priscille Heidelberg, MD   1 year ago Abscess of left lower leg   [facility] Pickard, Priscille Heidelberg, MD       Future Appointments             In 3 days Kathryne Hitch, MD [facility]   In 4 weeks Donita Brooks, MD [facility]            Passed - PSA in normal range and within 360 days    PSA  Date Value Ref Range Status  01/07/2022 0.31 < OR = 4.00 ng/mL Final    Comment:    The total PSA value from this assay system is  standardized against the WHO standard. The test  result will be approximately 20% lower when compared  to the equimolar-standardized total PSA (Beckman  Coulter). Comparison of serial PSA results should be  interpreted with this fact in mind. . This test was performed using the Siemens  chemiluminescent method. Values obtained from  different assay methods cannot be used interchangeably. PSA levels, regardless of value, should not be interpreted as absolute evidence of the presence or absence of disease.          Passed - Last BP in normal range    BP Readings from Last 1 Encounters:  01/12/22 128/74

## 2022-08-16 ENCOUNTER — Encounter: Payer: Self-pay | Admitting: Orthopaedic Surgery

## 2022-08-16 ENCOUNTER — Ambulatory Visit: Payer: Medicare HMO | Admitting: Orthopaedic Surgery

## 2022-08-16 ENCOUNTER — Other Ambulatory Visit: Payer: Self-pay | Admitting: Family Medicine

## 2022-08-16 VITALS — Ht 70.0 in | Wt 291.0 lb

## 2022-08-16 DIAGNOSIS — M25562 Pain in left knee: Secondary | ICD-10-CM | POA: Diagnosis not present

## 2022-08-16 DIAGNOSIS — M1712 Unilateral primary osteoarthritis, left knee: Secondary | ICD-10-CM | POA: Diagnosis not present

## 2022-08-16 DIAGNOSIS — G8929 Other chronic pain: Secondary | ICD-10-CM | POA: Diagnosis not present

## 2022-08-16 DIAGNOSIS — N3941 Urge incontinence: Secondary | ICD-10-CM

## 2022-08-16 NOTE — Progress Notes (Signed)
The patient is a 77 year old gentleman well-known to [place].  He has been on a weight loss journey as a relates to his morbid obesity.  He also has severe arthritis in both his knees but the left knee is the one that is way more symptomatic for him.  He said steroid injections and only lasted a short amount of time.  Since we saw him last, he has lost almost 20 pounds.  His BMI is now down to 41.75.  It was 44.85 3 months ago.  His left knee has varus malalignment that is correctable.  It is ligamentously stable but has tenderness along the medial and lateral joint lines in the patellofemoral joint.  It is in need of a knee replacement.  There is not a large soft tissue envelope around his knees and there is no significant venous stasis changes in his legs.  Unfortunately he does need to lose a little bit more weight before we can proceed with surgery.  I do feel that if we are able to replace his knee he would continue to lose weight but we are bound by the community standard of being a BMI below 40.  I would like to see him back in 3 months and will have a repeat weight and BMI calculation.  If he has lost more weight, we can even see if there is an appeal process to proceed with knee replacement surgery.  He understands this as well.

## 2022-08-17 NOTE — Telephone Encounter (Signed)
Requested Prescriptions  Pending Prescriptions Disp Refills   oxybutynin (DITROPAN) 5 MG tablet [Pharmacy Med Name: OXYBUTYNIN 5 MG TABLET] 180 tablet 1    Sig: TAKE 1 TABLET BY MOUTH TWICE A DAY     Urology:  Bladder Agents Failed - 08/16/2022  2:37 AM      Failed - Valid encounter within last 12 months    Recent Outpatient Visits           1 year ago Leg swelling   [facility] Donita Brooks, MD   1 year ago Abscess of left lower leg   [facility] Tanya Nones Priscille Heidelberg, MD   1 year ago Abscess of left lower leg   [facility] Tanya Nones Priscille Heidelberg, MD   1 year ago Abscess of left lower leg   [facility] Donita Brooks, MD   1 year ago Abscess of left lower leg   [facility] Pickard, Priscille Heidelberg, MD       Future Appointments             In 2 weeks Pickard, Priscille Heidelberg, MD [facility]   In 3 months Magnus Ivan, Vanita Panda, MD [facility]

## 2022-09-02 ENCOUNTER — Ambulatory Visit (INDEPENDENT_AMBULATORY_CARE_PROVIDER_SITE_OTHER): Payer: Medicare HMO | Admitting: Family Medicine

## 2022-09-02 ENCOUNTER — Encounter: Payer: Self-pay | Admitting: Family Medicine

## 2022-09-02 VITALS — BP 132/80 | HR 86 | Temp 97.6°F | Ht 70.0 in | Wt 300.8 lb

## 2022-09-02 DIAGNOSIS — Z0001 Encounter for general adult medical examination with abnormal findings: Secondary | ICD-10-CM | POA: Diagnosis not present

## 2022-09-02 DIAGNOSIS — N3941 Urge incontinence: Secondary | ICD-10-CM | POA: Diagnosis not present

## 2022-09-02 DIAGNOSIS — N401 Enlarged prostate with lower urinary tract symptoms: Secondary | ICD-10-CM

## 2022-09-02 DIAGNOSIS — Z6841 Body Mass Index (BMI) 40.0 and over, adult: Secondary | ICD-10-CM | POA: Diagnosis not present

## 2022-09-02 DIAGNOSIS — Z23 Encounter for immunization: Secondary | ICD-10-CM

## 2022-09-02 DIAGNOSIS — Z0000 Encounter for general adult medical examination without abnormal findings: Secondary | ICD-10-CM

## 2022-09-02 MED ORDER — OXYBUTYNIN CHLORIDE ER 10 MG PO TB24: 10.0000 mg | ORAL_TABLET | Freq: Two times a day (BID) | ORAL | 3 refills | Status: DC

## 2022-09-02 NOTE — Addendum Note (Signed)
Addended by: Venia Carbon K on: 09/02/2022 10:42 AM   Modules accepted: Orders

## 2022-09-02 NOTE — Progress Notes (Signed)
Subjective:    Patient ID: Keith Watkins, male    DOB: 03-11-1945, 77 y.o.   MRN: [id number]  HPI Patient is a 77 year old Caucasian gentleman with a history of morbid obesity here for CPE.  PSA was normal in 1/24.  Colonoscopy was 2016 and recommended repeat in 2026.   Immunization History  Administered Date(s) Administered   Fluad Quad(high Dose 65+) 09/27/2018, 10/02/2021   Influenza,inj,Quad PF,6+ Mos 02/06/2013, 02/14/2014   Influenza-Unspecified 01/20/2017   PFIZER Comirnaty(Gray Top)Covid-19 Tri-Sucrose Vaccine 10/02/2021   PFIZER(Purple Top)SARS-COV-2 Vaccination 02/17/2019, 03/14/2019   Pneumococcal Conjugate-13 02/06/2013   Pneumococcal Polysaccharide-23 08/09/2011   Respiratory Syncytial Virus Vaccine,Recomb Aduvanted(Arexvy) 10/02/2021   Zoster, Live 03/04/2014   Patient is due for the shingles vaccine.  He is also due for a flu shot and a COVID shot this fall.  He denies any memory loss or falls or depression.  He has severe knee pain.  They will not do his knee replacement until he is 275 pounds.  He is 300 today.  He denies any chest pain or shortness of breath. Past Medical History:  Diagnosis Date   Arthritis    Back pain    Cataract    Cellulitis 07/31/2011   Enlarged prostate    Foot abscess, left 08/07/2011   Hypertension    prior to gastric bypass- no meds taken   Lumbar spinal stenosis    Obesity 08/09/2011   Past Surgical History:  Procedure Laterality Date   APPENDECTOMY     arthroscopic knee surgery     CHOLECYSTECTOMY     I & D EXTREMITY  08/07/2011   Procedure: IRRIGATION AND DEBRIDEMENT EXTREMITY;  Surgeon: Toni Arthurs, MD;  Location: [facility];  Service: Orthopedics;  Laterality: Left;  irrigation and debridement left foot abscess   stomach stapled     Current Outpatient Medications on File Prior to Visit  Medication Sig Dispense Refill   oxybutynin (DITROPAN) 5 MG tablet TAKE 1 TABLET BY MOUTH TWICE A DAY 180 tablet 1   tamsulosin (FLOMAX) 0.4 MG CAPS  capsule TAKE 1 CAPSULE BY MOUTH EVERY DAY 90 capsule 0   No current facility-administered medications on file prior to visit.   No Known Allergies Social History   Socioeconomic History   Marital status: Married    Spouse name: Britta Mccreedy   Number of children: 3   Years of education: Not on file   Highest education level: Associate degree: occupational, Scientist, product/process development, or vocational program  Occupational History   Not on file  Tobacco Use   Smoking status: Never   Smokeless tobacco: Never  Substance and Sexual Activity   Alcohol use: No   Drug use: No   Sexual activity: Yes  Other Topics Concern   Not on file  Social History Narrative   3 daughters.   Still working.    Social Determinants of Health   Financial Resource Strain: Low Risk  (08/29/2022)   Overall Financial Resource Strain (CARDIA)    Difficulty of Paying Living Expenses: Not hard at all  Food Insecurity: No Food Insecurity (08/29/2022)   Hunger Vital Sign    Worried About Running Out of Food in the Last Year: Never true    Ran Out of Food in the Last Year: Never true  Transportation Needs: No Transportation Needs (08/29/2022)   PRAPARE - Administrator, Civil Service (Medical): No    Lack of Transportation (Non-Medical): No  Physical Activity: Inactive (08/29/2022)   Exercise Vital Sign  Days of Exercise per Week: 0 days    Minutes of Exercise per Session: 0 min  Stress: No Stress Concern Present (08/29/2022)   [employer] of Occupational Health - Occupational Stress Questionnaire    Feeling of Stress : Not at all  Social Connections: Socially Integrated (08/29/2022)   Social Connection and Isolation Panel [NHANES]    Frequency of Communication with Friends and Family: More than three times a week    Frequency of Social Gatherings with Friends and Family: Three times a week    Attends Religious Services: More than 4 times per year    Active Member of Clubs or Organizations: Yes    Attends Occupational hygienist Meetings: More than 4 times per year    Marital Status: Married  Catering manager Violence: Not At Risk (07/15/2022)   Humiliation, Afraid, Rape, and Kick questionnaire    Fear of Current or Ex-Partner: No    Emotionally Abused: No    Physically Abused: No    Sexually Abused: No      Review of Systems  All other systems reviewed and are negative.      Objective:   Physical Exam Vitals reviewed.  Constitutional:      Appearance: He is obese.  Cardiovascular:     Rate and Rhythm: Normal rate and regular rhythm.     Heart sounds: Normal heart sounds.  Pulmonary:     Effort: Pulmonary effort is normal. No respiratory distress.     Breath sounds: Normal breath sounds. No wheezing.  Abdominal:     General: Bowel sounds are normal. There is no distension.     Tenderness: There is no abdominal tenderness. There is no guarding.  Musculoskeletal:     Right lower leg: Edema present.     Left lower leg: Edema present.  Skin:    Findings: No erythema.  Neurological:     Mental Status: He is alert.           Assessment & Plan:  General medical exam - Plan: CBC with Differential/Platelet, COMPLETE METABOLIC PANEL WITH GFR, Lipid panel, Hemoglobin A1c, TSH  Benign prostatic hyperplasia (BPH) with urinary urge incontinence  Morbid obesity with BMI of 45.0-49.9, adult (HCC) - Plan: CBC with Differential/Platelet, COMPLETE METABOLIC PANEL WITH GFR, Lipid panel, Hemoglobin A1c, TSH Recommended diet and exercise however his exercise is limited due to to his knee pain.  Recommended trying Ozempic 0.5 mg subcu weekly and uptitrate monthly as tolerated.  Check CBC CMP lipid panel A1c and TSH.  Patient received his shingles vaccine today.  Recommended a flu shot and a COVID shot.  Patient denies any depression or falls or memory loss.

## 2022-09-03 LAB — CBC WITH DIFFERENTIAL/PLATELET
Absolute Monocytes: 492 {cells}/uL (ref 200–950)
Basophils Absolute: 59 {cells}/uL (ref 0–200)
Basophils Relative: 1.6 %
Eosinophils Absolute: 492 {cells}/uL (ref 15–500)
Eosinophils Relative: 13.3 %
HCT: 44 % (ref 38.5–50.0)
Hemoglobin: 14.7 g/dL (ref 13.2–17.1)
Lymphs Abs: 1166 {cells}/uL (ref 850–3900)
MCH: 30.7 pg (ref 27.0–33.0)
MCHC: 33.4 g/dL (ref 32.0–36.0)
MCV: 91.9 fL (ref 80.0–100.0)
MPV: 10.7 fL (ref 7.5–12.5)
Monocytes Relative: 13.3 %
Neutro Abs: 1491 {cells}/uL — ABNORMAL LOW (ref 1500–7800)
Neutrophils Relative %: 40.3 %
Platelets: 192 10*3/uL (ref 140–400)
RBC: 4.79 10*6/uL (ref 4.20–5.80)
RDW: 12.7 % (ref 11.0–15.0)
Total Lymphocyte: 31.5 %
WBC: 3.7 10*3/uL — ABNORMAL LOW (ref 3.8–10.8)

## 2022-09-03 LAB — LIPID PANEL
Cholesterol: 108 mg/dL (ref <200)
HDL: 32 mg/dL — ABNORMAL LOW (ref >=40)
LDL Cholesterol (Calc): 61 mg/dL
Non-HDL Cholesterol (Calc): 76 mg/dL (ref <130)
Total CHOL/HDL Ratio: 3.4 (calc) (ref <5.0)
Triglycerides: 72 mg/dL (ref <150)

## 2022-09-03 LAB — COMPLETE METABOLIC PANEL WITH GFR
AG Ratio: 1.1 (calc) (ref 1.0–2.5)
ALT: 9 U/L (ref 9–46)
AST: 18 U/L (ref 10–35)
Albumin: 3 g/dL — ABNORMAL LOW (ref 3.6–5.1)
Alkaline phosphatase (APISO): 60 U/L (ref 35–144)
BUN: 14 mg/dL (ref 7–25)
CO2: 28 mmol/L (ref 20–32)
Calcium: 8.8 mg/dL (ref 8.6–10.3)
Chloride: 106 mmol/L (ref 98–110)
Creat: 0.9 mg/dL (ref 0.70–1.28)
Globulin: 2.7 g/dL (ref 1.9–3.7)
Glucose, Bld: 94 mg/dL (ref 65–99)
Potassium: 4.6 mmol/L (ref 3.5–5.3)
Sodium: 142 mmol/L (ref 135–146)
Total Bilirubin: 1.1 mg/dL (ref 0.2–1.2)
Total Protein: 5.7 g/dL — ABNORMAL LOW (ref 6.1–8.1)
eGFR: 89 mL/min/{1.73_m2} (ref >=60)

## 2022-09-03 LAB — HEMOGLOBIN A1C
Hgb A1c MFr Bld: 5.2 %{Hb} (ref <5.7)
Mean Plasma Glucose: 103 mg/dL
eAG (mmol/L): 5.7 mmol/L

## 2022-09-03 LAB — TSH: TSH: 2.77 m[IU]/L (ref 0.40–4.50)

## 2022-09-10 ENCOUNTER — Encounter: Payer: Medicare HMO | Admitting: Family Medicine

## 2022-11-02 ENCOUNTER — Ambulatory Visit (INDEPENDENT_AMBULATORY_CARE_PROVIDER_SITE_OTHER): Payer: Medicare HMO

## 2022-11-02 DIAGNOSIS — Z23 Encounter for immunization: Secondary | ICD-10-CM

## 2022-11-02 NOTE — Progress Notes (Signed)
Pt came in for 2nd shingles vaccine injection. Injection given on the L-upper arm. Pt tol injection well w/no c/o. Pt left w/no c/o

## 2022-11-09 ENCOUNTER — Ambulatory Visit: Payer: Medicare HMO | Admitting: Family Medicine

## 2022-11-13 ENCOUNTER — Other Ambulatory Visit: Payer: Self-pay | Admitting: Family Medicine

## 2022-11-13 DIAGNOSIS — N3941 Urge incontinence: Secondary | ICD-10-CM

## 2022-11-15 NOTE — Telephone Encounter (Signed)
Requested medication (s) are due for refill today: yes   Requested medication (s) are on the active medication list: yes   Last refill:  08/13/22 #90 0 refills  Future visit scheduled: no   Notes to clinic:  last OV 09/02/22 . No refills remain. Do you want to refill Rx?     Requested Prescriptions  Pending Prescriptions Disp Refills   tamsulosin (FLOMAX) 0.4 MG CAPS capsule [Pharmacy Med Name: TAMSULOSIN HCL 0.4 MG CAPSULE] 90 capsule 0    Sig: TAKE 1 CAPSULE BY MOUTH EVERY DAY     Urology: Alpha-Adrenergic Blocker Failed - 11/13/2022  8:52 AM      Failed - Valid encounter within last 12 months    Recent Outpatient Visits           2 years ago Leg swelling   [facility] Donita Brooks, MD   2 years ago Abscess of left lower leg   [facility] Donita Brooks, MD   2 years ago Abscess of left lower leg   [facility] Donita Brooks, MD   2 years ago Abscess of left lower leg   [facility] Donita Brooks, MD   2 years ago Abscess of left lower leg   [facility] Pickard, Priscille Heidelberg, MD       Future Appointments             In 2 days Kathryne Hitch, MD [facility]            Passed - PSA in normal range and within 360 days    PSA  Date Value Ref Range Status  01/07/2022 0.31 < OR = 4.00 ng/mL Final    Comment:    The total PSA value from this assay system is  standardized against the WHO standard. The test  result will be approximately 20% lower when compared  to the equimolar-standardized total PSA (Beckman  Coulter). Comparison of serial PSA results should be  interpreted with this fact in mind. . This test was performed using the Siemens  chemiluminescent method. Values obtained from  different assay methods cannot be used interchangeably. PSA levels, regardless of value, should not be interpreted as absolute evidence of the presence or  absence of disease.          Passed - Last BP in normal range    BP Readings from Last 1 Encounters:  09/02/22 132/80

## 2022-11-17 ENCOUNTER — Encounter: Payer: Self-pay | Admitting: Orthopaedic Surgery

## 2022-11-17 ENCOUNTER — Ambulatory Visit: Payer: Medicare HMO | Admitting: Orthopaedic Surgery

## 2022-11-17 VITALS — Ht 70.0 in | Wt 283.0 lb

## 2022-11-17 DIAGNOSIS — M25562 Pain in left knee: Secondary | ICD-10-CM

## 2022-11-17 DIAGNOSIS — M1712 Unilateral primary osteoarthritis, left knee: Secondary | ICD-10-CM

## 2022-11-17 DIAGNOSIS — G8929 Other chronic pain: Secondary | ICD-10-CM

## 2022-11-17 NOTE — Progress Notes (Signed)
The patient continues on his weight loss journey.  He is 77 years old and is in desperate need of a knee replacement.  His BMI earlier this year was 44.85.  When I saw him in August of this year his BMI was down to 41.75.  He continues to lose weight.  Today his BMI is 40.61.  Unfortunately given the insurance parameters and community standard parameters, I cannot still put him on the OR schedule.  His BMI needs to be below 40.  He understands that as well.  I think it is going to happen.  On examination of his right and left knee they have both have varus malalignment that is correctable.  He does not have a large soft tissue envelope around either knee.  He does still ambit with a cane as well.  I would like to now only see him back in just 4 weeks to see if we can continue to push his weight loss and get him below 40 BMI.  If it is very cold and he is in heavy close we want him strapped down and doing everything we can to help him obtain the correct number so we can successfully schedule him for knee replacement.  He understands this as well.

## 2022-12-01 ENCOUNTER — Other Ambulatory Visit: Payer: Self-pay

## 2022-12-01 ENCOUNTER — Telehealth: Payer: Self-pay

## 2022-12-01 DIAGNOSIS — N401 Enlarged prostate with lower urinary tract symptoms: Secondary | ICD-10-CM

## 2022-12-01 MED ORDER — TAMSULOSIN HCL 0.4 MG PO CAPS
0.4000 mg | ORAL_CAPSULE | Freq: Every day | ORAL | 0 refills | Status: DC
Start: 2022-12-01 — End: 2023-02-25

## 2022-12-01 NOTE — Telephone Encounter (Signed)
Copied from CRM [id number]. Topic: Clinical - Medication Refill >> Dec 01, 2022  2:24 PM Clayton Bibles wrote: Most Recent Primary Care Visit:  Provider: Arta Silence  Department: [facility]  Visit Type: NURSE VISIT  Date: 11/02/2022  Medication:  Tamsulosin   Has the patient contacted their pharmacy? No (Agent: If no, request that the patient contact the pharmacy for the refill. If patient does not wish to contact the pharmacy document the reason why and proceed with request.) (Agent: If yes, when and what did the pharmacy advise?)  Is this the correct pharmacy for this prescription? Yes - [facility] If no, delete pharmacy and type the correct one.  This is the patient's preferred pharmacy:  [facility] - [place], [place] - [id number], [address] [place] [address] Phone: [phone] Fax: [phone]  [id number] - [address] [place] - [address] [place] [address] Phone: [phone] Fax: [phone]   Has the prescription been filled recently? Yes - last month  Is the patient out of the medication? Yes  Has the patient been seen for an appointment in the last year OR does the patient have an upcoming appointment? Yes  Can we respond through MyChart? Yes and you can him too   Agent: Please be advised that Rx refills may take up to 3 business days. We ask that you follow-up with your pharmacy.

## 2022-12-15 ENCOUNTER — Ambulatory Visit: Payer: Medicare HMO | Admitting: Orthopaedic Surgery

## 2022-12-15 VITALS — Ht 70.0 in | Wt 275.0 lb

## 2022-12-15 DIAGNOSIS — M25562 Pain in left knee: Secondary | ICD-10-CM

## 2022-12-15 DIAGNOSIS — G8929 Other chronic pain: Secondary | ICD-10-CM | POA: Diagnosis not present

## 2022-12-15 DIAGNOSIS — M1712 Unilateral primary osteoarthritis, left knee: Secondary | ICD-10-CM | POA: Diagnosis not present

## 2022-12-15 NOTE — Progress Notes (Signed)
The patient is a 77 year old gentleman well-known to me.  He has severe debilitating arthritis of his left knee and this has been well-documented with clinical exam and x-ray findings showing bone-on-bone wear.  He has had multiple injections of steroid over the years and does ambulate with a cane.  What is kept him out of surgery has been his morbid obesity.  Earlier this year his BMI was 45.  Today in the office his BMI is down to 39.46.  He still deals with debilitating daily left knee pain.  His left knee is weaker to him.  It is detrimentally affecting his mobility, his quality of life and his actives daily living.  On numerous visits we talked about knee replacement surgery.  At this point he is ready to proceed with surgery as are we given his weight loss.  Examination of his left knee shows varus malalignment that is correctable.  There is significant medial joint line tenderness and patellofemoral crepitation and pain.  There is no evidence of cellulitis.  There is a mild effusion.  Previous x-rays again of his left knee show severe end-stage arthritis with bone-on-bone wear the medial compartment and patellofemoral joint.  There are osteophytes in all 3 compartments.  This point we will work on getting him on the schedule for a left total knee arthroplasty.  He will continue his weight loss journey in the interim.  He will continue his cane and quad strengthening exercises while we work on getting him scheduled.  All questions and concerns were answered and addressed.  We will be in touch.

## 2023-01-17 ENCOUNTER — Other Ambulatory Visit: Payer: Self-pay

## 2023-01-26 NOTE — Pre-Procedure Instructions (Signed)
Surgical Instructions   Your procedure is scheduled on February 01, 2023. Report to [facility] Main Entrance "A" at 10:00 A.M., then check in with the Admitting office. Any questions or running late day of surgery: call [phone]  Questions prior to your surgery date: call [phone], Monday-Friday, 8am-4pm. If you experience any cold or flu symptoms such as cough, fever, chills, shortness of breath, etc. between now and your scheduled surgery, please notify us at the above number.     Remember:  Do not eat after midnight the night before your surgery  You may drink clear liquids until 9:00 AM the morning of your surgery.   Clear liquids allowed are: Water, Non-Citrus Juices (without pulp), Carbonated Beverages, Clear Tea (no milk, honey, etc.), Black Coffee Only (NO MILK, CREAM OR POWDERED CREAMER of any kind), and Gatorade.  Patient Instructions  The night before surgery:  No food after midnight. ONLY clear liquids after midnight  The day of surgery (if you do NOT have diabetes):  Drink ONE (1) Pre-Surgery Clear Ensure by 9:00 AM the morning of surgery. Drink in one sitting. Do not sip.  This drink was given to you during your hospital  pre-op appointment visit.  Nothing else to drink after completing the  Pre-Surgery Clear Ensure.         If you have questions, please contact your surgeon's office.    Take these medicines the morning of surgery with A SIP OF WATER: oxybutynin (DITROPAN-XL)    One week prior to surgery, STOP taking any Aspirin (unless otherwise instructed by your surgeon) Aleve, Naproxen, Ibuprofen, Motrin, Advil, Goody's, BC's, all herbal medications, fish oil, and non-prescription vitamins.                     Do NOT Smoke (Tobacco/Vaping) for 24 hours prior to your procedure.  If you use a CPAP at night, you may bring your mask/headgear for your overnight stay.   You will be asked to remove any contacts, glasses, piercing's, hearing aid's,  dentures/partials prior to surgery. Please bring cases for these items if needed.    Patients discharged the day of surgery will not be allowed to drive home, and someone needs to stay with them for 24 hours.  SURGICAL WAITING ROOM VISITATION Patients may have no more than 2 support people in the waiting area - these visitors may rotate.   Pre-op nurse will coordinate an appropriate time for 1 ADULT support person, who may not rotate, to accompany patient in pre-op.  Children under the age of 71 must have an adult with them who is not the patient and must remain in the main waiting area with an adult.  If the patient needs to stay at the hospital during part of their recovery, the visitor guidelines for inpatient rooms apply.  Please refer to the [facility] website for the visitor guidelines for any additional information.   If you received a COVID test during your pre-op visit  it is requested that you wear a mask when out in public, stay away from anyone that may not be feeling well and notify your surgeon if you develop symptoms. If you have been in contact with anyone that has tested positive in the last 10 days please notify you surgeon.      Pre-operative 5 CHG Bathing Instructions   You can play a key role in reducing the risk of infection after surgery. Your skin needs to be as free of germs as  possible. You can reduce the number of germs on your skin by washing with CHG (chlorhexidine gluconate) soap before surgery. CHG is an antiseptic soap that kills germs and continues to kill germs even after washing.   DO NOT use if you have an allergy to chlorhexidine/CHG or antibacterial soaps. If your skin becomes reddened or irritated, stop using the CHG and notify one of our RNs at [phone].   Please shower with the CHG soap starting 4 days before surgery using the following schedule:     Please keep in mind the following:  DO NOT shave, including legs and underarms, starting the  day of your first shower.   You may shave your face at any point before/day of surgery.  Place clean sheets on your bed the day you start using CHG soap. Use a clean washcloth (not used since being washed) for each shower. DO NOT sleep with pets once you start using the CHG.   CHG Shower Instructions:  Wash your face and private area with normal soap. If you choose to wash your hair, wash first with your normal shampoo.  After you use shampoo/soap, rinse your hair and body thoroughly to remove shampoo/soap residue.  Turn the water OFF and apply about 3 tablespoons (45 ml) of CHG soap to a CLEAN washcloth.  Apply CHG soap ONLY FROM YOUR NECK DOWN TO YOUR TOES (washing for 3-5 minutes)  DO NOT use CHG soap on face, private areas, open wounds, or sores.  Pay special attention to the area where your surgery is being performed.  If you are having back surgery, having someone wash your back for you may be helpful. Wait 2 minutes after CHG soap is applied, then you may rinse off the CHG soap.  Pat dry with a clean towel  Put on clean clothes/pajamas   If you choose to wear lotion, please use ONLY the CHG-compatible lotions that are listed below.  Additional instructions for the day of surgery: DO NOT APPLY any lotions, deodorants, cologne, or perfumes.   Do not bring valuables to the hospital. [facility] is not responsible for any belongings/valuables. Do not wear nail polish, gel polish, artificial nails, or any other type of covering on natural nails (fingers and toes) Do not wear jewelry or makeup Put on clean/comfortable clothes.  Please brush your teeth.  Ask your nurse before applying any prescription medications to the skin.     CHG Compatible Lotions   Aveeno Moisturizing lotion  Cetaphil Moisturizing Cream  Cetaphil Moisturizing Lotion  Clairol Herbal Essence Moisturizing Lotion, Dry Skin  Clairol Herbal Essence Moisturizing Lotion, Extra Dry Skin  Clairol Herbal Essence  Moisturizing Lotion, Normal Skin  Curel Age Defying Therapeutic Moisturizing Lotion with Alpha Hydroxy  Curel Extreme Care Body Lotion  Curel Soothing Hands Moisturizing Hand Lotion  Curel Therapeutic Moisturizing Cream, Fragrance-Free  Curel Therapeutic Moisturizing Lotion, Fragrance-Free  Curel Therapeutic Moisturizing Lotion, Original Formula  Eucerin Daily Replenishing Lotion  Eucerin Dry Skin Therapy Plus Alpha Hydroxy Crme  Eucerin Dry Skin Therapy Plus Alpha Hydroxy Lotion  Eucerin Original Crme  Eucerin Original Lotion  Eucerin Plus Crme Eucerin Plus Lotion  Eucerin TriLipid Replenishing Lotion  Keri Anti-Bacterial Hand Lotion  Keri Deep Conditioning Original Lotion Dry Skin Formula Softly Scented  Keri Deep Conditioning Original Lotion, Fragrance Free Sensitive Skin Formula  Keri Lotion Fast Absorbing Fragrance Free Sensitive Skin Formula  Keri Lotion Fast Absorbing Softly Scented Dry Skin Formula  Keri Original Lotion  Keri Skin Renewal Lotion  Keri Silky Smooth Lotion  Keri Silky Smooth Sensitive Skin Lotion  Nivea Body Creamy Conditioning Oil  Nivea Body Extra Enriched Lotion  Nivea Body Original Lotion  Nivea Body Sheer Moisturizing Lotion Nivea Crme  Nivea Skin Firming Lotion  NutraDerm 30 Skin Lotion  NutraDerm Skin Lotion  NutraDerm Therapeutic Skin Cream  NutraDerm Therapeutic Skin Lotion  ProShield Protective Hand Cream  Provon moisturizing lotion   For additional information about your upcoming joint replacement surgery, please use the QR code or web address listed below.    [email or web address]

## 2023-01-27 ENCOUNTER — Encounter (HOSPITAL_COMMUNITY): Payer: Self-pay

## 2023-01-27 ENCOUNTER — Other Ambulatory Visit: Payer: Self-pay

## 2023-01-27 ENCOUNTER — Encounter (HOSPITAL_COMMUNITY)
Admission: RE | Admit: 2023-01-27 | Discharge: 2023-01-27 | Disposition: A | Discharge status: Home / Self Care | Payer: Medicare HMO | Source: Ambulatory Visit | Attending: Orthopaedic Surgery | Admitting: Orthopaedic Surgery

## 2023-01-27 VITALS — BP 131/74 | HR 88 | Temp 98.2°F | Resp 17 | Ht 71.0 in | Wt 279.0 lb

## 2023-01-27 DIAGNOSIS — Z01818 Encounter for other preprocedural examination: Secondary | ICD-10-CM | POA: Diagnosis not present

## 2023-01-27 DIAGNOSIS — R9431 Abnormal electrocardiogram [ECG] [EKG]: Secondary | ICD-10-CM | POA: Insufficient documentation

## 2023-01-27 DIAGNOSIS — I2510 Atherosclerotic heart disease of native coronary artery without angina pectoris: Secondary | ICD-10-CM | POA: Insufficient documentation

## 2023-01-27 DIAGNOSIS — Z01812 Encounter for preprocedural laboratory examination: Secondary | ICD-10-CM | POA: Diagnosis present

## 2023-01-27 DIAGNOSIS — M1712 Unilateral primary osteoarthritis, left knee: Secondary | ICD-10-CM | POA: Insufficient documentation

## 2023-01-27 DIAGNOSIS — Z01810 Encounter for preprocedural cardiovascular examination: Secondary | ICD-10-CM | POA: Diagnosis present

## 2023-01-27 HISTORY — DX: Other complications of anesthesia, initial encounter: T88.59XA

## 2023-01-27 LAB — CBC
HCT: 43.5 % (ref 39.0–52.0)
Hemoglobin: 14.3 g/dL (ref 13.0–17.0)
MCH: 31.4 pg (ref 26.0–34.0)
MCHC: 32.9 g/dL (ref 30.0–36.0)
MCV: 95.4 fL (ref 80.0–100.0)
Platelets: 216 10*3/uL (ref 150–400)
RBC: 4.56 MIL/uL (ref 4.22–5.81)
RDW: 13.6 % (ref 11.5–15.5)
WBC: 5.5 10*3/uL (ref 4.0–10.5)
nRBC: 0 % (ref 0.0–0.2)

## 2023-01-27 LAB — SURGICAL PCR SCREEN
MRSA, PCR: NEGATIVE
Staphylococcus aureus: POSITIVE — AB

## 2023-01-27 LAB — BASIC METABOLIC PANEL
Anion gap: 6 (ref 5–15)
BUN: 19 mg/dL (ref 8–23)
CO2: 28 mmol/L (ref 22–32)
Calcium: 8.7 mg/dL — ABNORMAL LOW (ref 8.9–10.3)
Chloride: 106 mmol/L (ref 98–111)
Creatinine, Ser: 0.96 mg/dL (ref 0.61–1.24)
GFR, Estimated: >60 mL/min (ref >60)
Glucose, Bld: 103 mg/dL — ABNORMAL HIGH (ref 70–99)
Potassium: 4.4 mmol/L (ref 3.5–5.1)
Sodium: 140 mmol/L (ref 135–145)

## 2023-01-27 NOTE — Progress Notes (Signed)
PCP - Dr. Lynnea Ferrier Cardiologist - Denies  PPM/ICD - Denies Device Orders - n/a Rep Notified - n/a  Chest x-ray - n/a EKG - 01/27/2023 Stress Test - Denies ECHO - 07/29/2021 Cardiac Cath - Denies  Sleep Study - Denies CPAP - n/a  No DM  Last dose of GLP1 agonist- n/a GLP1 instructions: n/a  Blood Thinner Instructions: n/a Aspirin Instructions: n/a  ERAS Protcol - Clear liquids until 0900 morning of surgery PRE-SURGERY Ensure or G2- Ensure given to pt with instructions  COVID TEST- n/a   Anesthesia review: Yes. EKG review   Patient denies shortness of breath, fever, cough and chest pain at PAT appointment. Pt denies any respiratory illness/infection in the last two months.   All instructions explained to the patient, with a verbal understanding of the material. Patient agrees to go over the instructions while at home for a better understanding. Patient also instructed to self quarantine after being tested for COVID-19. The opportunity to ask questions was provided.

## 2023-01-28 NOTE — Anesthesia Preprocedure Evaluation (Signed)
Anesthesia Evaluation  Patient identified by MRN, date of birth, ID band Patient awake    Reviewed: Allergy & Precautions, NPO status , Patient's Chart, lab work & pertinent test results  Airway Mallampati: I  TM Distance: >3 FB Neck ROM: Full    Dental  (+) Teeth Intact, Dental Advisory Given   Pulmonary neg pulmonary ROS   Pulmonary exam normal breath sounds clear to auscultation       Cardiovascular hypertension (140/75 preop), Normal cardiovascular exam Rhythm:Regular Rate:Normal  Echo 2023 1. Technically difficult study. Left ventricular ejection fraction, by  estimation, is 50 to 55%. The left ventricle has low normal function. Left  ventricular endocardial border not optimally defined to evaluate regional  wall motion. There is mild left  ventricular hypertrophy. Left ventricular diastolic parameters were  normal.   2. Right ventricular systolic function is normal. The right ventricular  size is normal. Tricuspid regurgitation signal is inadequate for assessing  PA pressure.   3. The mitral valve is normal in structure. No evidence of mitral valve  regurgitation.   4. The aortic valve was not well visualized. Aortic valve regurgitation  is not visualized. No aortic stenosis is present.   5. Aortic dilatation noted. There is dilatation of the ascending aorta,  measuring 41 mm.   6. The inferior vena cava is dilated in size with <50% respiratory  variability, suggesting right atrial pressure of 15 mmHg.     Neuro/Psych negative neurological ROS  negative psych ROS   GI/Hepatic Neg liver ROS,,,Gastric bypass 1982   Endo/Other  Obesity BMI 38  Renal/GU negative Renal ROS  negative genitourinary   Musculoskeletal  (+) Arthritis , Osteoarthritis,    Abdominal  (+) + obese  Peds  Hematology negative hematology ROS (+) Hb 14.3, plt 216   Anesthesia Other Findings   Reproductive/Obstetrics negative OB  ROS                             Anesthesia Physical Anesthesia Plan  ASA: 3  Anesthesia Plan: MAC, Regional and Spinal   Post-op Pain Management: Tylenol PO (pre-op)* and Regional block*   Induction:   PONV Risk Score and Plan: 2 and Propofol infusion and TIVA  Airway Management Planned: Natural Airway and Simple Face Mask  Additional Equipment: None  Intra-op Plan:   Post-operative Plan:   Informed Consent: I have reviewed the patients History and Physical, chart, labs and discussed the procedure including the risks, benefits and alternatives for the proposed anesthesia with the patient or authorized representative who has indicated his/her understanding and acceptance.       Plan Discussed with: CRNA  Anesthesia Plan Comments: (  )       Anesthesia Quick Evaluation

## 2023-01-28 NOTE — Progress Notes (Signed)
Anesthesia Chart Review:  Case: [id number] Date/Time: 02/01/23 1145   Procedure: LEFT TOTAL KNEE ARTHROPLASTY (Left: Knee)   Anesthesia type: Spinal   Pre-op diagnosis: osteoarthritis left knee   [facility]: [facility] 07 / [facility]   Surgeons: Kathryne Hitch, MD       DISCUSSION: Patient is a 78 year old male scheduled for the above procedure. He has been followed by Ortho since April 2024 for end-stage OA of bilateral knees. Pain was worse in the left knee. Left TKA recommended but required a BMI of < 40. Since then he has been able to lose > 30 lb with BMI last calculated at 38.91, with weight of 126.6 kg.   History includes never smoker, HTN, BPH, osteoarthritis, gastric bypass (1982), cholecystectomy (6440), appendectomy (1963), knee arthroscopy. Reported "waking up to quickly" with a prior procedure.  PCP is Donita Brooks, MD for at least the past 10 years. He had previous vascular surgery evaluation by Waverly Ferrari in 2023 for BLE edema, L > R. LLE ulceration healed with wound care. He had an unremarkable venous reflux study, and no significant arterial insufficiency by [place]. Swelling thought likely related to lymphedema and compression stockings recommended. Dr. Tanya Nones also ordered an echocardiogram which was done in July 2023 and showed LVEF 50-55%, LV endocardial border not optimally defined to evaluate regional wall motion, mild LVH, normal diastolic parameters, normal RV systolic function, no significant valvular disease, 41 mm ascending aorta. Dr. Tanya Nones reviewed the echo with the patient. He was not having any SOB or chest pain, orthopnea, or PND. He felt a majority of his issues stemmed from morbid obesity, so unless new symptoms he recommended aggressive lifestyle changes. Last evaluation 09/02/22 for routine follow-up. He was trying to lose weight so he could undergo left TKA. He denied chest pain and SOB.  Preoperative EKG and labs reviewed. No history of DM. A1c 5.2%  on 09/02/22.  He denied chest pain and SOB.  Anesthesia team to evaluate on the day of surgery.    VS: BP 131/74   Pulse 88   Temp 36.8 C   Resp 17   Ht 5\' 11"  (1.803 m)   Wt 126.6 kg   SpO2 96%   BMI 38.91 kg/m    PROVIDERS: Donita Brooks, MD is PCP    LABS: Labs reviewed: Acceptable for surgery. (all labs ordered are listed, but only abnormal results are displayed)  Labs Reviewed  SURGICAL PCR SCREEN - Abnormal; Notable for the following components:      Result Value   Staphylococcus aureus POSITIVE (*)    All other components within normal limits  BASIC METABOLIC PANEL - Abnormal; Notable for the following components:   Glucose, Bld 103 (*)    Calcium 8.7 (*)    All other components within normal limits  CBC     IMAGES: XR Knee 1-2 Views Left 04/22/2022: Left knee 2 views: No acute fracture.  Moderate patellofemoral arthritic changes.  Bone-on-bone medial compartment.  Moderate to severe arthritic changes lateral compartment with osteophytes present.   XR Knee 1-2 Views Right 04/22/2022: Right knee 3 views: Knee is well located.  Tricompartmental arthritis with near bone-on-bone medial compartment.  Lateral compartment with moderate to severe arthritic changes and osteophytes off the lateral compartment.  Severe patellofemoral arthritic changes.  No acute fractures.   MRI L-spine 08/19/18: IMPRESSION: 1. L4-5 severe spinal canal stenosis with moderate bilateral neural foraminal stenosis. 2. L3-4 severe bilateral neural foraminal stenosis. 3.  L2-3 moderate bilateral neural foraminal stenosis.   EKG: EKG 01/27/23: Normal sinus rhythm Left axis deviation Low voltage QRS Inferior-posterior infarct , age undetermined Abnormal ECG When compared with ECG of 30-Jul-2011 19:55, PREVIOUS ECG IS PRESENT decreased rate Confirmed by Charlton Haws [phone]) on 01/27/2023 5:54:09 PM - EKG fro m7/26/13 showed ST at 110 bpm, probable posterior infarct. LAD is new on  current tracing.    CV: Echo 07/29/21: IMPRESSIONS   1. Technically difficult study. Left ventricular ejection fraction, by  estimation, is 50 to 55%. The left ventricle has low normal function. Left  ventricular endocardial border not optimally defined to evaluate regional  wall motion. There is mild left  ventricular hypertrophy. Left ventricular diastolic parameters were  normal.   2. Right ventricular systolic function is normal. The right ventricular  size is normal. Tricuspid regurgitation signal is inadequate for assessing  PA pressure.   3. The mitral valve is normal in structure. No evidence of mitral valve  regurgitation.   4. The aortic valve was not well visualized. Aortic valve regurgitation  is not visualized. No aortic stenosis is present.   5. Aortic dilatation noted. There is dilatation of the ascending aorta,  measuring 41 mm.   6. The inferior vena cava is dilated in size with <50% respiratory  variability, suggesting right atrial pressure of 15 mmHg.    Past Medical History:  Diagnosis Date   Arthritis    Back pain    Cataract    Cellulitis 07/31/2011   Complication of anesthesia    Pt wakes up too quickly   Enlarged prostate    Foot abscess, left 08/07/2011   Hypertension    prior to gastric bypass- no meds taken   Lumbar spinal stenosis    Obesity 08/09/2011    Past Surgical History:  Procedure Laterality Date   APPENDECTOMY  1963   CHOLECYSTECTOMY  1984   GASTRIC FUNDOPLICATION  1982   at [facility]   I & D EXTREMITY  08/07/2011   Procedure: IRRIGATION AND DEBRIDEMENT EXTREMITY;  Surgeon: Toni Arthurs, MD;  Location: [facility];  Service: Orthopedics;  Laterality: Left;  irrigation and debridement left foot abscess   KNEE ARTHROSCOPY Right     MEDICATIONS:  oxybutynin (DITROPAN-XL) 10 MG 24 hr tablet   Pediatric Multivit-Minerals (FLINTSTONES COMPLETE) CHEW   tamsulosin (FLOMAX) 0.4 MG CAPS capsule   No current facility-administered  medications for this encounter.    Shonna Chock, PA-C Surgical Short Stay/Anesthesiology [facility] Phone [phone] [facility] Phone [phone] 01/28/2023 4:07 PM

## 2023-01-31 ENCOUNTER — Telehealth: Payer: Self-pay | Admitting: *Deleted

## 2023-01-31 NOTE — H&P (Signed)
TOTAL KNEE ADMISSION H&P  Patient is being admitted for left total knee arthroplasty.  Subjective:  Chief Complaint:left knee pain.  HPI: Keith Watkins, 78 y.o. male, has a history of pain and functional disability in the left knee due to arthritis and has failed non-surgical conservative treatments for greater than 12 weeks to includeNSAID's and/or analgesics, corticosteriod injections, viscosupplementation injections, flexibility and strengthening excercises, use of assistive devices, weight reduction as appropriate, and activity modification.  Onset of symptoms was gradual, starting 3 years ago with gradually worsening course since that time. The patient noted no past surgery on the left knee(s).  Patient currently rates pain in the left knee(s) at 10 out of 10 with activity. Patient has night pain, worsening of pain with activity and weight bearing, pain that interferes with activities of daily living, pain with passive range of motion, crepitus, and joint swelling.  Patient has evidence of subchondral sclerosis, periarticular osteophytes, and joint space narrowing by imaging studies. There is no active infection.  Patient Active Problem List   Diagnosis Date Noted   Primary osteoarthritis of left knee 12/15/2022   Morbid obesity with BMI of 45.0-49.9, adult (HCC) 06/21/2018   Bilateral lower extremity edema 06/21/2018   Cellulitis of left lower leg 08/21/2016   Eosinophilia 03/18/2014   Lumbar spinal stenosis    Benign prostatic hyperplasia (BPH) with urinary urge incontinence 08/09/2011   Past Medical History:  Diagnosis Date   Arthritis    Back pain    Cataract    Cellulitis 07/31/2011   Complication of anesthesia    Pt wakes up too quickly   Enlarged prostate    Foot abscess, left 08/07/2011   Hypertension    prior to gastric bypass- no meds taken   Lumbar spinal stenosis    Obesity 08/09/2011    Past Surgical History:  Procedure Laterality Date   APPENDECTOMY  1963    CHOLECYSTECTOMY  1984   GASTRIC FUNDOPLICATION  1982   at [facility]   I & D EXTREMITY  08/07/2011   Procedure: IRRIGATION AND DEBRIDEMENT EXTREMITY;  Surgeon: Toni Arthurs, MD;  Location: [facility];  Service: Orthopedics;  Laterality: Left;  irrigation and debridement left foot abscess   KNEE ARTHROSCOPY Right     No current facility-administered medications for this encounter.   Current Outpatient Medications  Medication Sig Dispense Refill Last Dose/Taking   oxybutynin (DITROPAN-XL) 10 MG 24 hr tablet Take 1 tablet (10 mg total) by mouth in the morning and at bedtime. 60 tablet 3 Taking   Pediatric Multivit-Minerals (FLINTSTONES COMPLETE) CHEW Chew 1 tablet by mouth daily.   Taking   tamsulosin (FLOMAX) 0.4 MG CAPS capsule Take 1 capsule (0.4 mg total) by mouth daily. (Patient taking differently: Take 0.4 mg by mouth at bedtime.) 90 capsule 0 Taking Differently   No Known Allergies  Social History   Tobacco Use   Smoking status: Never   Smokeless tobacco: Never  Substance Use Topics   Alcohol use: No    Family History  Problem Relation Age of Onset   Heart disease Mother    Cancer Sister        Breast   Breast cancer Sister    Hypertension Brother    Hypertension Maternal Grandmother    Stroke Maternal Grandmother    Heart disease Maternal Grandfather    Hypertension Maternal Grandfather    Diabetes Sister    Heart disease Sister    Hyperlipidemia Sister    Hypertension Sister  Stroke Sister    Diabetes Other    Stroke Other    Hypertension Other    Coronary artery disease Other    Heart failure Other    Colon cancer Neg Hx    Esophageal cancer Neg Hx    Stomach cancer Neg Hx    Rectal cancer Neg Hx      Review of Systems  Objective:  Physical Exam Vitals reviewed.  Constitutional:      Appearance: Normal appearance. He is obese.  HENT:     Head: Normocephalic and atraumatic.  Eyes:     Extraocular Movements: Extraocular movements intact.      Pupils: Pupils are equal, round, and reactive to light.  Cardiovascular:     Rate and Rhythm: Normal rate and regular rhythm.     Pulses: Normal pulses.  Pulmonary:     Effort: Pulmonary effort is normal.     Breath sounds: Normal breath sounds.  Abdominal:     Palpations: Abdomen is soft.  Musculoskeletal:     Cervical back: Normal range of motion and neck supple.     Left knee: Effusion, bony tenderness and crepitus present. Decreased range of motion. Tenderness present over the medial joint line and lateral joint line. Abnormal alignment.  Neurological:     Mental Status: He is alert and oriented to person, place, and time.  Psychiatric:        Behavior: Behavior normal.     Vital signs in last 24 hours:    Labs:   Estimated body mass index is 38.91 kg/m as calculated from the following:   Height as of 01/27/23: 5\' 11"  (1.803 m).   Weight as of 01/27/23: 126.6 kg.   Imaging Review Plain radiographs demonstrate severe degenerative joint disease of the left knee(s). The overall alignment ismild varus. The bone quality appears to be good for age and reported activity level.      Assessment/Plan:  End stage arthritis, left knee   The patient history, physical examination, clinical judgment of the provider and imaging studies are consistent with end stage degenerative joint disease of the left knee(s) and total knee arthroplasty is deemed medically necessary. The treatment options including medical management, injection therapy arthroscopy and arthroplasty were discussed at length. The risks and benefits of total knee arthroplasty were presented and reviewed. The risks due to aseptic loosening, infection, stiffness, patella tracking problems, thromboembolic complications and other imponderables were discussed. The patient acknowledged the explanation, agreed to proceed with the plan and consent was signed. Patient is being admitted for inpatient treatment for surgery, pain  control, PT, OT, prophylactic antibiotics, VTE prophylaxis, progressive ambulation and ADL's and discharge planning. The patient is planning to be discharged home with home health services

## 2023-01-31 NOTE — Care Plan (Signed)
[facility] RNCM call to patient to discuss his upcoming Left total knee arthroplasty with Dr. Magnus Ivan on 02/01/23 at [facility]. He is agreeable to case management. He lives with his spouse, who will be assisting him after discharge home. He has a rollator, rolling standard walker, commode chair, shower chair, and cane. No DME needed. Anticipate HHPT will be needed after a short hospital stay. Referral made to [facility] after choice provided. Reviewed post op care instructions and questions answered. Will continue to follow for needs.

## 2023-01-31 NOTE — Telephone Encounter (Signed)
Ortho bundle pre-op call completed.

## 2023-02-01 ENCOUNTER — Observation Stay (HOSPITAL_COMMUNITY)
Admission: RE | Admit: 2023-02-01 | Discharge: 2023-02-04 | Disposition: A | Discharge status: Home / Self Care | Payer: Medicare HMO | Attending: Orthopaedic Surgery | Admitting: Orthopaedic Surgery

## 2023-02-01 ENCOUNTER — Other Ambulatory Visit: Payer: Self-pay

## 2023-02-01 ENCOUNTER — Encounter (HOSPITAL_COMMUNITY): Payer: Self-pay | Admitting: Orthopaedic Surgery

## 2023-02-01 ENCOUNTER — Ambulatory Visit (HOSPITAL_COMMUNITY): Payer: Medicare HMO | Admitting: Certified Registered"

## 2023-02-01 ENCOUNTER — Observation Stay (HOSPITAL_COMMUNITY): Payer: Medicare HMO

## 2023-02-01 ENCOUNTER — Encounter (HOSPITAL_COMMUNITY)
Admission: RE | Disposition: A | Discharge status: Home / Self Care | Payer: Self-pay | Source: Home / Self Care | Attending: Orthopaedic Surgery

## 2023-02-01 ENCOUNTER — Ambulatory Visit (HOSPITAL_COMMUNITY): Payer: Self-pay | Admitting: Vascular Surgery

## 2023-02-01 DIAGNOSIS — M1712 Unilateral primary osteoarthritis, left knee: Secondary | ICD-10-CM | POA: Diagnosis not present

## 2023-02-01 DIAGNOSIS — R609 Edema, unspecified: Secondary | ICD-10-CM | POA: Diagnosis not present

## 2023-02-01 DIAGNOSIS — Z96652 Presence of left artificial knee joint: Secondary | ICD-10-CM

## 2023-02-01 DIAGNOSIS — I10 Essential (primary) hypertension: Secondary | ICD-10-CM

## 2023-02-01 DIAGNOSIS — Z6841 Body Mass Index (BMI) 40.0 and over, adult: Secondary | ICD-10-CM | POA: Diagnosis not present

## 2023-02-01 DIAGNOSIS — Z471 Aftercare following joint replacement surgery: Secondary | ICD-10-CM | POA: Diagnosis not present

## 2023-02-01 DIAGNOSIS — G8918 Other acute postprocedural pain: Secondary | ICD-10-CM | POA: Diagnosis not present

## 2023-02-01 DIAGNOSIS — Z79899 Other long term (current) drug therapy: Secondary | ICD-10-CM | POA: Insufficient documentation

## 2023-02-01 HISTORY — PX: TOTAL KNEE ARTHROPLASTY: SHX125

## 2023-02-01 SURGERY — ARTHROPLASTY, KNEE, TOTAL
Anesthesia: Monitor Anesthesia Care | Site: Knee | Laterality: Left

## 2023-02-01 MED ORDER — PHENYLEPHRINE HCL-NACL 20-0.9 MG/250ML-% IV SOLN
INTRAVENOUS | Status: DC | PRN
  Administered 2023-02-01: 40 ug/min via INTRAVENOUS

## 2023-02-01 MED ORDER — CHLORHEXIDINE GLUCONATE 0.12 % MT SOLN
15.0000 mL | Freq: Once | OROMUCOSAL | Status: AC
  Administered 2023-02-01: 15 mL via OROMUCOSAL
  Filled 2023-02-01: qty 15

## 2023-02-01 MED ORDER — AMISULPRIDE (ANTIEMETIC) 5 MG/2ML IV SOLN
10.0000 mg | Freq: Once | INTRAVENOUS | Status: DC | PRN
Start: 2023-02-01 — End: 2023-02-01

## 2023-02-01 MED ORDER — ALUM & MAG HYDROXIDE-SIMETH 200-200-20 MG/5ML PO SUSP: 30.0000 mL | ORAL | Status: DC | PRN

## 2023-02-01 MED ORDER — METHOCARBAMOL 1000 MG/10ML IJ SOLN: 500.0000 mg | Freq: Four times a day (QID) | INTRAMUSCULAR | Status: DC | PRN

## 2023-02-01 MED ORDER — ASPIRIN 81 MG PO CHEW
81.0000 mg | CHEWABLE_TABLET | Freq: Two times a day (BID) | ORAL | Status: DC
Start: 2023-02-01 — End: 2023-02-04
  Administered 2023-02-01 – 2023-02-04 (×6): 81 mg via ORAL
  Filled 2023-02-01 (×6): qty 1

## 2023-02-01 MED ORDER — FENTANYL CITRATE (PF) 250 MCG/5ML IJ SOLN
INTRAMUSCULAR | Status: DC | PRN
  Administered 2023-02-01: 50 ug via INTRAVENOUS

## 2023-02-01 MED ORDER — MENTHOL 3 MG MT LOZG: 1.0000 | LOZENGE | OROMUCOSAL | Status: DC | PRN

## 2023-02-01 MED ORDER — CEFAZOLIN SODIUM-DEXTROSE 3-4 GM/150ML-% IV SOLN
3.0000 g | INTRAVENOUS | Status: AC
  Administered 2023-02-01: 3 g via INTRAVENOUS
  Filled 2023-02-01: qty 150

## 2023-02-01 MED ORDER — ROPIVACAINE HCL 5 MG/ML IJ SOLN
INTRAMUSCULAR | Status: DC | PRN
  Administered 2023-02-01: 30 mL via PERINEURAL

## 2023-02-01 MED ORDER — OXYCODONE HCL 5 MG PO TABS
10.0000 mg | ORAL_TABLET | ORAL | Status: DC | PRN
  Administered 2023-02-02 (×3): 10 mg via ORAL
  Filled 2023-02-01: qty 2

## 2023-02-01 MED ORDER — ACETAMINOPHEN 500 MG PO TABS
1000.0000 mg | ORAL_TABLET | Freq: Once | ORAL | Status: AC
  Administered 2023-02-01: 1000 mg via ORAL
  Filled 2023-02-01: qty 2

## 2023-02-01 MED ORDER — DIPHENHYDRAMINE HCL 12.5 MG/5ML PO ELIX: 12.5000 mg | ORAL_SOLUTION | ORAL | Status: DC | PRN

## 2023-02-01 MED ORDER — ONDANSETRON HCL 4 MG/2ML IJ SOLN: 4.0000 mg | Freq: Four times a day (QID) | INTRAMUSCULAR | Status: DC | PRN

## 2023-02-01 MED ORDER — MIDAZOLAM HCL 2 MG/2ML IJ SOLN
INTRAMUSCULAR | Status: AC
  Filled 2023-02-01: qty 2

## 2023-02-01 MED ORDER — POVIDONE-IODINE 10 % EX SWAB
2.0000 | Freq: Once | CUTANEOUS | Status: AC
  Administered 2023-02-01: 2 via TOPICAL

## 2023-02-01 MED ORDER — SODIUM CHLORIDE 0.9 % IV SOLN
INTRAVENOUS | Status: AC
Start: 2023-02-01 — End: 2023-02-02

## 2023-02-01 MED ORDER — ACETAMINOPHEN 325 MG PO TABS: 325.0000 mg | ORAL_TABLET | Freq: Four times a day (QID) | ORAL | Status: DC | PRN

## 2023-02-01 MED ORDER — ONDANSETRON HCL 4 MG/2ML IJ SOLN
INTRAMUSCULAR | Status: DC | PRN
  Administered 2023-02-01: 4 mg via INTRAVENOUS

## 2023-02-01 MED ORDER — FENTANYL CITRATE (PF) 250 MCG/5ML IJ SOLN
INTRAMUSCULAR | Status: AC
  Filled 2023-02-01: qty 5

## 2023-02-01 MED ORDER — PROPOFOL 500 MG/50ML IV EMUL
INTRAVENOUS | Status: DC | PRN
  Administered 2023-02-01: 50 ug/kg/min via INTRAVENOUS

## 2023-02-01 MED ORDER — METOCLOPRAMIDE HCL 5 MG PO TABS: 5.0000 mg | ORAL_TABLET | Freq: Three times a day (TID) | ORAL | Status: DC | PRN

## 2023-02-01 MED ORDER — BUPIVACAINE IN DEXTROSE 0.75-8.25 % IT SOLN
INTRATHECAL | Status: DC | PRN
  Administered 2023-02-01: 2 mL via INTRATHECAL

## 2023-02-01 MED ORDER — ORAL CARE MOUTH RINSE: 15.0000 mL | Freq: Once | OROMUCOSAL | Status: AC

## 2023-02-01 MED ORDER — FENTANYL CITRATE (PF) 100 MCG/2ML IJ SOLN: 100.0000 ug | Freq: Once | INTRAMUSCULAR | Status: AC

## 2023-02-01 MED ORDER — TRANEXAMIC ACID-NACL 1000-0.7 MG/100ML-% IV SOLN
1000.0000 mg | INTRAVENOUS | Status: AC
  Administered 2023-02-01: 1000 mg via INTRAVENOUS
  Filled 2023-02-01: qty 100

## 2023-02-01 MED ORDER — DEXAMETHASONE SODIUM PHOSPHATE 10 MG/ML IJ SOLN
INTRAMUSCULAR | Status: DC | PRN
  Administered 2023-02-01: 10 mg

## 2023-02-01 MED ORDER — LACTATED RINGERS IV SOLN: INTRAVENOUS | Status: DC

## 2023-02-01 MED ORDER — ONDANSETRON HCL 4 MG/2ML IJ SOLN: 4.0000 mg | Freq: Once | INTRAMUSCULAR | Status: DC | PRN

## 2023-02-01 MED ORDER — 0.9 % SODIUM CHLORIDE (POUR BTL) OPTIME
TOPICAL | Status: DC | PRN
  Administered 2023-02-01: 1000 mL

## 2023-02-01 MED ORDER — PANTOPRAZOLE SODIUM 40 MG PO TBEC
40.0000 mg | DELAYED_RELEASE_TABLET | Freq: Every day | ORAL | Status: DC
  Administered 2023-02-01 – 2023-02-03 (×3): 40 mg via ORAL
  Filled 2023-02-01 (×4): qty 1

## 2023-02-01 MED ORDER — METOCLOPRAMIDE HCL 5 MG/ML IJ SOLN: 5.0000 mg | Freq: Three times a day (TID) | INTRAMUSCULAR | Status: DC | PRN

## 2023-02-01 MED ORDER — BUPIVACAINE-EPINEPHRINE 0.25% -1:200000 IJ SOLN
INTRAMUSCULAR | Status: DC | PRN
  Administered 2023-02-01: 30 mL

## 2023-02-01 MED ORDER — METHOCARBAMOL 500 MG PO TABS
500.0000 mg | ORAL_TABLET | Freq: Four times a day (QID) | ORAL | Status: DC | PRN
  Administered 2023-02-01 – 2023-02-02 (×4): 500 mg via ORAL
  Filled 2023-02-01 (×4): qty 1

## 2023-02-01 MED ORDER — MUPIROCIN 2 % EX OINT: 1.0000 | TOPICAL_OINTMENT | Freq: Two times a day (BID) | CUTANEOUS | 0 refills | Status: AC

## 2023-02-01 MED ORDER — OXYBUTYNIN CHLORIDE ER 10 MG PO TB24
10.0000 mg | ORAL_TABLET | Freq: Two times a day (BID) | ORAL | Status: DC
  Administered 2023-02-01 – 2023-02-04 (×6): 10 mg via ORAL
  Filled 2023-02-01 (×8): qty 1

## 2023-02-01 MED ORDER — PHENOL 1.4 % MT LIQD: 1.0000 | OROMUCOSAL | Status: DC | PRN

## 2023-02-01 MED ORDER — OXYCODONE HCL 5 MG PO TABS: 5.0000 mg | ORAL_TABLET | Freq: Once | ORAL | Status: DC | PRN

## 2023-02-01 MED ORDER — BUPIVACAINE-EPINEPHRINE (PF) 0.25% -1:200000 IJ SOLN
INTRAMUSCULAR | Status: AC
  Filled 2023-02-01: qty 30

## 2023-02-01 MED ORDER — TAMSULOSIN HCL 0.4 MG PO CAPS
0.4000 mg | ORAL_CAPSULE | Freq: Every day | ORAL | Status: DC
  Administered 2023-02-01 – 2023-02-03 (×3): 0.4 mg via ORAL
  Filled 2023-02-01 (×3): qty 1

## 2023-02-01 MED ORDER — CHLORHEXIDINE GLUCONATE 4 % EX SOLN: 1.0000 | CUTANEOUS | 1 refills | Status: DC

## 2023-02-01 MED ORDER — DOCUSATE SODIUM 100 MG PO CAPS
100.0000 mg | ORAL_CAPSULE | Freq: Two times a day (BID) | ORAL | Status: DC
  Administered 2023-02-01 – 2023-02-04 (×6): 100 mg via ORAL
  Filled 2023-02-01 (×6): qty 1

## 2023-02-01 MED ORDER — HYDROMORPHONE HCL 1 MG/ML IJ SOLN
0.5000 mg | INTRAMUSCULAR | Status: DC | PRN
  Administered 2023-02-01 – 2023-02-02 (×2): 1 mg via INTRAVENOUS
  Filled 2023-02-01 (×2): qty 1

## 2023-02-01 MED ORDER — SODIUM CHLORIDE 0.9 % IR SOLN
Status: DC | PRN
  Administered 2023-02-01: 1000 mL

## 2023-02-01 MED ORDER — CEFAZOLIN SODIUM-DEXTROSE 2-4 GM/100ML-% IV SOLN
2.0000 g | Freq: Four times a day (QID) | INTRAVENOUS | Status: AC
  Administered 2023-02-01 – 2023-02-02 (×2): 2 g via INTRAVENOUS
  Filled 2023-02-01 (×2): qty 100

## 2023-02-01 MED ORDER — FENTANYL CITRATE (PF) 100 MCG/2ML IJ SOLN
INTRAMUSCULAR | Status: AC
  Administered 2023-02-01: 100 ug via INTRAVENOUS
  Filled 2023-02-01: qty 2

## 2023-02-01 MED ORDER — ONDANSETRON HCL 4 MG PO TABS
4.0000 mg | ORAL_TABLET | Freq: Four times a day (QID) | ORAL | Status: DC | PRN
Start: 2023-02-01 — End: 2023-02-04

## 2023-02-01 MED ORDER — HYDROMORPHONE HCL 1 MG/ML IJ SOLN: 0.2500 mg | INTRAMUSCULAR | Status: DC | PRN

## 2023-02-01 MED ORDER — OXYCODONE HCL 5 MG PO TABS
5.0000 mg | ORAL_TABLET | ORAL | Status: DC | PRN
  Administered 2023-02-01 – 2023-02-02 (×3): 10 mg via ORAL
  Filled 2023-02-01 (×5): qty 2

## 2023-02-01 MED ORDER — OXYCODONE HCL 5 MG/5ML PO SOLN: 5.0000 mg | Freq: Once | ORAL | Status: DC | PRN

## 2023-02-01 SURGICAL SUPPLY — 64 items
BAG COUNTER SPONGE SURGICOUNT (BAG) ×1 IMPLANT
BANDAGE ESMARK 6X9 LF (GAUZE/BANDAGES/DRESSINGS) ×1 IMPLANT
BLADE SAG 18X100X1.27 (BLADE) ×1 IMPLANT
BLADE SAW SGTL 11.0X1.19X90.0M (BLADE) IMPLANT
BNDG ELASTIC 6INX 5YD STR LF (GAUZE/BANDAGES/DRESSINGS) IMPLANT
BNDG ELASTIC 6X5.8 VLCR STR LF (GAUZE/BANDAGES/DRESSINGS) ×2 IMPLANT
BNDG ESMARK 6X9 LF (GAUZE/BANDAGES/DRESSINGS) ×1
BOWL SMART MIX CTS (DISPOSABLE) IMPLANT
CEMENT BONE R 1X40 (Cement) IMPLANT
COOLER ICEMAN CLASSIC (MISCELLANEOUS) ×1 IMPLANT
COVER SURGICAL LIGHT HANDLE (MISCELLANEOUS) ×1 IMPLANT
CUFF TOURN SGL QUICK 42 (TOURNIQUET CUFF) IMPLANT
CUFF TRNQT CYL 34X4.125X (TOURNIQUET CUFF) ×1 IMPLANT
DRAPE EXTREMITY T 121X128X90 (DISPOSABLE) ×1 IMPLANT
DRAPE HALF SHEET 40X57 (DRAPES) ×1 IMPLANT
DRAPE U-SHAPE 47X51 STRL (DRAPES) ×1 IMPLANT
DURAPREP 26ML APPLICATOR (WOUND CARE) ×1 IMPLANT
ELECT CAUTERY BLADE 6.4 (BLADE) ×1 IMPLANT
ELECT REM PT RETURN 9FT ADLT (ELECTROSURGICAL) ×1
ELECTRODE REM PT RTRN 9FT ADLT (ELECTROSURGICAL) ×1 IMPLANT
FACESHIELD WRAPAROUND (MASK) ×2
FACESHIELD WRAPAROUND OR TEAM (MASK) ×2 IMPLANT
FEMUR CMT CCR STD SZ10 L KNEE (Knees) ×1 IMPLANT
FEMUR CMTD CR PERS STD SZ 5 RT (Knees) IMPLANT
GAUZE PAD ABD 8X10 STRL (GAUZE/BANDAGES/DRESSINGS) ×1 IMPLANT
GAUZE SPONGE 4X4 12PLY STRL (GAUZE/BANDAGES/DRESSINGS) ×1 IMPLANT
GAUZE SPONGE 4X4 12PLY STRL LF (GAUZE/BANDAGES/DRESSINGS) IMPLANT
GAUZE XEROFORM 1X8 LF (GAUZE/BANDAGES/DRESSINGS) ×1 IMPLANT
GLOVE BIOGEL PI IND STRL 8 (GLOVE) ×2 IMPLANT
GLOVE ORTHO TXT STRL SZ7.5 (GLOVE) ×1 IMPLANT
GLOVE SURG ORTHO 8.0 STRL STRW (GLOVE) ×1 IMPLANT
GOWN STRL REUS W/ TWL LRG LVL3 (GOWN DISPOSABLE) IMPLANT
GOWN STRL REUS W/ TWL XL LVL3 (GOWN DISPOSABLE) ×2 IMPLANT
IMMOBILIZER KNEE 22 UNIV (SOFTGOODS) ×1 IMPLANT
INSERT TIB AUG PS GH 10-12 LT (Insert) IMPLANT
IV NS 1000ML BAXH (IV SOLUTION) ×1 IMPLANT
KIT BASIN OR (CUSTOM PROCEDURE TRAY) ×1 IMPLANT
KIT TURNOVER KIT B (KITS) ×1 IMPLANT
MANIFOLD NEPTUNE II (INSTRUMENTS) ×1 IMPLANT
NDL 18GX1X1/2 (RX/OR ONLY) (NEEDLE) IMPLANT
NEEDLE 18GX1X1/2 (RX/OR ONLY) (NEEDLE)
NS IRRIG 1000ML POUR BTL (IV SOLUTION) ×1 IMPLANT
PACK TOTAL JOINT (CUSTOM PROCEDURE TRAY) ×1 IMPLANT
PAD ARMBOARD 7.5X6 YLW CONV (MISCELLANEOUS) ×1 IMPLANT
PAD CAST 4YDX4 CTTN HI CHSV (CAST SUPPLIES) IMPLANT
PAD COLD SHLDR WRAP-ON (PAD) ×1 IMPLANT
PADDING CAST COTTON 6X4 STRL (CAST SUPPLIES) ×1 IMPLANT
PIN DRILL HDLS TROCAR 75 4PK (PIN) IMPLANT
SCREW FEMALE HEX FIX 25X2.5 (ORTHOPEDIC DISPOSABLE SUPPLIES) IMPLANT
SET HNDPC FAN SPRY TIP SCT (DISPOSABLE) ×1 IMPLANT
SET PAD KNEE POSITIONER (MISCELLANEOUS) ×1 IMPLANT
STAPLER VISISTAT 35W (STAPLE) ×1 IMPLANT
STEM POLY PAT PLY 35M KNEE (Knees) IMPLANT
STEM TIB ST PERS 14+30 (Stem) IMPLANT
STEM TIBIA 5 DEG SZ G L KNEE (Knees) IMPLANT
SUCTION TUBE FRAZIER 10FR DISP (SUCTIONS) ×1 IMPLANT
SUT VIC AB 0 CT1 27XBRD ANBCTR (SUTURE) ×1 IMPLANT
SUT VIC AB 1 CT1 27XBRD ANBCTR (SUTURE) ×2 IMPLANT
SUT VIC AB 2-0 CT1 TAPERPNT 27 (SUTURE) ×2 IMPLANT
SYR 50ML LL SCALE MARK (SYRINGE) IMPLANT
TIBIA STEM 5 DEG SZ G L KNEE (Knees) ×1 IMPLANT
TOWEL GREEN STERILE (TOWEL DISPOSABLE) ×1 IMPLANT
TOWEL GREEN STERILE FF (TOWEL DISPOSABLE) ×1 IMPLANT
TRAY CATH INTERMITTENT SS 16FR (CATHETERS) IMPLANT

## 2023-02-01 NOTE — Interval H&P Note (Signed)
History and Physical Interval Note: The patient understands that he is here today for a left total knee replacement to treat his severe left knee arthritis and left knee pain.  There has been no acute or interval change in his medical status.  The risks and benefits of surgery been discussed in detail and informed consent has been obtained.  The left operative knee has been marked.  02/01/2023 10:50 AM  Keith Watkins  has presented today for surgery, with the diagnosis of osteoarthritis left knee.  The various methods of treatment have been discussed with the patient and family. After consideration of risks, benefits and other options for treatment, the patient has consented to  Procedure(s): LEFT TOTAL KNEE ARTHROPLASTY (Left) as a surgical intervention.  The patient's history has been reviewed, patient examined, no change in status, stable for surgery.  I have reviewed the patient's chart and labs.  Questions were answered to the patient's satisfaction.     Kathryne Hitch

## 2023-02-01 NOTE — Transfer of Care (Signed)
Immediate Anesthesia Transfer of Care Note  Patient: Keith Watkins  Procedure(s) Performed: LEFT TOTAL KNEE ARTHROPLASTY (Left: Knee)  Patient Location: PACU  Anesthesia Type:Spinal  Level of Consciousness: awake, alert , and oriented  Airway & Oxygen Therapy: Patient Spontanous Breathing and Patient connected to face mask oxygen  Post-op Assessment: Report given to RN and Post -op Vital signs reviewed and stable  Post vital signs: Reviewed and stable  Last Vitals:  Vitals Value Taken Time  BP 97/56 02/01/23 1438  Temp    Pulse 70 02/01/23 1440  Resp 17 02/01/23 1440  SpO2 94 % 02/01/23 1440  Vitals shown include unfiled device data.  Last Pain:  Vitals:   02/01/23 1110  TempSrc:   PainSc: 0-No pain         Complications: No notable events documented.

## 2023-02-01 NOTE — Op Note (Signed)
Operative Note  Date of operation: 02/01/2023 Preoperative diagnosis: Primary osteoarthritis left knee Postoperative diagnosis: Same  Procedure: Left cemented total knee replacement  Implants: Biomet/Zimmer persona cemented PS knee system Implant Name Type Inv. Item Serial No. Manufacturer Lot No. LRB No. Used Action  CEMENT BONE R 1X40 - [id number] Cement CEMENT BONE R 1X40  ZIMMER RECON(ORTH,TRAU,BIO,SG) [id number] Left 1 Implanted  CEMENT BONE R 1X40 - [id number] Cement CEMENT BONE R 1X40  ZIMMER RECON(ORTH,TRAU,BIO,SG) [id number] Left 1 Implanted  TIBIA STEM 5 DEG SZ G L KNEE - [id number] Knees TIBIA STEM 5 DEG SZ G L KNEE  ZIMMER RECON(ORTH,TRAU,BIO,SG) [id number] Left 1 Implanted  VivacitE Highly Crosslinked Polyethylene Left 14mm Posterior Stabilized    ZIMMER [id number] Left 1 Implanted  FEMUR CMT CCR STD SZ10 L KNEE - [id number] Knees FEMUR CMT CCR STD SZ10 L KNEE  ZIMMER RECON(ORTH,TRAU,BIO,SG) [id number] Left 1 Implanted  STEM TIB ST PERS 14+30 - [id number] Stem STEM TIB ST PERS 14+30  ZIMMER RECON(ORTH,TRAU,BIO,SG) [id number] Left 1 Implanted  STEM POLY PAT PLY 40M KNEE - [id number] Knees STEM POLY PAT PLY 40M KNEE  ZIMMER RECON(ORTH,TRAU,BIO,SG) [id number] Left 1 Implanted   Surgeon: Vanita Panda. Magnus Ivan, MD Assistant: Rexene Edison, PA-C  Anesthesia: #1 left lower extremity adductor canal block, #2 spinal, #3 local Tourniquet time: 1 hour EBL: Less than 100 cc Antibiotics: IV Ancef Complications: None  Indications: The patient is a 78 year old gentleman well-known to [place].  We have been following him for many years now as a relates to severe end-stage arthritis of both his knees.  His left knee hurts much worse than his right but both knees have severe end-stage arthritis.  He is also morbidly obese and has been on a weight loss journey.  His most recent BMI is down to 38.  We have recommended total knee replacement surgery after the failure conservative treatment for many years now.   He understands this will be quite difficult surgery due to the soft tissue envelope and deformity of his knee and there shortly heightened risk of acute blood loss anemia, nerve vessel injury, fracture, infection, DVT, implant failure and wound healing issues.  He understands that our goals are hopefully decreased pain, improved mobility, and improved quality of life.  Procedure description: After informed consent was obtained and the appropriate left knee was marked, a left lower extremity adductor canal block was obtained by anesthesia monitoring.  The patient was then brought to the operating room and set up on the operative table where spinal anesthesia was obtained.  He was then laid in supine position on the operating table and a Foley catheter was placed.  A nonsterile tourniquet was placed around his upper left thigh and his left thigh, knee, leg and ankle were prepped and draped with DuraPrep and sterile drapes.  A timeout was called and he was identified as the correct patient and the correct left knee.  An Esmarch was used to wrap of the leg and the tourniquet was applied and 3 mm of pressure.  With the knee extended a direct midline incision was made over the patella and carried proximally distally.  Dissection was carried down to the knee joint and a medial parapatellar arthrotomy was made finding a large joint effusion.  With the knee in a flexed position we found significant deformity in all 3 compartments of the knee with severe wearing of the knee.  His knee does hyperextend slightly subluxate.  We removed osteophytes from all 3 compartments as well as remnants of  the ACL PCL and medial lateral meniscus.  We then used an extramedullary cutting guide for making our proximal tibia cut correction of varus and valgus and a 3 degree slope.  We made this cut to take 2 mm off the low side and made the cut without difficulty.  We then used a intramedullary reference guide for right distal femur cut  setting this for a left knee at 5 degrees externally rotated and a 10 mm distal femoral cut.  We brought the knee back down to full extension and with a 10 mm extension block he slightly hyperextended.  This was what his preoperative extension was as well.  We then went back to the femur and put a femoral sizing guide based off the epicondylar axis.  Based off of this we chose a size 10 femur.  We put a 4-in-1 cutting block for a size 10 femur and made our anterior and posterior cuts followed our chamfer cuts.  We then backed the tibia and chose a size G left tibial tray for coverage of the tibial plateau setting the rotation of the tibial tubercle of the femur.  We did our drill hole and keel punch off of this.  We then trialed our size G left tibia combined with our size 10 left PS femur.  We did drill like holes for the femur into the femoral box cut off of the trial.  We then trialed up to a 14 mm thickness PS polyethylene insert we are pleased with range of motion and stability without insert.  We made our patella cut and drilled 3 ultra size 35 patella button.  Again with all trial instrumentation the knee we are pleased the range of motion and stability.  We then removed all transportation the knee and the knee was irrigated with normal saline solution.  We then placed Marcaine with epinephrine around the arthrotomy.  Next with the knee in a flexed position we mixed our cement and then cemented our Biomet/Zimmer persona tibial tray for a left knee size G with a small stubby stem.  We cemented our size 10 left TS femur.  We placed our 14 mm thickness PS polyethylene insert and cemented our size 35 patella button.  We did help the knee compressed and extended while the cement hardened.  Excess cement was removed.  The tourniquet was then let down and hemostasis was obtained with electrocautery.  The arthrotomy was closed with interrupted #1 Vicryl suture followed by 0 Vicryl for the deep tissue and 2-0 Vicryl  to close subcutaneous tissue.  Skin was closed staples.  Well-padded sterile dressings applied.  The patient was taken the recovery room.  Rexene Edison, PA-C did assist during entire case and beginning to end and his assistance was crucial and medically necessary for soft tissue management and retraction, helping guide implant placement and a layered closure of the wound.

## 2023-02-01 NOTE — Anesthesia Procedure Notes (Signed)
Spinal  Patient location during procedure: OR Start time: 02/01/2023 12:30 PM End time: 02/01/2023 12:35 PM Reason for block: surgical anesthesia Staffing Performed: anesthesiologist  Anesthesiologist: Lannie Fields, DO Performed by: Lannie Fields, DO Authorized by: Lannie Fields, DO   Preanesthetic Checklist Completed: patient identified, IV checked, risks and benefits discussed, surgical consent, monitors and equipment checked, pre-op evaluation and timeout performed Spinal Block Patient position: sitting Prep: DuraPrep and site prepped and draped Patient monitoring: cardiac monitor, continuous pulse ox and blood pressure Approach: midline Location: L3-4 Injection technique: single-shot Needle Needle type: Pencan  Needle gauge: 24 G Needle length: 9 cm Assessment Sensory level: T6 Events: CSF return Additional Notes Functioning IV was confirmed and monitors were applied. Sterile prep and drape, including hand hygiene and sterile gloves were used. The patient was positioned and the spine was prepped. The skin was anesthetized with lidocaine.  Free flow of clear CSF was obtained prior to injecting local anesthetic into the CSF.  The spinal needle aspirated freely following injection.  The needle was carefully withdrawn.  The patient tolerated the procedure well.

## 2023-02-01 NOTE — Discharge Instructions (Signed)
Per [facility] clinic policy, our goal is ensure optimal postoperative pain control with a multimodal pain management strategy. For all [facility] patients, our goal is to wean post-operative narcotic medications by 6 weeks post-operatively. If this is not possible due to utilization of pain medication prior to surgery, your [facility] doctor will support your acute post-operative pain control for the first 6 weeks postoperatively, with a plan to transition you back to your primary pain team following that. Keith Watkins will work to ensure a Therapist, occupational.  INSTRUCTIONS AFTER JOINT REPLACEMENT   Remove items at home which could result in a fall. This includes throw rugs or furniture in walking pathways ICE to the affected joint every three hours while awake for 30 minutes at a time, for at least the first 3-5 days, and then as needed for pain and swelling.  Continue to use ice for pain and swelling. You may notice swelling that will progress down to the foot and ankle.  This is normal after surgery.  Elevate your leg when you are not up walking on it.   Continue to use the breathing machine you got in the hospital (incentive spirometer) which will help keep your temperature down.  It is common for your temperature to cycle up and down following surgery, especially at night when you are not up moving around and exerting yourself.  The breathing machine keeps your lungs expanded and your temperature down.   DIET:  As you were doing prior to hospitalization, we recommend a well-balanced diet.  DRESSING / WOUND CARE / SHOWERING  Keep the surgical dressing until follow up.  The dressing is water proof, so you can shower without any extra covering.  IF THE DRESSING FALLS OFF or the wound gets wet inside, change the dressing with sterile gauze.  Please use good hand washing techniques before changing the dressing.  Do not use any lotions or creams on the incision until instructed by your surgeon.     ACTIVITY  Increase activity slowly as tolerated, but follow the weight bearing instructions below.   No driving for 6 weeks or until further direction given by your physician.  You cannot drive while taking narcotics.  No lifting or carrying greater than 10 lbs. until further directed by your surgeon. Avoid periods of inactivity such as sitting longer than an hour when not asleep. This helps prevent blood clots.  You may return to work once you are authorized by your doctor.     WEIGHT BEARING   Weight bearing as tolerated with assist device (walker, cane, etc) as directed, use it as long as suggested by your surgeon or therapist, typically at least 4-6 weeks.   EXERCISES  Results after joint replacement surgery are often greatly improved when you follow the exercise, range of motion and muscle strengthening exercises prescribed by your doctor. Safety measures are also important to protect the joint from further injury. Any time any of these exercises cause you to have increased pain or swelling, decrease what you are doing until you are comfortable again and then slowly increase them. If you have problems or questions, call your caregiver or physical therapist for advice.   Rehabilitation is important following a joint replacement. After just a few days of immobilization, the muscles of the leg can become weakened and shrink (atrophy).  These exercises are designed to build up the tone and strength of the thigh and leg muscles and to improve motion. Often times heat used for twenty to thirty minutes before  working out will loosen up your tissues and help with improving the range of motion but do not use heat for the first two weeks following surgery (sometimes heat can increase post-operative swelling).   These exercises can be done on a training (exercise) mat, on the floor, on a table or on a bed. Use whatever works the best and is most comfortable for you.    Use music or television  while you are exercising so that the exercises are a pleasant break in your day. This will make your life better with the exercises acting as a break in your routine that you can look forward to.   Perform all exercises about fifteen times, three times per day or as directed.  You should exercise both the operative leg and the other leg as well.  Exercises include:   Quad Sets - Tighten up the muscle on the front of the thigh (Quad) and hold for 5-10 seconds.   Straight Leg Raises - With your knee straight (if you were given a brace, keep it on), lift the leg to 60 degrees, hold for 3 seconds, and slowly lower the leg.  Perform this exercise against resistance later as your leg gets stronger.  Leg Slides: Lying on your back, slowly slide your foot toward your buttocks, bending your knee up off the floor (only go as far as is comfortable). Then slowly slide your foot back down until your leg is flat on the floor again.  Angel Wings: Lying on your back spread your legs to the side as far apart as you can without causing discomfort.  Hamstring Strength:  Lying on your back, push your heel against the floor with your leg straight by tightening up the muscles of your buttocks.  Repeat, but this time bend your knee to a comfortable angle, and push your heel against the floor.  You may put a pillow under the heel to make it more comfortable if necessary.   A rehabilitation program following joint replacement surgery can speed recovery and prevent re-injury in the future due to weakened muscles. Contact your doctor or a physical therapist for more information on knee rehabilitation.    CONSTIPATION  Constipation is defined medically as fewer than three stools per week and severe constipation as less than one stool per week.  Even if you have a regular bowel pattern at home, your normal regimen is likely to be disrupted due to multiple reasons following surgery.  Combination of anesthesia, postoperative  narcotics, change in appetite and fluid intake all can affect your bowels.   YOU MUST use at least one of the following options; they are listed in order of increasing strength to get the job done.  They are all available over the counter, and you may need to use some, POSSIBLY even all of these options:    Drink plenty of fluids (prune juice may be helpful) and high fiber foods Colace 100 mg by mouth twice a day  Senokot for constipation as directed and as needed Dulcolax (bisacodyl), take with full glass of water  Miralax (polyethylene glycol) once or twice a day as needed.  If you have tried all these things and are unable to have a bowel movement in the first 3-4 days after surgery call either your surgeon or your primary doctor.    If you experience loose stools or diarrhea, hold the medications until you stool forms back up.  If your symptoms do not get better within 1 week  or if they get worse, check with your doctor.  If you experience "the worst abdominal pain ever" or develop nausea or vomiting, please contact the office immediately for further recommendations for treatment.   ITCHING:  If you experience itching with your medications, try taking only a single pain pill, or even half a pain pill at a time.  You can also use Benadryl over the counter for itching or also to help with sleep.   TED HOSE STOCKINGS:  Use stockings on both legs until for at least 2 weeks or as directed by physician office. They may be removed at night for sleeping.  MEDICATIONS:  See your medication summary on the "After Visit Summary" that nursing will review with you.  You may have some home medications which will be placed on hold until you complete the course of blood thinner medication.  It is important for you to complete the blood thinner medication as prescribed.  PRECAUTIONS:  If you experience chest pain or shortness of breath - call 911 immediately for transfer to the hospital emergency department.    If you develop a fever greater that 101 F, purulent drainage from wound, increased redness or drainage from wound, foul odor from the wound/dressing, or calf pain - CONTACT YOUR SURGEON.                                                   FOLLOW-UP APPOINTMENTS:  If you do not already have a post-op appointment, please call the office for an appointment to be seen by your surgeon.  Guidelines for how soon to be seen are listed in your "After Visit Summary", but are typically between 1-4 weeks after surgery.  OTHER INSTRUCTIONS:   Knee Replacement:  Do not place pillow under knee, focus on keeping the knee straight while resting. CPM instructions: 0-90 degrees, 2 hours in the morning, 2 hours in the afternoon, and 2 hours in the evening. Place foam block, curve side up under heel at all times except when in CPM or when walking.  DO NOT modify, tear, cut, or change the foam block in any way.  POST-OPERATIVE OPIOID TAPER INSTRUCTIONS: It is important to wean off of your opioid medication as soon as possible. If you do not need pain medication after your surgery it is ok to stop day one. Opioids include: Codeine, Hydrocodone(Norco, Vicodin), Oxycodone(Percocet, oxycontin) and hydromorphone amongst others.  Long term and even short term use of opiods can cause: Increased pain response Dependence Constipation Depression Respiratory depression And more.  Withdrawal symptoms can include Flu like symptoms Nausea, vomiting And more Techniques to manage these symptoms Hydrate well Eat regular healthy meals Stay active Use relaxation techniques(deep breathing, meditating, yoga) Do Not substitute Alcohol to help with tapering If you have been on opioids for less than two weeks and do not have pain than it is ok to stop all together.  Plan to wean off of opioids This plan should start within one week post op of your joint replacement. Maintain the same interval or time between taking each dose  and first decrease the dose.  Cut the total daily intake of opioids by one tablet each day Next start to increase the time between doses. The last dose that should be eliminated is the evening dose.   MAKE SURE YOU:  Understand these instructions.  Get help right away if you are not doing well or get worse.    Thank you for letting us be a part of your medical care team.  It is a privilege we respect greatly.  We hope these instructions will help you stay on track for a fast and full recovery!      Dental Antibiotics:  In most cases prophylactic antibiotics for Dental procdeures after total joint surgery are not necessary.  Exceptions are as follows:  1. History of prior total joint infection  2. Severely immunocompromised (Organ Transplant, cancer chemotherapy, Rheumatoid biologic meds such as Humera)  3. Poorly controlled diabetes (A1C &gt; 8.0, blood glucose over 200)  If you have one of these conditions, contact your surgeon for an antibiotic prescription, prior to your dental procedure.

## 2023-02-01 NOTE — Anesthesia Postprocedure Evaluation (Signed)
Anesthesia Post Note  Patient: RIGO LETTS  Procedure(s) Performed: LEFT TOTAL KNEE ARTHROPLASTY (Left: Knee)     Patient location during evaluation: PACU Anesthesia Type: Regional, MAC and Spinal Level of consciousness: awake and alert and oriented Pain management: pain level controlled Vital Signs Assessment: post-procedure vital signs reviewed and stable Respiratory status: spontaneous breathing, nonlabored ventilation and respiratory function stable Cardiovascular status: blood pressure returned to baseline and stable Postop Assessment: no headache, no backache and spinal receding Anesthetic complications: no   No notable events documented.  Last Vitals:  Vitals:   02/01/23 1500 02/01/23 1515  BP: 101/60 104/74  Pulse: 70 66  Resp: 15 15  Temp:    SpO2: 91% 95%    Last Pain:  Vitals:   02/01/23 1500  TempSrc:   PainSc: 0-No pain                 Lannie Fields

## 2023-02-01 NOTE — Anesthesia Procedure Notes (Signed)
Anesthesia Regional Block: Adductor canal block   Pre-Anesthetic Checklist: , timeout performed,  Correct Patient, Correct Site, Correct Laterality,  Correct Procedure, Correct Position, site marked,  Risks and benefits discussed,  Surgical consent,  Pre-op evaluation,  At surgeon's request and post-op pain management  Laterality: Left  Prep: Maximum Sterile Barrier Precautions used, chloraprep       Needles:  Injection technique: Single-shot  Needle Type: Echogenic Stimulator Needle     Needle Length: 9cm  Needle Gauge: 22     Additional Needles:   Procedures:,,,, ultrasound used (permanent image in chart),,    Narrative:  Start time: 02/01/2023 10:55 AM End time: 02/01/2023 11:00 AM Injection made incrementally with aspirations every 5 mL.  Performed by: Personally  Anesthesiologist: Lannie Fields, DO  Additional Notes: Monitors applied. No increased pain on injection. No increased resistance to injection. Injection made in 5cc increments. Good needle visualization. Patient tolerated procedure well.

## 2023-02-02 DIAGNOSIS — R531 Weakness: Secondary | ICD-10-CM | POA: Diagnosis not present

## 2023-02-02 DIAGNOSIS — M1712 Unilateral primary osteoarthritis, left knee: Secondary | ICD-10-CM | POA: Diagnosis not present

## 2023-02-02 LAB — BASIC METABOLIC PANEL
Anion gap: 10 (ref 5–15)
BUN: 19 mg/dL (ref 8–23)
CO2: 26 mmol/L (ref 22–32)
Calcium: 8.9 mg/dL (ref 8.9–10.3)
Chloride: 102 mmol/L (ref 98–111)
Creatinine, Ser: 0.97 mg/dL (ref 0.61–1.24)
GFR, Estimated: >60 mL/min (ref >60)
Glucose, Bld: 133 mg/dL — ABNORMAL HIGH (ref 70–99)
Potassium: 4.2 mmol/L (ref 3.5–5.1)
Sodium: 138 mmol/L (ref 135–145)

## 2023-02-02 LAB — CBC
HCT: 38.9 % — ABNORMAL LOW (ref 39.0–52.0)
Hemoglobin: 12.9 g/dL — ABNORMAL LOW (ref 13.0–17.0)
MCH: 31.5 pg (ref 26.0–34.0)
MCHC: 33.2 g/dL (ref 30.0–36.0)
MCV: 95.1 fL (ref 80.0–100.0)
Platelets: 209 10*3/uL (ref 150–400)
RBC: 4.09 MIL/uL — ABNORMAL LOW (ref 4.22–5.81)
RDW: 13.4 % (ref 11.5–15.5)
WBC: 10.2 10*3/uL (ref 4.0–10.5)
nRBC: 0 % (ref 0.0–0.2)

## 2023-02-02 MED ORDER — GABAPENTIN 100 MG PO CAPS
100.0000 mg | ORAL_CAPSULE | Freq: Three times a day (TID) | ORAL | Status: DC
  Administered 2023-02-02 – 2023-02-04 (×6): 100 mg via ORAL
  Filled 2023-02-02 (×6): qty 1

## 2023-02-02 MED ORDER — METHOCARBAMOL 500 MG PO TABS: 500.0000 mg | ORAL_TABLET | Freq: Four times a day (QID) | ORAL | 1 refills | Status: DC | PRN

## 2023-02-02 MED ORDER — OXYCODONE HCL 5 MG PO TABS: 5.0000 mg | ORAL_TABLET | Freq: Four times a day (QID) | ORAL | 0 refills | Status: DC | PRN

## 2023-02-02 MED ORDER — KETOROLAC TROMETHAMINE 15 MG/ML IJ SOLN
7.5000 mg | Freq: Four times a day (QID) | INTRAMUSCULAR | Status: AC
  Administered 2023-02-02 – 2023-02-03 (×3): 7.5 mg via INTRAVENOUS
  Filled 2023-02-02 (×3): qty 1

## 2023-02-02 MED ORDER — ASPIRIN 81 MG PO CHEW: 81.0000 mg | CHEWABLE_TABLET | Freq: Two times a day (BID) | ORAL | 0 refills | Status: DC

## 2023-02-02 NOTE — Progress Notes (Signed)
Physical Therapy Treatment Patient Details Name: Keith Watkins MRN: [id number] DOB: 12-23-1945 Today's Date: 02/02/2023   History of Present Illness 78 y.o. male presents to [facility] hospital on 02/01/2023 for elective L TKA. PMH includes BPH, lumbar spinal stenosis, PVD.    PT Comments  Patient eager and willing to participate with physical therapy for the second time today. Objective measurements were: lacking 11 degrees of knee extension and 69 degrees of knee flexion. Patient did a great job demonstrating a sit > stand transfer, using bilateral UE and RLE support to help transition to an upright position. Patient was able to ambulate 36ft within the unit, requiring CGA +1 to ensure safety and only needing two rest breaks (one in standing, one seated). Patient required Min A +2 to ensure safety when climbing one step. Some tactile and verbal cues provided for foot clearance and negotiation with stairs. Patient limited due to pain and activity tolerance for stairs, with reports of his operative knee "buckling". Patient will continue to benefit from acute PT in order to improve ambulation and stair negotiation needed to reach an optimal level of independence.    If plan is discharge home, recommend the following: A little help with walking and/or transfers;A little help with bathing/dressing/bathroom;Assistance with cooking/housework;Assist for transportation;Help with stairs or ramp for entrance   Can travel by private vehicle        Equipment Recommendations  Rolling walker (2 wheels)    Recommendations for Other Services OT consult     Precautions / Restrictions Precautions Precautions: Fall Restrictions Weight Bearing Restrictions Per Provider Order: No LLE Weight Bearing Per Provider Order: Weight bearing as tolerated     Mobility  Bed Mobility Overal bed mobility: Needs Assistance Bed Mobility: Supine to Sit     Supine to sit: Min assist     General bed mobility comments:  Required assistance to move operative LLE to EOB    Transfers Overall transfer level: Needs assistance Equipment used: Rolling walker (2 wheels) Transfers: Sit to/from Stand Sit to Stand: Contact guard assist           General transfer comment: From recliner, good use of UE support and RLE support to transition from sit > stand    Ambulation/Gait Ambulation/Gait assistance: Contact guard assist, +2 safety/equipment Gait Distance (Feet): 30 Feet Assistive device: Rolling walker (2 wheels) Gait Pattern/deviations: Step-to pattern, Decreased stance time - left, Decreased stride length, Knee flexed in stance - left Gait velocity: Decreased     General Gait Details: Requried UE support to help push up when ambulating with RW, patient fatigue reported and requried two rest breaks (in-standing and seated)   Stairs Stairs: Yes Stairs assistance: Min assist Stair Management: One rail Right, With cane Number of Stairs: 1 General stair comments: Pt able to go up on step safely CGA +2 to ensure safety, unable to ascend more steps due to fatigue, pain, and reports of knee buckling   Wheelchair Mobility     Tilt Bed    Modified Rankin (Stroke Patients Only)       Balance Overall balance assessment: Needs assistance Sitting-balance support: No upper extremity supported, Feet supported Sitting balance-Leahy Scale: Good     Standing balance support: Reliant on assistive device for balance, Bilateral upper extremity supported Standing balance-Leahy Scale: Fair Standing balance comment: able to distribute weight through operative leg  Cognition Arousal: Alert Behavior During Therapy: WFL for tasks assessed/performed Overall Cognitive Status: Within Functional Limits for tasks assessed                                          Exercises Total Joint Exercises Ankle Circles/Pumps: AROM, Both, 10 reps, Supine Quad Sets:  AROM, Left, Other reps (comment), Supine, Strengthening (8reps) Short Arc Quad: AROM, Strengthening, Left, 5 reps, Supine Heel Slides: AROM, Left, 5 reps, Seated Hip ABduction/ADduction: AROM, Left, 5 reps, Supine, Strengthening    General Comments General comments (skin integrity, edema, etc.): Measurements: -11-69      Pertinent Vitals/Pain Pain Assessment Pain Assessment: 0-10 Pain Score: 7  Faces Pain Scale: Hurts even more Pain Location: Left behind Knee when extended/Quad when knee is flexed Pain Descriptors / Indicators: Grimacing, Guarding, Tender, Tightness Pain Intervention(s): Limited activity within patient's tolerance, Monitored during session, Premedicated before session, Ice applied    Home Living Family/patient expects to be discharged to:: Private residence Living Arrangements: Spouse/significant other;Children;Other relatives Available Help at Discharge: Family Type of Home: House Home Access: Ramped entrance       Home Layout: One level Home Equipment: Rollator (4 wheels);Cane - single point;Shower seat;Lift chair;Grab bars - tub/shower Additional Comments: Pt plans to d/c to daughter's home initially with 4 STE and railing on R side    Prior Function            PT Goals (current goals can now be found in the care plan section) Acute Rehab PT Goals Patient Stated Goal: Wants to improve mobility for ADLs and work PT Goal Formulation: With patient Time For Goal Achievement: 02/16/23 Potential to Achieve Goals: Good Progress towards PT goals: Progressing toward goals    Frequency    7X/week      PT Plan      Co-evaluation              AM-PAC PT "6 Clicks" Mobility   Outcome Measure  Help needed turning from your back to your side while in a flat bed without using bedrails?: A Little Help needed moving from lying on your back to sitting on the side of a flat bed without using bedrails?: A Little Help needed moving to and from a bed to a  chair (including a wheelchair)?: A Little Help needed standing up from a chair using your arms (e.g., wheelchair or bedside chair)?: A Little Help needed to walk in hospital room?: A Little Help needed climbing 3-5 steps with a railing? : A Lot 6 Click Score: 17    End of Session Equipment Utilized During Treatment: Gait belt Activity Tolerance: Patient limited by pain Patient left: in chair;with call bell/phone within reach;with family/visitor present Nurse Communication: Mobility status PT Visit Diagnosis: Other abnormalities of gait and mobility (R26.89);Muscle weakness (generalized) (M62.81);Pain Pain - Right/Left: Left Pain - part of body: Knee     Time: 1206-1240 PT Time Calculation (min) (ACUTE ONLY): 34 min  Charges:    $Gait Training: 8-22 mins $Therapeutic Activity: 8-22 mins PT General Charges $$ ACUTE PT VISIT: 1 Visit                     Doreen Beam, SPT   Natashia Roseman 02/02/2023, 1:01 PM

## 2023-02-02 NOTE — Evaluation (Signed)
Physical Therapy Evaluation Patient Details Name: Keith Watkins MRN: [id number] DOB: [birth year] Today's Date: 02/02/2023  History of Present Illness  78 y.o. male presents to [facility] hospital on 02/01/2023 for elective L TKA. PMH includes BPH, lumbar spinal stenosis, PVD.  Clinical Impression  Patient lives independently at home with his wife and is currently working. He plans on being discharged home to his daughter's house because the patient's son in law will be able to better assist with transfers. The session began with a review of his HEP and the patient acknowledged that he will be compliant. The patient's primary concern is his limited mobility, hence the reason he elected to get a L TKA. The patient was Min A +1 during bed mobility (supine > sit) and required assistance to move his operative leg to the EOB. The patient was able to demonstrate initial activation of his L hip adductors, however had some difficulty controlling his leg down to the floor. The patient required Mod A +1 when transitioning from a sit > stand, demonstrating difficulty ascending even with UE support. Patient's bed was elevated to ease transition. The patient required CGA +1 during ambulation and was able to walk 97ft in his room due to UE fatigue when pushing up on the RW and pain in the LLE. The patient will continue to benefit from acute PT in order to improve transfers, ambulation, and stair negotiation needed to reach an optimal level of independence.       If plan is discharge home, recommend the following: A little help with walking and/or transfers;A little help with bathing/dressing/bathroom;Assistance with cooking/housework;Assist for transportation;Help with stairs or ramp for entrance   Can travel by private vehicle        Equipment Recommendations Rolling walker (2 wheels) (bariatric RW)  Recommendations for Other Services  OT consult    Functional Status Assessment Patient has had a recent decline in their  functional status and demonstrates the ability to make significant improvements in function in a reasonable and predictable amount of time.     Precautions / Restrictions Precautions Precautions: Fall Restrictions Weight Bearing Restrictions Per Provider Order: Yes LLE Weight Bearing Per Provider Order: Weight bearing as tolerated      Mobility  Bed Mobility Overal bed mobility: Needs Assistance Bed Mobility: Supine to Sit     Supine to sit: Min assist     General bed mobility comments: Required assistance to move operative LLE to EOB    Transfers Overall transfer level: Needs assistance Equipment used: Rolling walker (2 wheels) Transfers: Sit to/from Stand Sit to Stand: Mod assist           General transfer comment: Patient required assistance when ascending from a seated position. Pt used  some UE support and RLE support to transition from a sit > stand    Ambulation/Gait Ambulation/Gait assistance: Contact guard assist Gait Distance (Feet): 10 Feet Assistive device: Rolling walker (2 wheels) Gait Pattern/deviations: Step-to pattern, Decreased stance time - left, Decreased stride length, Knee flexed in stance - left Gait velocity: Decreased     General Gait Details: Requried UE support to help push up when ambulating with RW, patient fatigue reported and was only able to ambulate 54ft  Stairs            Wheelchair Mobility     Tilt Bed    Modified Rankin (Stroke Patients Only)       Balance Overall balance assessment: Modified Independent  Pertinent Vitals/Pain Pain Assessment Pain Assessment: Faces Faces Pain Scale: Hurts even more Pain Location: Left Knee/Quad Pain Descriptors / Indicators: Grimacing, Guarding, Sharp, Tender, Tightness Pain Intervention(s): Limited activity within patient's tolerance, Monitored during session, Ice applied    Home Living Family/patient expects to  be discharged to:: Private residence Living Arrangements: Spouse/significant other;Children;Other relatives Available Help at Discharge: Family Type of Home: House Home Access: Ramped entrance       Home Layout: One level Home Equipment: Rollator (4 wheels);Cane - single point;Shower seat;BSC/3in1;Lift chair Additional Comments: Pt plans to d/c to daughter's home initially with 4 STE and railing on R side    Prior Function Prior Level of Function : Independent/Modified Independent             Mobility Comments: Pain in the L knee limited patient's mobility, especially when he was working. Had to sit down for most of the day ADLs Comments: Reports that he had to have a urinal nearby when in bed due to limited mobility and secondary medical complication with his bladder     Extremity/Trunk Assessment   Upper Extremity Assessment Upper Extremity Assessment: Overall WFL for tasks assessed    Lower Extremity Assessment Lower Extremity Assessment: LLE deficits/detail LLE Deficits / Details: Tightness and TTP localized near the L quadricep LLE: Unable to fully assess due to pain LLE Sensation: WNL LLE Coordination: decreased gross motor    Cervical / Trunk Assessment Cervical / Trunk Assessment: Normal  Communication   Communication Communication: No apparent difficulties  Cognition Arousal: Alert Behavior During Therapy: WFL for tasks assessed/performed Overall Cognitive Status: Within Functional Limits for tasks assessed                                          General Comments General comments (skin integrity, edema, etc.): Patient mainly concerned with mobility    Exercises Total Joint Exercises Ankle Circles/Pumps: AROM, Both, 10 reps, Supine Quad Sets: AROM, Left, Other reps (comment), Supine, Strengthening (8reps) Short Arc Quad: AROM, Strengthening, Left, 5 reps, Supine Heel Slides: AROM, Left, 5 reps, Seated Hip ABduction/ADduction: AROM,  Left, 5 reps, Supine, Strengthening   Assessment/Plan    PT Assessment Patient needs continued PT services  PT Problem List Decreased strength;Decreased range of motion;Decreased activity tolerance;Decreased mobility;Pain;Decreased balance       PT Treatment Interventions DME instruction;Gait training;Stair training;Functional mobility training;Therapeutic activities;Therapeutic exercise;Balance training;Neuromuscular re-education;Modalities    PT Goals (Current goals can be found in the Care Plan section)  Acute Rehab PT Goals Patient Stated Goal: Wants to improve mobility for ADLs and work PT Goal Formulation: With patient Time For Goal Achievement: 02/16/23 Potential to Achieve Goals: Good    Frequency Min 1X/week     Co-evaluation               AM-PAC PT "6 Clicks" Mobility  Outcome Measure Help needed turning from your back to your side while in a flat bed without using bedrails?: A Little Help needed moving from lying on your back to sitting on the side of a flat bed without using bedrails?: A Little Help needed moving to and from a bed to a chair (including a wheelchair)?: A Little Help needed standing up from a chair using your arms (e.g., wheelchair or bedside chair)?: A Lot Help needed to walk in hospital room?: A Little Help needed climbing 3-5 steps with a railing? : A  Lot 6 Click Score: 16    End of Session Equipment Utilized During Treatment: Gait belt Activity Tolerance: Patient limited by pain Patient left: in chair;with call bell/phone within reach;with family/visitor present Nurse Communication: Mobility status;Other (comment) (Pre-medicated before next session - nurse notified) PT Visit Diagnosis: Other abnormalities of gait and mobility (R26.89);Muscle weakness (generalized) (M62.81);Pain Pain - Right/Left: Left Pain - part of body: Knee    Time: 0805-0855 PT Time Calculation (min) (ACUTE ONLY): 50 min   Charges:   PT Evaluation $PT Eval Low  Complexity: 1 Low PT Treatments $Gait Training: 8-22 mins $Therapeutic Exercise: 8-22 mins PT General Charges $$ ACUTE PT VISIT: 1 Visit         Doreen Beam, SPT   Jemuel Laursen 02/02/2023, 9:36 AM

## 2023-02-02 NOTE — Progress Notes (Signed)
DME equipments given to patient with family at bedside. Bariatric Walker and Bariatric BSC.Patient's daughter took equipments home

## 2023-02-02 NOTE — Evaluation (Signed)
Occupational Therapy Evaluation Patient Details Name: Keith Watkins MRN: [id number] DOB: 1945/12/18 Today's Date: 02/02/2023   History of Present Illness 78 y.o. male presents to [facility] hospital on 02/01/2023 for elective L TKA. PMH includes BPH, lumbar spinal stenosis, PVD.   Clinical Impression   This 78 yo male admitted and underwent above presents to acute OT with PLOF of being Mod I to independent with basic ADLs and still working in a job that requires a lot of standing. He will continue to benefit from acute OT without need for follow up.       If plan is discharge home, recommend the following: A lot of help with bathing/dressing/bathroom;A little help with walking and/or transfers;Help with stairs or ramp for entrance;Assist for transportation;Assistance with cooking/housework    Functional Status Assessment  Patient has had a recent decline in their functional status and demonstrates the ability to make significant improvements in function in a reasonable and predictable amount of time.  Equipment Recommendations   (wide/bariatric 3n1)       Precautions / Restrictions Precautions Precautions: Fall Restrictions Weight Bearing Restrictions Per Provider Order: No LLE Weight Bearing Per Provider Order: Weight bearing as tolerated      Mobility Bed Mobility               General bed mobility comments: Pt up in recliner during treatment session    Transfers Overall transfer level: Needs assistance Equipment used: Rolling walker (2 wheels) Transfers: Sit to/from Stand Sit to Stand: Contact guard assist           General transfer comment: From recliner x2      Balance Overall balance assessment: Needs assistance Sitting-balance support: No upper extremity supported, Feet supported Sitting balance-Leahy Scale: Good     Standing balance support: No upper extremity supported Standing balance-Leahy Scale: Fair Standing balance comment: standing to let me adjust  RW height                           ADL either performed or assessed with clinical judgement   ADL Overall ADL's : Needs assistance/impaired Eating/Feeding: Independent;Sitting   Grooming: Set up;Sitting   Upper Body Bathing: Set up;Sitting   Lower Body Bathing: Moderate assistance Lower Body Bathing Details (indicate cue type and reason): CGA sit<>stand Upper Body Dressing : Set up;Sitting   Lower Body Dressing: Moderate assistance Lower Body Dressing Details (indicate cue type and reason): CGA sit<>stand Toilet Transfer: Contact guard assist;Ambulation;Rolling walker (2 wheels) Toilet Transfer Details (indicate cue type and reason): simulated recliner>5 steps forward/back>sit in recliner Toileting- Clothing Manipulation and Hygiene: Contact guard assist;Sit to/from stand               Vision Patient Visual Report: No change from baseline              Pertinent Vitals/Pain Pain Assessment Pain Assessment: 0-10 Faces Pain Scale: Hurts whole lot Pain Location: Left behind Knee when extended/Quad when knee is flexed Pain Descriptors / Indicators: Grimacing, Guarding, Tender, Tightness Pain Intervention(s): Limited activity within patient's tolerance, Monitored during session, Premedicated before session, Repositioned (educated to place ice on knee after he does his 30 minutes (5 minutes extended knee and 5 minutes bent knee while seated with legs down in recliner))     Extremity/Trunk Assessment Upper Extremity Assessment Upper Extremity Assessment: Overall WFL for tasks assessed      Communication Communication Communication: No apparent difficulties   Cognition Arousal: Alert Behavior  During Therapy: WFL for tasks assessed/performed Overall Cognitive Status: Within Functional Limits for tasks assessed                                       General Comments  Patient mainly concerned with mobility            Home Living  Family/patient expects to be discharged to:: Private residence Living Arrangements: Spouse/significant other;Children;Other relatives Available Help at Discharge: Family Type of Home: House Home Access: Ramped entrance     Home Layout: One level     Bathroom Shower/Tub: Producer, television/film/video: Handicapped height     Home Equipment: Rollator (4 wheels);Cane - single point;Shower seat;Lift chair;Grab bars - tub/shower   Additional Comments: Pt plans to d/c to daughter's home initially with 4 STE and railing on R side      Prior Functioning/Environment Prior Level of Function : Independent/Modified Independent             Mobility Comments: Pain in the L knee limited patient's mobility, especially when he was working. Had to sit down for most of the day ADLs Comments: Reports that he had to have a urinal nearby when in bed due to limited mobility and secondary medical complication with his bladder--educated pt and family on a floor based urinal that may be of benefit to him.        OT Problem List: Decreased strength;Decreased range of motion;Impaired balance (sitting and/or standing);Pain      OT Treatment/Interventions: Self-care/ADL training;DME and/or AE instruction;Visual/perceptual remediation/compensation;Patient/family education;Balance training    OT Goals(Current goals can be found in the care plan section) Acute Rehab OT Goals Patient Stated Goal: to go home today or tomorrow OT Goal Formulation: With patient/family Time For Goal Achievement: 02/16/23 Potential to Achieve Goals: Good  OT Frequency: Min 1X/week       AM-PAC OT "6 Clicks" Daily Activity     Outcome Measure Help from another person eating meals?: None Help from another person taking care of personal grooming?: A Little Help from another person toileting, which includes using toliet, bedpan, or urinal?: A Little Help from another person bathing (including washing, rinsing, drying)?: A  Little Help from another person to put on and taking off regular upper body clothing?: A Little Help from another person to put on and taking off regular lower body clothing?: A Lot 6 Click Score: 18   End of Session Equipment Utilized During Treatment: Gait belt;Rolling walker (2 wheels) Nurse Communication: Patient requests pain meds  Activity Tolerance: Patient tolerated treatment well Patient left: in chair;with call bell/phone within reach;with family/visitor present  OT Visit Diagnosis: Unsteadiness on feet (R26.81);Other abnormalities of gait and mobility (R26.89);Muscle weakness (generalized) (M62.81);Pain Pain - Right/Left: Left Pain - part of body: Knee;Leg                Time: 8119-1478 OT Time Calculation (min): 47 min Charges:  OT General Charges $OT Visit: 1 Visit OT Evaluation $OT Eval Moderate Complexity: 1 Mod OT Treatments $Self Care/Home Management : 23-37 mins  Lindon Romp OT Acute Rehabilitation Services Office [phone]    Evette Georges 02/02/2023, 11:23 AM

## 2023-02-02 NOTE — Progress Notes (Signed)
    Durable Medical Equipment  (From admission, onward)           Start     Ordered   02/02/23 1030  DME Walker rolling  Once       Comments: Wide walker  Question Answer Comment  Walker: With 5 Inch Wheels   Patient needs a walker to treat with the following condition Status post total left knee replacement      02/02/23 1029   02/01/23 1618  DME 3 n 1  Once        02/01/23 1617              Pt needs a wife walker due to body habitus. He can not safely use a regular sized walker.

## 2023-02-02 NOTE — Progress Notes (Signed)
    Durable Medical Equipment  (From admission, onward)           Start     Ordered   02/02/23 1123  DME 3 n 1  Once       Comments: Wide BSC due to body habitus   02/02/23 1123   02/02/23 1030  DME Walker rolling  Once       Comments: Wide walker  Question Answer Comment  Walker: With 5 Inch Wheels   Patient needs a walker to treat with the following condition Status post total left knee replacement      02/02/23 1029            Pt requires a wide Bedside commode due to his body habitus. He can not safely use a regular sized bedside commode.

## 2023-02-02 NOTE — Plan of Care (Signed)
  Problem: Acute Rehab PT Goals(only PT should resolve) Goal: Pt Will Go Supine/Side To Sit Outcome: Progressing Flowsheets (Taken 02/02/2023 0938) Pt will go Supine/Side to Sit: with supervision Goal: Patient Will Transfer Sit To/From Stand Outcome: Progressing Flowsheets (Taken 02/02/2023 [phone]) Patient will transfer sit to/from stand: with modified independence Goal: Pt Will Perform Standing Balance Or Pre-Gait Outcome: Progressing Flowsheets (Taken 02/02/2023 0938) Pt will perform standing balance or pre-gait: with Modified Independent Goal: Pt Will Ambulate Outcome: Progressing Flowsheets (Taken 02/02/2023 0938) Pt will Ambulate:  100 feet  with least restrictive assistive device  with rolling walker Goal: Pt Will Go Up/Down Stairs Outcome: Progressing Flowsheets (Taken 02/02/2023 0938) Pt will Go Up / Down Stairs:  3-5 stairs  with least restrictive assistive device  with rail(s) Goal: Pt Will Verbalize and Adhere to Precautions While Description: PT Will Verbalize and Adhere to Precautions While Performing Mobility Outcome: Progressing Goal: Pt/caregiver will Perform Home Exercise Program Outcome: Progressing

## 2023-02-02 NOTE — Care Management Obs Status (Signed)
MEDICARE OBSERVATION STATUS NOTIFICATION   Patient Details  Name: Keith Watkins MRN: [id number] Date of Birth: [birth year]   Medicare Observation Status Notification Given:  Yes    Kermit Balo, RN 02/02/2023, 4:31 PM

## 2023-02-02 NOTE — Progress Notes (Signed)
Patient arrived to floor via bed and nurse. Family with patient as well. Patient alert and oriented. Continues to have c/o pain to left knee, but reports "some better." Ice in place at this time.

## 2023-02-02 NOTE — Care Plan (Signed)
Ortho Bundle Case Management Note  Patient Details  Name: Keith Watkins MRN: [id number] Date of Birth: 1945-08-31  [facility] RNCM call to patient to discuss his upcoming Left total knee arthroplasty with Dr. Magnus Ivan on 02/01/23 at [facility]. He is agreeable to case management. He lives with his spouse, who will be assisting him after discharge home. He has a rollator, rolling standard walker, commode chair, shower chair, and cane. No DME needed. Anticipate HHPT will be needed after a short hospital stay. Referral made to [facility] after choice provided. Reviewed post op care instructions and questions answered. Will continue to follow for needs.                   DME Arranged:   (Patient reports he has a rollator, commode chair, shower chair and cane; pretty sure he has a standard RW- he will check and let us know at hospital) DME Agency:     HH Arranged:  PT HH Agency:  [facility]  Additional Comments: Please contact me with any questions of if this plan should need to change.  Ralph Dowdy, RN, BSN, [employer]  [phone] 02/02/2023, 8:32 AM

## 2023-02-02 NOTE — Progress Notes (Signed)
Subjective: 1 Day Post-Op Procedure(s) (LRB): LEFT TOTAL KNEE ARTHROPLASTY (Left) Patient reports pain as moderate.    Objective: Vital signs in last 24 hours: Temp:  [97.4 F (36.3 C)-98.4 F (36.9 C)] 97.6 F (36.4 C) (01/29 0336) Pulse Rate:  [59-95] 93 (01/29 0336) Resp:  [13-29] 20 (01/29 0336) BP: (93-140)/(56-122) 112/66 (01/29 0336) SpO2:  [91 %-100 %] 98 % (01/29 0336) Weight:  [123.4 kg] 123.4 kg (01/28 1014)  Intake/Output from previous day: 01/28 0701 - 01/29 0700 In: 2380 [P.O.:480; I.V.:1700; IV Piggyback:200] Out: 875 [Urine:850; Blood:25] Intake/Output this shift: No intake/output data recorded.  No results for input(s): "HGB" in the last 72 hours. No results for input(s): "WBC", "RBC", "HCT", "PLT" in the last 72 hours. No results for input(s): "NA", "K", "CL", "CO2", "BUN", "CREATININE", "GLUCOSE", "CALCIUM" in the last 72 hours. No results for input(s): "LABPT", "INR" in the last 72 hours.  Sensation intact distally Intact pulses distally Dorsiflexion/Plantar flexion intact Incision: dressing C/D/I Compartment soft   Assessment/Plan: 1 Day Post-Op Procedure(s) (LRB): LEFT TOTAL KNEE ARTHROPLASTY (Left) Up with therapy Discharge home with home health      Kathryne Hitch 02/02/2023, 7:41 AM

## 2023-02-03 ENCOUNTER — Encounter (HOSPITAL_COMMUNITY): Payer: Self-pay | Admitting: Orthopaedic Surgery

## 2023-02-03 DIAGNOSIS — M1712 Unilateral primary osteoarthritis, left knee: Secondary | ICD-10-CM | POA: Diagnosis not present

## 2023-02-03 LAB — CBC
HCT: 33.3 % — ABNORMAL LOW (ref 39.0–52.0)
Hemoglobin: 10.9 g/dL — ABNORMAL LOW (ref 13.0–17.0)
MCH: 31.2 pg (ref 26.0–34.0)
MCHC: 32.7 g/dL (ref 30.0–36.0)
MCV: 95.4 fL (ref 80.0–100.0)
Platelets: 157 10*3/uL (ref 150–400)
RBC: 3.49 MIL/uL — ABNORMAL LOW (ref 4.22–5.81)
RDW: 13.6 % (ref 11.5–15.5)
WBC: 9.4 10*3/uL (ref 4.0–10.5)
nRBC: 0 % (ref 0.0–0.2)

## 2023-02-03 MED ORDER — GABAPENTIN 100 MG PO CAPS: 100.0000 mg | ORAL_CAPSULE | Freq: Three times a day (TID) | ORAL | 0 refills | Status: DC | PRN

## 2023-02-03 MED ORDER — SODIUM CHLORIDE 0.9 % IV BOLUS
500.0000 mL | Freq: Once | INTRAVENOUS | Status: AC
  Administered 2023-02-03: 500 mL via INTRAVENOUS

## 2023-02-03 NOTE — Progress Notes (Signed)
Physical Therapy Treatment Patient Details Name: Keith Watkins MRN: [id number] DOB: 19-Jun-1945 Today's Date: 02/03/2023   History of Present Illness 78 y.o. male presents to [facility] hospital on 02/01/2023 for elective L TKA. PMH includes BPH, lumbar spinal stenosis, PVD.    PT Comments  Good progress and carryover from prior PT visit. Able to navigate 4 steps similar to home set-up/environment at a CGA level with RW, using posterior approach and single rail as needed. Reviewed sequencing, wife present, recorded for recall. Pt would like to review one more time this afternoon for confidence and ensure safety. Able to ambulate 55 feet at supervision level, heavy UE support for comfort on RW. No buckling or overt LOB with this distance. Reviewed precautions and Iceman use. Patient will continue to benefit from skilled physical therapy services to further improve independence with functional mobility.      If plan is discharge home, recommend the following: A little help with walking and/or transfers;A little help with bathing/dressing/bathroom;Assistance with cooking/housework;Assist for transportation;Help with stairs or ramp for entrance   Can travel by private vehicle        Equipment Recommendations  Rolling walker (2 wheels)    Recommendations for Other Services       Precautions / Restrictions Precautions Precautions: Fall Restrictions Weight Bearing Restrictions Per Provider Order: Yes LLE Weight Bearing Per Provider Order: Weight bearing as tolerated     Mobility  Bed Mobility Overal bed mobility: Needs Assistance Bed Mobility: Supine to Sit, Sit to Supine     Supine to sit: Supervision Sit to supine: Supervision   General bed mobility comments: Supervision for safety, utilized sheet for pt to mobilize LLE as needed which was very beneficial after instruction.    Transfers Overall transfer level: Needs assistance Equipment used: Rolling walker (2 wheels) Transfers: Sit  to/from Stand Sit to Stand: Contact guard assist           General transfer comment: CGA for safety, rose from bed, and recliner x2. Educated on hand placement, RW set-up, and forward weight shift with Lt knee flexed as tolerated. Slow and effortful but without assist.    Ambulation/Gait Ambulation/Gait assistance: Supervision Gait Distance (Feet): 55 Feet Assistive device: Rolling walker (2 wheels) Gait Pattern/deviations: Step-to pattern, Decreased stance time - left, Decreased stride length, Knee flexed in stance - left, Decreased weight shift to left Gait velocity: Decreased Gait velocity interpretation: <1.31 ft/sec, indicative of household ambulator   General Gait Details: Moderately antalgic step-to pattern. Cues for RW placement to avoid getting too close to front of RW and risk posterior instability. Required supervision for increased ambulatory distance today. No buckling but heavy use of RW to reduce WB through LLE at this time. UEs fatigue. No overt LOB.   Stairs Stairs: Yes Stairs assistance: Contact guard assist Stair Management: One rail Right, Step to pattern, Backwards, With walker Number of Stairs: 4 General stair comments: Educated via demonstration of multiple techniques. Decided to attempt posterior approach with RW. Using rail with Lt hand backwards Rt hand on RW to ascend and descend 4 steps similar to home environment. Does have some Rt knee weakness on descent but with rail, RW, and CGA for RW block, pt able to navigate safely. He would like to try again this afternoon to feel more confident. Wife present, observed and recorded for recall of sequencing at d/c.   Wheelchair Mobility     Tilt Bed    Modified Rankin (Stroke Patients Only)  Balance Overall balance assessment: Needs assistance Sitting-balance support: No upper extremity supported, Feet supported Sitting balance-Leahy Scale: Good     Standing balance support: Reliant on assistive  device for balance, Bilateral upper extremity supported Standing balance-Leahy Scale: Poor Standing balance comment: able to distribute weight through operative leg                            Cognition Arousal: Alert Behavior During Therapy: WFL for tasks assessed/performed Overall Cognitive Status: Within Functional Limits for tasks assessed                                          Exercises      General Comments General comments (skin integrity, edema, etc.): Reviewed precautions. Iceman applied      Pertinent Vitals/Pain Pain Assessment Pain Assessment: Faces Faces Pain Scale: Hurts little more Pain Location: Left behind Knee Pain Descriptors / Indicators: Aching, Guarding Pain Intervention(s): Monitored during session, Repositioned, Limited activity within patient's tolerance, Ice applied    Home Living                          Prior Function            PT Goals (current goals can now be found in the care plan section) Acute Rehab PT Goals Patient Stated Goal: Wants to improve mobility for ADLs and work PT Goal Formulation: With patient Time For Goal Achievement: 02/16/23 Potential to Achieve Goals: Good Progress towards PT goals: Progressing toward goals    Frequency    7X/week      PT Plan      Co-evaluation              AM-PAC PT "6 Clicks" Mobility   Outcome Measure  Help needed turning from your back to your side while in a flat bed without using bedrails?: A Little Help needed moving from lying on your back to sitting on the side of a flat bed without using bedrails?: A Little Help needed moving to and from a bed to a chair (including a wheelchair)?: A Little Help needed standing up from a chair using your arms (e.g., wheelchair or bedside chair)?: A Little Help needed to walk in hospital room?: A Little Help needed climbing 3-5 steps with a railing? : A Little 6 Click Score: 18    End of Session  Equipment Utilized During Treatment: Gait belt Activity Tolerance: Patient tolerated treatment well Patient left: with call bell/phone within reach;with family/visitor present;in bed;with bed alarm set;with nursing/sitter in room Nurse Communication: Mobility status PT Visit Diagnosis: Other abnormalities of gait and mobility (R26.89);Muscle weakness (generalized) (M62.81);Pain Pain - Right/Left: Left Pain - part of body: Knee     Time: 0929-1016 PT Time Calculation (min) (ACUTE ONLY): 47 min  Charges:    $Gait Training: 23-37 mins $Therapeutic Activity: 8-22 mins PT General Charges $$ ACUTE PT VISIT: 1 Visit                     Kathlyn Keith, PT, DPT [facility]  Rehabilitation Services Physical Therapist Office: [phone] Website: [email or web address]    Berton Mount 02/03/2023, 11:29 AM

## 2023-02-03 NOTE — Progress Notes (Signed)
O2 weaned to Room air. Will continue to monitor

## 2023-02-03 NOTE — Progress Notes (Signed)
Patient ID: Keith Watkins, male   DOB: Apr 25, 1945, 78 y.o.   MRN: [id number] The patient was moved from [facility] to 2 W. due to his limited mobility.  He is awake and alert this morning with his wife at the bedside.  He needs continued therapy to work on his mobility.  He has a few steps he has to clear to get into his family's house.  PT has not worked them yet this morning.  Once he clears therapy he can be discharged to home.  His left operative knee is stable.  Hopefully he can be discharged home this afternoon if he makes progress with therapy.

## 2023-02-03 NOTE — Progress Notes (Signed)
Transition of Care [facility]) - Inpatient Brief Assessment   Patient Details  Name: Keith Watkins MRN: [id number] Date of Birth: August 17, 1945  Transition of Care [facility] CM/SW Contact:    Janae Bridgeman, RN Phone Number: 02/03/2023, 10:43 AM   Clinical Narrative: CM met with the patient and family at the bedside to discuss TOC needs to return home today/tomorrow once patient is able to mobilize safely with therapy.  Patient is S/P surgery for Left knee and currently has Iceman machine for Left knee and is on room air.  DME at the home includes RW, 3:1.  I called and spoke with Audrea Muscat, CM with [facility] and they are following the patient for home health services.  Patient's home health services were set up by the orthopedic office prior to  surgery yesterday.  No other needs and patient plans to return home with family/ home health services - once cleared by therapy.   Transition of Care Asessment: Insurance and Status: Insurance coverage has been reviewed Patient has primary care physician: Yes Home environment has been reviewed: from home with family Prior level of function:: (P) family assistance Prior/Current Home Services: (P) No current home services Social Drivers of Health Review: (P) SDOH reviewed interventions complete Readmission risk has been reviewed: (P) Yes Transition of care needs: (P) transition of care needs identified, TOC will continue to follow

## 2023-02-03 NOTE — Progress Notes (Signed)
Physical Therapy Treatment Patient Details Name: Keith Watkins MRN: [id number] DOB: Jan 03, 1946 Today's Date: 02/03/2023   History of Present Illness 78 y.o. male presents to [facility] hospital on 02/01/2023 for elective L TKA. PMH includes BPH, lumbar spinal stenosis, PVD.    PT Comments  Pt admitted with above diagnosis. Requested review of stair navigation again. Family present and actively participated. Able to safely complete at [facility] level for RW block. Set-up similar to home environment. Ambulated ~60 feet with improved pace, increased WB tolerance this afternoon. Less UE fatigue reported. Pt reports multiple family members available to assist at d/c and home equipment has been delivered (except [place].) Adequate for d/c from a mobility standpoint to start home therapies and progress gait and function. Family concerned about resting O2 sats and BP during admission. RN notified. Pt currently with functional limitations due to the deficits listed below (see PT Problem List). Pt will benefit from acute skilled PT to increase their independence and safety with mobility to allow discharge.       If plan is discharge home, recommend the following: A little help with walking and/or transfers;A little help with bathing/dressing/bathroom;Assistance with cooking/housework;Assist for transportation;Help with stairs or ramp for entrance   Can travel by private vehicle        Equipment Recommendations  Rolling walker (2 wheels)    Recommendations for Other Services       Precautions / Restrictions Precautions Precautions: Fall Restrictions Weight Bearing Restrictions Per Provider Order: Yes LLE Weight Bearing Per Provider Order: Weight bearing as tolerated     Mobility  Bed Mobility Overal bed mobility: Needs Assistance Bed Mobility: Supine to Sit     Supine to sit: Supervision, HOB elevated Sit to supine: Supervision   General bed mobility comments: Used bed to elevated LLE out of bed, good  recall. Extra time, no physical assist.    Transfers Overall transfer level: Needs assistance Equipment used: Rolling walker (2 wheels) Transfers: Sit to/from Stand Sit to Stand: Supervision           General transfer comment: Supervision for safety, RW placed in correct position. Effortful rise from bed but stable once upright with RW for support. performed from recliner x2 with greater ease as he is able to push from arm rests.    Ambulation/Gait Ambulation/Gait assistance: Supervision Gait Distance (Feet): 60 Feet Assistive device: Rolling walker (2 wheels) Gait Pattern/deviations: Step-to pattern, Decreased stance time - left, Decreased stride length, Knee flexed in stance - left, Decreased weight shift to left, Antalgic Gait velocity: Decreased Gait velocity interpretation: <1.8 ft/sec, indicate of risk for recurrent falls   General Gait Details: Improved pace and WB through operative extremity. Antalgic but stable with forward direction and turns within RW. Good control of AD. No buckling, adequate UE strength to support himself.   Stairs Stairs: Yes Stairs assistance: Contact guard assist Stair Management: One rail Right, Step to pattern, Backwards, With walker Number of Stairs: 4 General stair comments: Reviewed once again per pt request. Had family actively participate this time. Again performed posterior approach with RW and rail. Recalls sequencing. Reviewed techniques and guard, RW block with family. CGA for safety. Rt knee crepitus on descent but is able to adequately control without overt buckling.   Wheelchair Mobility     Tilt Bed    Modified Rankin (Stroke Patients Only)       Balance Overall balance assessment: Needs assistance Sitting-balance support: No upper extremity supported, Feet supported Sitting balance-Leahy Scale: Good  Standing balance support: Reliant on assistive device for balance, Bilateral upper extremity supported Standing  balance-Leahy Scale: Poor Standing balance comment: RW for support due to knee pain                            Cognition Arousal: Alert Behavior During Therapy: WFL for tasks assessed/performed Overall Cognitive Status: Within Functional Limits for tasks assessed                                          Exercises Other Exercises Other Exercises: Verbally reviewed ea exercise on HEP, states he understands.    General Comments General comments (skin integrity, edema, etc.): Reviewed precautions, modalities. Pt denies dizziness throughout session.      Pertinent Vitals/Pain Pain Assessment Pain Assessment: Faces Faces Pain Scale: Hurts little more Pain Location: Left behind Knee Pain Descriptors / Indicators: Aching, Guarding Pain Intervention(s): Monitored during session    Home Living                          Prior Function            PT Goals (current goals can now be found in the care plan section) Acute Rehab PT Goals Patient Stated Goal: Go home, return to work when better. PT Goal Formulation: With patient Time For Goal Achievement: 02/16/23 Potential to Achieve Goals: Good Progress towards PT goals: Progressing toward goals    Frequency    7X/week      PT Plan      Co-evaluation              AM-PAC PT "6 Clicks" Mobility   Outcome Measure  Help needed turning from your back to your side while in a flat bed without using bedrails?: None Help needed moving from lying on your back to sitting on the side of a flat bed without using bedrails?: A Little Help needed moving to and from a bed to a chair (including a wheelchair)?: A Little Help needed standing up from a chair using your arms (e.g., wheelchair or bedside chair)?: A Little Help needed to walk in hospital room?: A Little Help needed climbing 3-5 steps with a railing? : A Little 6 Click Score: 19    End of Session Equipment Utilized During Treatment:  Gait belt Activity Tolerance: Patient tolerated treatment well Patient left: with call bell/phone within reach;in chair;with chair alarm set;with family/visitor present   PT Visit Diagnosis: Other abnormalities of gait and mobility (R26.89);Muscle weakness (generalized) (M62.81);Pain Pain - Right/Left: Left Pain - part of body: Knee     Time: 1400-1452 PT Time Calculation (min) (ACUTE ONLY): 52 min  Charges:    $Gait Training: 23-37 mins $Therapeutic Activity: 8-22 mins PT General Charges $$ ACUTE PT VISIT: 1 Visit                     Kathlyn Sacramento, PT, DPT [facility]  Rehabilitation Services Physical Therapist Office: [phone] Website: [email or web address]    Berton Mount 02/03/2023, 3:16 PM

## 2023-02-04 ENCOUNTER — Other Ambulatory Visit: Payer: Self-pay | Admitting: Family Medicine

## 2023-02-04 DIAGNOSIS — M1712 Unilateral primary osteoarthritis, left knee: Secondary | ICD-10-CM | POA: Diagnosis not present

## 2023-02-04 NOTE — Progress Notes (Signed)
Physical Therapy Treatment Patient Details Name: Keith Watkins MRN: [id number] DOB: Jan 20, 1945 Today's Date: 02/04/2023   History of Present Illness 78 y.o. male presents to [facility] hospital on 02/01/2023 for elective L TKA. PMH includes BPH, lumbar spinal stenosis, PVD.    PT Comments  Remains adequate for d/c from a mobility standpoint, ready to start HHPT as soon as possible. Pt ambulated 2 bouts this morning 60 + 55 feet with RW at a supervision level, one seated rest break between sets, no physical assist required, gait pattern improving in symmetry. Supervision for transfers with RW, slow and effortful but with good control. Reinforced need to continue efforts with early ROM, knee flexion low load-long duration stretch as tolerated, and resting with full knee extension which looks great at the moment. Family feels better about vitals today; after therapy session BP 104/67 MAP 78, SpO2 94% on RA. All questions answered, he feels that he is ready to go home. Does not want SNF, I agree this is not necessary.    If plan is discharge home, recommend the following: A little help with walking and/or transfers;A little help with bathing/dressing/bathroom;Assistance with cooking/housework;Assist for transportation;Help with stairs or ramp for entrance   Can travel by private vehicle        Equipment Recommendations  Rolling walker (2 wheels)    Recommendations for Other Services       Precautions / Restrictions Precautions Precautions: Fall Restrictions Weight Bearing Restrictions Per Provider Order: Yes LLE Weight Bearing Per Provider Order: Weight bearing as tolerated     Mobility  Bed Mobility Overal bed mobility: Needs Assistance Bed Mobility: Supine to Sit     Supine to sit: Supervision, HOB elevated     General bed mobility comments: Supervision for safety, no physical assist but provided sheet to utilize bringing LLE to EOB. Was unable during attempt without sheet.     Transfers Overall transfer level: Needs assistance Equipment used: Rolling walker (2 wheels) Transfers: Sit to/from Stand Sit to Stand: Supervision, From elevated surface           General transfer comment: Supervision for safety, better recall of set-up and weight shift. Still limited Lt knee flexion to use to his advantage but reviewed importance of continued knee flexion efforts early in rehab recovery. Performed from bed and recliner x2.    Ambulation/Gait Ambulation/Gait assistance: Supervision Gait Distance (Feet): 60 Feet (+55) Assistive device: Rolling walker (2 wheels) Gait Pattern/deviations: Step-to pattern, Decreased stance time - left, Decreased stride length, Knee flexed in stance - left, Decreased weight shift to left, Antalgic Gait velocity: Decreased Gait velocity interpretation: 1.31 - 2.62 ft/sec, indicative of limited community ambulator   General Gait Details: Still shows good pace and control without overt buckling. Still with heavy use of RW for support. Starting to approach a step through pattern. Educated on symmetry. No LOB. Fatigued at 60 feet. Able to perform additional bout after seated rest break. Once returned to room BP 104/67 MAP 78, SpO2 94% on RA. Mild dyspnea. Mild dizziness towards end of distance with first bout only, recovered quickly with seated rest.   Stairs             Wheelchair Mobility     Tilt Bed    Modified Rankin (Stroke Patients Only)       Balance Overall balance assessment: Needs assistance Sitting-balance support: No upper extremity supported, Feet supported Sitting balance-Leahy Scale: Good     Standing balance support: Reliant on assistive device for  balance, Bilateral upper extremity supported Standing balance-Leahy Scale: Poor Standing balance comment: RW for support due to knee pain                            Cognition Arousal: Alert Behavior During Therapy: WFL for tasks  assessed/performed Overall Cognitive Status: Within Functional Limits for tasks assessed                                          Exercises Total Joint Exercises Ankle Circles/Pumps: AROM, Both, Supine, 15 reps Quad Sets: AROM, Supine, Strengthening, Both, 15 reps (8reps) Knee Flexion: AROM, Left, 5 reps, Seated    General Comments General comments (skin integrity, edema, etc.): Reviewed precautions with pt and family. verb understanding.      Pertinent Vitals/Pain Pain Assessment Pain Assessment: Faces Faces Pain Scale: Hurts little more Pain Location: Left behind Knee Pain Descriptors / Indicators: Aching, Guarding Pain Intervention(s): Monitored during session, Repositioned, Ice applied    Home Living                          Prior Function            PT Goals (current goals can now be found in the care plan section) Acute Rehab PT Goals Patient Stated Goal: Go home, return to work when better. PT Goal Formulation: With patient Time For Goal Achievement: 02/16/23 Potential to Achieve Goals: Good Progress towards PT goals: Progressing toward goals    Frequency    7X/week      PT Plan      Co-evaluation              AM-PAC PT "6 Clicks" Mobility   Outcome Measure  Help needed turning from your back to your side while in a flat bed without using bedrails?: None Help needed moving from lying on your back to sitting on the side of a flat bed without using bedrails?: A Little Help needed moving to and from a bed to a chair (including a wheelchair)?: A Little Help needed standing up from a chair using your arms (e.g., wheelchair or bedside chair)?: A Little Help needed to walk in hospital room?: A Little Help needed climbing 3-5 steps with a railing? : A Little 6 Click Score: 19    End of Session Equipment Utilized During Treatment: Gait belt Activity Tolerance: Patient tolerated treatment well Patient left: with call  bell/phone within reach;in chair;with chair alarm set;with family/visitor present Nurse Communication: Mobility status PT Visit Diagnosis: Other abnormalities of gait and mobility (R26.89);Muscle weakness (generalized) (M62.81);Pain Pain - Right/Left: Left Pain - part of body: Knee     Time: 1008-1040 PT Time Calculation (min) (ACUTE ONLY): 32 min  Charges:    $Gait Training: 8-22 mins $Therapeutic Activity: 8-22 mins PT General Charges $$ ACUTE PT VISIT: 1 Visit                     Kathlyn Sacramento, PT, DPT [facility]  Rehabilitation Services Physical Therapist Office: [phone] Website: [email or web address]    Berton Mount 02/04/2023, 10:54 AM

## 2023-02-04 NOTE — Progress Notes (Signed)
Reviewed AVS, patient expressed understanding of medications, MD follow up reviewed.  IV removed by RN, Site clean, dry and intact.  Pt entrance A where family member was waiting in vehicle to transport home.

## 2023-02-04 NOTE — Progress Notes (Signed)
Patient ID: Keith Watkins, male   DOB: 1945/07/23, 78 y.o.   MRN: [id number] The patient is awake and alert this morning.  His vitals are normal.  His left operative knee is stable.  Will attempt to get him discharged to home again today.  Still plan on having physical therapy work with him.  If he does not make any progress, short-term skilled nursing may need to be an option.  Hopefully he will be able to get discharged to home.

## 2023-02-04 NOTE — Discharge Summary (Signed)
Patient ID: KAITO SCHULENBURG MRN: [id number] DOB/AGE: 1945/02/20 78 y.o.  Admit date: 02/01/2023 Discharge date: 02/04/2023  Admission Diagnoses:  Principal Problem:   Primary osteoarthritis of left knee Active Problems:   Status post total left knee replacement   Discharge Diagnoses:  Same  Past Medical History:  Diagnosis Date   Arthritis    Back pain    Cataract    Cellulitis 07/31/2011   Complication of anesthesia    Pt wakes up too quickly   Enlarged prostate    Foot abscess, left 08/07/2011   Hypertension    prior to gastric bypass- no meds taken   Lumbar spinal stenosis    Obesity 08/09/2011    Surgeries: Procedure(s): LEFT TOTAL KNEE ARTHROPLASTY on 02/01/2023   Consultants:   Discharged Condition: Improved  Hospital Course: TAO SATZ is an 78 y.o. male who was admitted 02/01/2023 for operative treatment ofPrimary osteoarthritis of left knee. Patient has severe unremitting pain that affects sleep, daily activities, and work/hobbies. After pre-op clearance the patient was taken to the operating room on 02/01/2023 and underwent  Procedure(s): LEFT TOTAL KNEE ARTHROPLASTY.    Patient was given perioperative antibiotics:  Anti-infectives (From admission, onward)    Start     Dose/Rate Route Frequency Ordered Stop   02/01/23 2000  ceFAZolin (ANCEF) IVPB 2g/100 mL premix        2 g 200 mL/hr over 30 Minutes Intravenous Every 6 hours 02/01/23 1617 02/02/23 0859   02/01/23 1100  ceFAZolin (ANCEF) IVPB 3g/150 mL premix        3 g 300 mL/hr over 30 Minutes Intravenous On call to O.R. 02/01/23 1002 02/01/23 1241        Patient was given sequential compression devices, early ambulation, and chemoprophylaxis to prevent DVT.  Patient benefited maximally from hospital stay and there were no complications.    Recent vital signs: Patient Vitals for the past 24 hrs:  BP Temp Temp src Pulse Resp SpO2  02/04/23 1155 122/71 98.5 F (36.9 C) Oral 98 17 95 %  02/04/23 0718  (!) 110/58 98.7 F (37.1 C) Oral 97 19 (!) 89 %  02/04/23 0400 105/68 98.7 F (37.1 C) Oral 98 18 97 %  02/03/23 1941 (!) 106/48 99.5 F (37.5 C) Oral 96 18 99 %  02/03/23 1618 109/61 98.6 F (37 C) Oral (!) 103 18 97 %  02/03/23 1400 (!) 103/55 97.8 F (36.6 C) Oral 100 16 92 %     Recent laboratory studies:  Recent Labs    02/02/23 0903 02/03/23 0611  WBC 10.2 9.4  HGB 12.9* 10.9*  HCT 38.9* 33.3*  PLT 209 157  NA 138  --   K 4.2  --   CL 102  --   CO2 26  --   BUN 19  --   CREATININE 0.97  --   GLUCOSE 133*  --   CALCIUM 8.9  --      Discharge Medications:   Allergies as of 02/04/2023   No Known Allergies      Medication List     TAKE these medications    aspirin 81 MG chewable tablet Chew 1 tablet (81 mg total) by mouth 2 (two) times daily.   chlorhexidine 4 % external liquid Commonly known as: HIBICLENS Apply 15 mLs (1 Application total) topically as directed for 30 doses. Use as directed daily for 5 days every other week for 6 weeks.   Flintstones Complete Chew Chew 1 tablet by  mouth daily.   gabapentin 100 MG capsule Commonly known as: NEURONTIN Take 1 capsule (100 mg total) by mouth 3 (three) times daily as needed (for burning pain).   methocarbamol 500 MG tablet Commonly known as: ROBAXIN Take 1 tablet (500 mg total) by mouth every 6 (six) hours as needed for muscle spasms.   mupirocin ointment 2 % Commonly known as: BACTROBAN Place 1 Application into the nose 2 (two) times daily for 60 doses. Use as directed 2 times daily for 5 days every other week for 6 weeks.   oxybutynin 10 MG 24 hr tablet Commonly known as: DITROPAN-XL TAKE 1 TABLET (10 MG TOTAL) BY MOUTH IN THE MORNING AND AT BEDTIME   oxyCODONE 5 MG immediate release tablet Commonly known as: Oxy IR/ROXICODONE Take 1-2 tablets (5-10 mg total) by mouth every 6 (six) hours as needed for moderate pain (pain score 4-6) (pain score 4-6).   tamsulosin 0.4 MG Caps capsule Commonly  known as: FLOMAX Take 1 capsule (0.4 mg total) by mouth daily. What changed: when to take this               Durable Medical Equipment  (From admission, onward)           Start     Ordered   02/02/23 1123  DME 3 n 1  Once       Comments: Wide BSC due to body habitus   02/02/23 1123   02/02/23 1030  DME Walker rolling  Once       Comments: Wide walker  Question Answer Comment  Walker: With 5 Inch Wheels   Patient needs a walker to treat with the following condition Status post total left knee replacement      02/02/23 1029            Diagnostic Studies: DG Knee Left Port Result Date: 02/01/2023 CLINICAL DATA:  Status post left knee replacement. EXAM: PORTABLE LEFT KNEE - 1-2 VIEW COMPARISON:  None Available. FINDINGS: Left knee arthroplasty in expected alignment. No periprosthetic lucency or fracture. There has been patellar resurfacing. Recent postsurgical change includes air and edema in the soft tissues and joint space. Anterior skin staples. IMPRESSION: Left knee arthroplasty without immediate postoperative complication. Electronically Signed   By: Narda Rutherford M.D.   On: 02/01/2023 16:19    Disposition: Discharge disposition: 01-Home or Self Care          Follow-up Information     Kathryne Hitch, MD. Go on 02/14/2023.   Specialty: Orthopedic Surgery Why: at 4:15 pm for your first in office scheduled post-op appointment with Dr. Kendrick Ranch information: [address] [place] [address] [phone]         [facility] Follow up.   Specialty: Home Health Services Why: Someone from [facility] will be in contact with you to schedule your first in home physical therapy appointment Contact information: [address] [place] [address] [phone]                  Signed: Kathryne Hitch 02/04/2023, 1:07 PM

## 2023-02-07 ENCOUNTER — Other Ambulatory Visit: Payer: Self-pay | Admitting: *Deleted

## 2023-02-07 DIAGNOSIS — Z471 Aftercare following joint replacement surgery: Secondary | ICD-10-CM | POA: Diagnosis not present

## 2023-02-07 DIAGNOSIS — H9193 Unspecified hearing loss, bilateral: Secondary | ICD-10-CM | POA: Diagnosis not present

## 2023-02-07 DIAGNOSIS — Z96652 Presence of left artificial knee joint: Secondary | ICD-10-CM | POA: Diagnosis not present

## 2023-02-07 DIAGNOSIS — M48061 Spinal stenosis, lumbar region without neurogenic claudication: Secondary | ICD-10-CM | POA: Diagnosis not present

## 2023-02-07 DIAGNOSIS — R351 Nocturia: Secondary | ICD-10-CM | POA: Diagnosis not present

## 2023-02-07 DIAGNOSIS — N3941 Urge incontinence: Secondary | ICD-10-CM | POA: Diagnosis not present

## 2023-02-07 DIAGNOSIS — M1712 Unilateral primary osteoarthritis, left knee: Secondary | ICD-10-CM

## 2023-02-07 DIAGNOSIS — Z6838 Body mass index (BMI) 38.0-38.9, adult: Secondary | ICD-10-CM | POA: Diagnosis not present

## 2023-02-07 DIAGNOSIS — Z9049 Acquired absence of other specified parts of digestive tract: Secondary | ICD-10-CM | POA: Diagnosis not present

## 2023-02-07 DIAGNOSIS — N401 Enlarged prostate with lower urinary tract symptoms: Secondary | ICD-10-CM | POA: Diagnosis not present

## 2023-02-07 DIAGNOSIS — Z7982 Long term (current) use of aspirin: Secondary | ICD-10-CM | POA: Diagnosis not present

## 2023-02-07 DIAGNOSIS — Z9181 History of falling: Secondary | ICD-10-CM | POA: Diagnosis not present

## 2023-02-07 DIAGNOSIS — I10 Essential (primary) hypertension: Secondary | ICD-10-CM | POA: Diagnosis not present

## 2023-02-07 DIAGNOSIS — K219 Gastro-esophageal reflux disease without esophagitis: Secondary | ICD-10-CM | POA: Diagnosis not present

## 2023-02-09 DIAGNOSIS — Z471 Aftercare following joint replacement surgery: Secondary | ICD-10-CM | POA: Diagnosis not present

## 2023-02-09 DIAGNOSIS — N401 Enlarged prostate with lower urinary tract symptoms: Secondary | ICD-10-CM | POA: Diagnosis not present

## 2023-02-09 DIAGNOSIS — Z9049 Acquired absence of other specified parts of digestive tract: Secondary | ICD-10-CM | POA: Diagnosis not present

## 2023-02-09 DIAGNOSIS — Z6838 Body mass index (BMI) 38.0-38.9, adult: Secondary | ICD-10-CM | POA: Diagnosis not present

## 2023-02-09 DIAGNOSIS — R351 Nocturia: Secondary | ICD-10-CM | POA: Diagnosis not present

## 2023-02-09 DIAGNOSIS — Z9181 History of falling: Secondary | ICD-10-CM | POA: Diagnosis not present

## 2023-02-09 DIAGNOSIS — M48061 Spinal stenosis, lumbar region without neurogenic claudication: Secondary | ICD-10-CM | POA: Diagnosis not present

## 2023-02-09 DIAGNOSIS — K219 Gastro-esophageal reflux disease without esophagitis: Secondary | ICD-10-CM | POA: Diagnosis not present

## 2023-02-09 DIAGNOSIS — I10 Essential (primary) hypertension: Secondary | ICD-10-CM | POA: Diagnosis not present

## 2023-02-09 DIAGNOSIS — Z7982 Long term (current) use of aspirin: Secondary | ICD-10-CM | POA: Diagnosis not present

## 2023-02-09 DIAGNOSIS — Z96652 Presence of left artificial knee joint: Secondary | ICD-10-CM | POA: Diagnosis not present

## 2023-02-09 DIAGNOSIS — N3941 Urge incontinence: Secondary | ICD-10-CM | POA: Diagnosis not present

## 2023-02-09 DIAGNOSIS — H9193 Unspecified hearing loss, bilateral: Secondary | ICD-10-CM | POA: Diagnosis not present

## 2023-02-10 DIAGNOSIS — N3941 Urge incontinence: Secondary | ICD-10-CM | POA: Diagnosis not present

## 2023-02-10 DIAGNOSIS — H9193 Unspecified hearing loss, bilateral: Secondary | ICD-10-CM | POA: Diagnosis not present

## 2023-02-10 DIAGNOSIS — Z471 Aftercare following joint replacement surgery: Secondary | ICD-10-CM | POA: Diagnosis not present

## 2023-02-10 DIAGNOSIS — Z6838 Body mass index (BMI) 38.0-38.9, adult: Secondary | ICD-10-CM | POA: Diagnosis not present

## 2023-02-10 DIAGNOSIS — K219 Gastro-esophageal reflux disease without esophagitis: Secondary | ICD-10-CM | POA: Diagnosis not present

## 2023-02-10 DIAGNOSIS — N401 Enlarged prostate with lower urinary tract symptoms: Secondary | ICD-10-CM | POA: Diagnosis not present

## 2023-02-10 DIAGNOSIS — M48061 Spinal stenosis, lumbar region without neurogenic claudication: Secondary | ICD-10-CM | POA: Diagnosis not present

## 2023-02-10 DIAGNOSIS — R351 Nocturia: Secondary | ICD-10-CM | POA: Diagnosis not present

## 2023-02-10 DIAGNOSIS — Z9049 Acquired absence of other specified parts of digestive tract: Secondary | ICD-10-CM | POA: Diagnosis not present

## 2023-02-10 DIAGNOSIS — Z7982 Long term (current) use of aspirin: Secondary | ICD-10-CM | POA: Diagnosis not present

## 2023-02-10 DIAGNOSIS — Z9181 History of falling: Secondary | ICD-10-CM | POA: Diagnosis not present

## 2023-02-10 DIAGNOSIS — I10 Essential (primary) hypertension: Secondary | ICD-10-CM | POA: Diagnosis not present

## 2023-02-10 DIAGNOSIS — Z96652 Presence of left artificial knee joint: Secondary | ICD-10-CM | POA: Diagnosis not present

## 2023-02-11 ENCOUNTER — Other Ambulatory Visit: Payer: Self-pay | Admitting: Orthopaedic Surgery

## 2023-02-11 DIAGNOSIS — K219 Gastro-esophageal reflux disease without esophagitis: Secondary | ICD-10-CM | POA: Diagnosis not present

## 2023-02-11 DIAGNOSIS — N3941 Urge incontinence: Secondary | ICD-10-CM | POA: Diagnosis not present

## 2023-02-11 DIAGNOSIS — Z6838 Body mass index (BMI) 38.0-38.9, adult: Secondary | ICD-10-CM | POA: Diagnosis not present

## 2023-02-11 DIAGNOSIS — Z471 Aftercare following joint replacement surgery: Secondary | ICD-10-CM | POA: Diagnosis not present

## 2023-02-11 DIAGNOSIS — Z9049 Acquired absence of other specified parts of digestive tract: Secondary | ICD-10-CM | POA: Diagnosis not present

## 2023-02-11 DIAGNOSIS — H9193 Unspecified hearing loss, bilateral: Secondary | ICD-10-CM | POA: Diagnosis not present

## 2023-02-11 DIAGNOSIS — N401 Enlarged prostate with lower urinary tract symptoms: Secondary | ICD-10-CM | POA: Diagnosis not present

## 2023-02-11 DIAGNOSIS — R351 Nocturia: Secondary | ICD-10-CM | POA: Diagnosis not present

## 2023-02-11 DIAGNOSIS — I10 Essential (primary) hypertension: Secondary | ICD-10-CM | POA: Diagnosis not present

## 2023-02-11 DIAGNOSIS — Z7982 Long term (current) use of aspirin: Secondary | ICD-10-CM | POA: Diagnosis not present

## 2023-02-11 DIAGNOSIS — Z9181 History of falling: Secondary | ICD-10-CM | POA: Diagnosis not present

## 2023-02-11 DIAGNOSIS — M48061 Spinal stenosis, lumbar region without neurogenic claudication: Secondary | ICD-10-CM | POA: Diagnosis not present

## 2023-02-11 DIAGNOSIS — Z96652 Presence of left artificial knee joint: Secondary | ICD-10-CM | POA: Diagnosis not present

## 2023-02-13 DIAGNOSIS — Z96652 Presence of left artificial knee joint: Secondary | ICD-10-CM | POA: Diagnosis not present

## 2023-02-13 DIAGNOSIS — Z471 Aftercare following joint replacement surgery: Secondary | ICD-10-CM | POA: Diagnosis not present

## 2023-02-13 DIAGNOSIS — Z9049 Acquired absence of other specified parts of digestive tract: Secondary | ICD-10-CM | POA: Diagnosis not present

## 2023-02-13 DIAGNOSIS — R351 Nocturia: Secondary | ICD-10-CM | POA: Diagnosis not present

## 2023-02-13 DIAGNOSIS — I10 Essential (primary) hypertension: Secondary | ICD-10-CM | POA: Diagnosis not present

## 2023-02-13 DIAGNOSIS — N401 Enlarged prostate with lower urinary tract symptoms: Secondary | ICD-10-CM | POA: Diagnosis not present

## 2023-02-13 DIAGNOSIS — M48061 Spinal stenosis, lumbar region without neurogenic claudication: Secondary | ICD-10-CM | POA: Diagnosis not present

## 2023-02-13 DIAGNOSIS — Z6838 Body mass index (BMI) 38.0-38.9, adult: Secondary | ICD-10-CM | POA: Diagnosis not present

## 2023-02-13 DIAGNOSIS — Z7982 Long term (current) use of aspirin: Secondary | ICD-10-CM | POA: Diagnosis not present

## 2023-02-13 DIAGNOSIS — H9193 Unspecified hearing loss, bilateral: Secondary | ICD-10-CM | POA: Diagnosis not present

## 2023-02-13 DIAGNOSIS — Z9181 History of falling: Secondary | ICD-10-CM | POA: Diagnosis not present

## 2023-02-13 DIAGNOSIS — N3941 Urge incontinence: Secondary | ICD-10-CM | POA: Diagnosis not present

## 2023-02-13 DIAGNOSIS — K219 Gastro-esophageal reflux disease without esophagitis: Secondary | ICD-10-CM | POA: Diagnosis not present

## 2023-02-14 ENCOUNTER — Ambulatory Visit (INDEPENDENT_AMBULATORY_CARE_PROVIDER_SITE_OTHER): Payer: Medicare HMO | Admitting: Orthopaedic Surgery

## 2023-02-14 ENCOUNTER — Encounter: Payer: Self-pay | Admitting: Orthopaedic Surgery

## 2023-02-14 DIAGNOSIS — Z96652 Presence of left artificial knee joint: Secondary | ICD-10-CM

## 2023-02-14 NOTE — Progress Notes (Signed)
 The patient is here today for his first postoperative visit status post a left total knee replacement.  He is making good progress.  Has been compliant with a baby aspirin  twice daily and wearing his compressive hose.  Examination of his left knee today shows the staples are removed and Steri-Strips applied.  His calf is soft.  He has good flexion and extension of his ankle.  His knee actually has full extension and I can flex him to about 80 degrees to just past that.  Overall do think he looks good.  Weather permitting he is post to start outpatient physical therapy here on Wednesday.  Obviously if there are weather issues that will need to be rescheduled but he knows to push himself hard.  If he needs more medications between now and when we see him back in 4 weeks he will let us  know.  He can stop his baby aspirin  twice daily but I think he should still wear the compressive hose.  In 4 weeks from now we will see what his range of motion is like but we do not need x-rays unless there are issues.

## 2023-02-16 ENCOUNTER — Encounter: Payer: Self-pay | Admitting: Rehabilitative and Restorative Service Providers"

## 2023-02-16 ENCOUNTER — Ambulatory Visit: Payer: Medicare HMO | Admitting: Rehabilitative and Restorative Service Providers"

## 2023-02-16 DIAGNOSIS — G8929 Other chronic pain: Secondary | ICD-10-CM

## 2023-02-16 DIAGNOSIS — M25662 Stiffness of left knee, not elsewhere classified: Secondary | ICD-10-CM | POA: Diagnosis not present

## 2023-02-16 DIAGNOSIS — R262 Difficulty in walking, not elsewhere classified: Secondary | ICD-10-CM | POA: Diagnosis not present

## 2023-02-16 DIAGNOSIS — M79605 Pain in left leg: Secondary | ICD-10-CM

## 2023-02-16 DIAGNOSIS — R600 Localized edema: Secondary | ICD-10-CM

## 2023-02-16 DIAGNOSIS — M25562 Pain in left knee: Secondary | ICD-10-CM

## 2023-02-16 DIAGNOSIS — M6281 Muscle weakness (generalized): Secondary | ICD-10-CM | POA: Diagnosis not present

## 2023-02-16 NOTE — Therapy (Incomplete)
OUTPATIENT PHYSICAL THERAPY LOWER EXTREMITY EVALUATION   Patient Name: TATE ZAGAL MRN: [id number] DOB:1945-06-13, 78 y.o., male Today's Date: 02/17/2023  END OF SESSION:  PT End of Session - 02/16/23 1234     Visit Number 1    Number of Visits 20    Date for PT Re-Evaluation 04/27/23    Authorization Type [employer]    Authorization - Number of Visits 20    Progress Note Due on Visit 10    PT Start Time 1145    PT Stop Time 1231    PT Time Calculation (min) 46 min    Activity Tolerance Patient tolerated treatment well;No increased pain    Behavior During Therapy [facility] for tasks assessed/performed             Past Medical History:  Diagnosis Date   Arthritis    Back pain    Cataract    Cellulitis 07/31/2011   Complication of anesthesia    Pt wakes up too quickly   Enlarged prostate    Foot abscess, left 08/07/2011   Hypertension    prior to gastric bypass- no meds taken   Lumbar spinal stenosis    Obesity 08/09/2011   Past Surgical History:  Procedure Laterality Date   APPENDECTOMY  1963   CHOLECYSTECTOMY  1984   GASTRIC FUNDOPLICATION  1982   at [facility]   I & D EXTREMITY  08/07/2011   Procedure: IRRIGATION AND DEBRIDEMENT EXTREMITY;  Surgeon: Toni Arthurs, MD;  Location: [facility] ORS;  Service: Orthopedics;  Laterality: Left;  irrigation and debridement left foot abscess   KNEE ARTHROSCOPY Right    TOTAL KNEE ARTHROPLASTY Left 02/01/2023   Procedure: LEFT TOTAL KNEE ARTHROPLASTY;  Surgeon: Kathryne Hitch, MD;  Location: [facility] OR;  Service: Orthopedics;  Laterality: Left;   Patient Active Problem List   Diagnosis Date Noted   Status post total left knee replacement 02/01/2023   Bilateral lower extremity edema 06/21/2018   Cellulitis of left lower leg 08/21/2016   Eosinophilia 03/18/2014   Lumbar spinal stenosis    Benign prostatic hyperplasia (BPH) with urinary urge incontinence 08/09/2011    PCP: Donita Brooks, MD   REFERRING  PROVIDER: Kathryne Hitch   REFERRING DIAG:  W09.811 Status post total left knee replacement  - Primary  M17.12 Primary osteoarthritis of left knee   THERAPY DIAG:  Chronic pain of left knee  Muscle weakness (generalized)  Stiffness of left knee, not elsewhere classified  Difficulty in walking, not elsewhere classified  Localized edema  Rationale for Evaluation and Treatment: Rehabilitation  ONSET DATE: L TKA 02/01/2023  SUBJECTIVE:   SUBJECTIVE STATEMENT: Arrives ambulating with RW. Knee high ted hose donned. Had L TKA and is having pain in the knee however does not classify it as pain stating that he is very used to the pain so his scoring may be skewed.   Family (adult daughter and wife) present at session.    PERTINENT HISTORY: L TKA, Lumbar stenosis, History of L leg cellulitis, L foot wound (healed)   PAIN:  NPRS scale: reports a 0/10 since per the patient he is used to being in pain Pain location: around the L knee and some quad Pain description: tightness and throbbing Aggravating factors: bending the knee  Relieving factors: medicine- tylenol, ice   PRECAUTIONS: None  WEIGHT BEARING RESTRICTIONS: No  FALLS:  Has patient fallen in last 6 months? No  LIVING ENVIRONMENT: Lives with: lives with their family and  currently living at daughters since she is able to be with him 24/7 Lives in: House/apartment, daughters house has stairs with a rail and his home has a metal ramp Stairs: Yes: External: 4 steps; on right going up Has following equipment at home: Single point cane, Walker - 2 wheeled, Environmental consultant - 4 wheeled, and Ramped entry  OCCUPATION: retired  PLOF: Independent, ambulating with cane  PATIENT GOALS:  Reduce pain, walk better.    Next MD visit: 03/09/2023 Dr. Magnus Ivan, MD   OBJECTIVE:   DIAGNOSTIC FINDINGS: FINDINGS: Left knee arthroplasty in expected alignment. No periprosthetic lucency or fracture. There has been patellar  resurfacing. Recent postsurgical change includes air and edema in the soft tissues and joint space. Anterior skin staples.   IMPRESSION: Left knee arthroplasty without immediate postoperative complication.    PATIENT SURVEYS:  Patient-specific activity scoring scheme   "0" represents "unable to perform." "10" represents "able to perform at prior level. 0 1 2 3 4 5 6 7 8 9  10 (Date and Score) Activity Initial  Activity 02/16/2022    Stand for long time 6     2. Walk distances 0     3. Climb stairs 0   4.    5.     Total score = sum of the activity scores/number of activities Minimum detectable change (90%CI) for average score = 2 points Minimum detectable change (90%CI) for single activity score = 3 points  Score: average 2/10   COGNITION: Overall cognitive status: WFL    SENSATION: Not tested  EDEMA:  Circumferential: 27.25in above knee 19.25 below knee  MUSCLE LENGTH: Not tested  POSTURE:  No Significant postural limitations  PALPATION: Not tender to touch at calf or knee.   LOWER EXTREMITY ROM:   ROM Right eval Left eval  Hip flexion    Hip extension    Hip abduction    Hip adduction    Hip internal rotation    Hip external rotation    Knee flexion  A:82 P: 87  Knee extension  A: 2 P: 0  Ankle dorsiflexion    Ankle plantarflexion    Ankle inversion    Ankle eversion     (Blank rows = not tested)  LOWER EXTREMITY MMT:  MMT Right eval Left eval  Hip flexion    Hip extension    Hip abduction    Hip adduction    Hip internal rotation    Hip external rotation    Knee flexion    Knee extension  3/5 observed due to able to complete full range against gravity  Ankle dorsiflexion    Ankle plantarflexion    Ankle inversion    Ankle eversion     (Blank rows = not tested)   FUNCTIONAL TESTS:  Not tested  GAIT: Distance walked: clinic distances Assistive device utilized: Environmental consultant - 2 wheeled Level of assistance: Modified  independence Comments: decreased step length, decreased stance time on L, decreased cadence  TODAY'S TREATMENT                                                                          DATE: 02/17/2023 Theract: Bed mobility- instructed on log roll to side with contralateral reach to side of bed to fully reach sidelying before pushing through elbow and other arm to reach seated position as legs swing down. Patient walked through step by step reaching seated position stating that this was done with more ease than previous trials at home. Verbalized and demonstrated understanding. Required increased time.  Therex:    HEP instruction/performance c cues for techniques, handout provided.  Trial set performed of each for comprehension and symptom assessment.  See below for exercise list  PATIENT EDUCATION:  Education details: HEP, POC Person educated: Patient Education method: Explanation, Demonstration, Verbal cues, and Handouts Education comprehension: verbalized understanding, returned demonstration, and verbal cues required  HOME EXERCISE PROGRAM: Access Code: [email or web address] URL: [email or web address] Date: 02/17/2023 Prepared by: Chyrel Masson   Exercises - Supine Heel Slide (Mirrored)  - 3-5 x daily - 7 x weekly - 1 sets - 10 reps - 5 hold - Supine Heel Slide with Strap  - 3-5 x daily - 7 x weekly - 1 sets - 10 reps - 5 hold - Seated Long Arc Quad (Mirrored)  - 3-5 x daily - 7 x weekly - 1 sets - 5-10 reps - 2 hold - Supine Knee Extension Mobilization with Weight (Mirrored)  - 4-5 x daily - 7 x weekly - 1 sets - 1 reps - to tolerance up to 15 mins hold - Seated Quad Set (Mirrored)  - 3-5 x daily - 7 x weekly - 1 sets - 10 reps - 5 hold  ASSESSMENT:  CLINICAL IMPRESSION: Patient is a 78 y.o. who comes to clinic  with complaints of Lt knee pain s/p Lt TKA with mobility, strength and movement coordination deficits that impair their ability to perform usual daily and recreational functional activities without increase difficulty/symptoms at this time.  Patient to benefit from skilled PT services to address impairments and limitations to improve to previous level of function without restriction secondary to condition.   OBJECTIVE IMPAIRMENTS: Abnormal gait, decreased activity tolerance, decreased balance, decreased coordination, decreased endurance, decreased knowledge of use of DME, decreased mobility, difficulty walking, decreased ROM, decreased strength, increased edema, obesity, and pain.   ACTIVITY LIMITATIONS: carrying, lifting, bending, sitting, standing, squatting, stairs, bed mobility, and locomotion level  PARTICIPATION LIMITATIONS: meal prep, cleaning, laundry, interpersonal relationship, driving, shopping, community activity, occupation, and yard work  PERSONAL FACTORS: Age, Fitness, Time since onset of injury/illness/exacerbation, and 3+ comorbidities: PMH includes BPH, lumbar spinal stenosis, PVD, HTN  are also affecting patient's functional outcome.   REHAB POTENTIAL: Good  CLINICAL DECISION MAKING: Evolving/moderate complexity  EVALUATION COMPLEXITY: Moderate   GOALS: Goals reviewed with patient? Yes  SHORT TERM GOALS: (target date for Short term goals are 3 weeks 03/09/2023)   1.  Patient will demonstrate independent use of home exercise program to maintain progress from in clinic treatments.  Goal status: New  LONG TERM GOALS: (target dates for all long term goals are 10 weeks  04/27/2023 )   1. Patient will demonstrate/report pain at worst less  than or equal to 2/10 to facilitate minimal limitation in daily activity secondary to pain symptoms.  Goal status: New   2. Patient will demonstrate independent use of home exercise program to facilitate ability to maintain/progress  functional gains from skilled physical therapy services.  Goal status: New  3. Patient will demonstrate/report PSFS scoring at average > or = 8/10 scoring for improved functional activity performance.  Goal status: New   4.  Patient will demonstrate L LE MMT 5/5 throughout to faciltiate usual transfers, stairs, squatting at [facility] for daily life.   Goal status: New   5.  Patient will demonstrate or report ability to perform 18in chair transfer without UE assist. Goal status: New   6.  Patient will ambulate community distances with LRAD independently to decrease caregiver burden Goal status: New   7.  Patient will demonstrate 0-115 deg knee AROM to improve mobility and functional tasks. Goal Status: New  8. Patient will demonstrate ascending/descending stairs reciprocally s UE assist for community integration.   Goal status : new    PLAN:  PT FREQUENCY: 1-2x/week  PT DURATION: 10 weeks  PLANNED INTERVENTIONS: Can include 82956- PT Re-evaluation, 97110-Therapeutic exercises, 97530- Therapeutic activity, [id number]- Neuromuscular re-education, 97535- Self Care, 97140- Manual therapy, [id number]- Gait training, [phone]- Orthotic Fit/training, [phone]- Canalith repositioning, [id number]- Aquatic Therapy, 97014- Electrical stimulation (unattended), [id number]- Electrical stimulation (manual), [id number] Physical performance testing, 97016- Vasopneumatic device, [id number]- Ultrasound, [id number]- Traction (mechanical), [id number]- Ionotophoresis 4mg /ml Dexamethasone, Patient/Family education, Balance training, Stair training, Taping, Dry Needling, Joint mobilization, Joint manipulation, Spinal manipulation, Scar mobilization, Vestibular training, Visual/preceptual remediation/compensation, DME instructions, Cryotherapy, and Moist heat.  All performed as medically necessary.  All included unless contraindicated  PLAN FOR NEXT SESSION: Review HEP knowledge/results.  Progressive flexion mobility gains.     Akeia Perot, Friedrich Harriott,  Student-PT 02/16/2023, 4:59 PM

## 2023-02-17 ENCOUNTER — Encounter: Payer: Self-pay | Admitting: Rehabilitative and Restorative Service Providers"

## 2023-02-17 ENCOUNTER — Ambulatory Visit: Payer: Medicare HMO | Admitting: Rehabilitative and Restorative Service Providers"

## 2023-02-17 DIAGNOSIS — M25562 Pain in left knee: Secondary | ICD-10-CM | POA: Diagnosis not present

## 2023-02-17 DIAGNOSIS — M6281 Muscle weakness (generalized): Secondary | ICD-10-CM

## 2023-02-17 DIAGNOSIS — G8929 Other chronic pain: Secondary | ICD-10-CM | POA: Diagnosis not present

## 2023-02-17 DIAGNOSIS — R600 Localized edema: Secondary | ICD-10-CM

## 2023-02-17 DIAGNOSIS — M25662 Stiffness of left knee, not elsewhere classified: Secondary | ICD-10-CM | POA: Diagnosis not present

## 2023-02-17 DIAGNOSIS — R262 Difficulty in walking, not elsewhere classified: Secondary | ICD-10-CM

## 2023-02-17 NOTE — Therapy (Signed)
OUTPATIENT PHYSICAL THERAPY TREATMENT   Patient Name: Keith Watkins MRN: [id number] DOB:17-May-1945, 78 y.o., male Today's Date: 02/17/2023  END OF SESSION:  PT End of Session - 02/17/23 0914     Visit Number 2    Number of Visits 20    Date for PT Re-Evaluation 04/27/23    Authorization Type [employer] $10 copay    Progress Note Due on Visit 10    PT Start Time 0930    PT Stop Time 1019    PT Time Calculation (min) 49 min    Activity Tolerance Patient tolerated treatment well    Behavior During Therapy [facility] for tasks assessed/performed              Past Medical History:  Diagnosis Date   Arthritis    Back pain    Cataract    Cellulitis 07/31/2011   Complication of anesthesia    Pt wakes up too quickly   Enlarged prostate    Foot abscess, left 08/07/2011   Hypertension    prior to gastric bypass- no meds taken   Lumbar spinal stenosis    Obesity 08/09/2011   Past Surgical History:  Procedure Laterality Date   APPENDECTOMY  1963   CHOLECYSTECTOMY  1984   GASTRIC FUNDOPLICATION  1982   at [facility]   I & D EXTREMITY  08/07/2011   Procedure: IRRIGATION AND DEBRIDEMENT EXTREMITY;  Surgeon: Toni Arthurs, MD;  Location: [facility];  Service: Orthopedics;  Laterality: Left;  irrigation and debridement left foot abscess   KNEE ARTHROSCOPY Right    TOTAL KNEE ARTHROPLASTY Left 02/01/2023   Procedure: LEFT TOTAL KNEE ARTHROPLASTY;  Surgeon: Kathryne Hitch, MD;  Location: [facility] OR;  Service: Orthopedics;  Laterality: Left;   Patient Active Problem List   Diagnosis Date Noted   Status post total left knee replacement 02/01/2023   Bilateral lower extremity edema 06/21/2018   Cellulitis of left lower leg 08/21/2016   Eosinophilia 03/18/2014   Lumbar spinal stenosis    Benign prostatic hyperplasia (BPH) with urinary urge incontinence 08/09/2011    PCP: Donita Brooks, MD   REFERRING PROVIDER: Kathryne Hitch   REFERRING DIAG:  Z56.387  Status post total left knee replacement  - Primary  M17.12 Primary osteoarthritis of left knee   THERAPY DIAG:  Chronic pain of left knee  Muscle weakness (generalized)  Stiffness of left knee, not elsewhere classified  Difficulty in walking, not elsewhere classified  Localized edema  Rationale for Evaluation and Treatment: Rehabilitation  ONSET DATE: Lt TKA 02/01/2023  SUBJECTIVE:   SUBJECTIVE STATEMENT: Pt indicated no specific pain at rest upon arrival today.  Pt indicated discomfort with bending still noted.  Pt indicated having to continue to help move Lt leg.    PERTINENT HISTORY: Lt TKA, Lumbar stenosis, History of Lt leg cellulitis, Lt foot wound (healed)   PAIN:  NPRS scale: no specific number  Pain location: around the L knee and some quad Pain description: tightness and throbbing Aggravating factors: bending the knee  Relieving factors: medicine- tylenol, ice   PRECAUTIONS: None  WEIGHT BEARING RESTRICTIONS: No  FALLS:  Has patient fallen in last 6 months? No  LIVING ENVIRONMENT: Lives with: lives with their family and currently living at daughters since she is able to be with him 24/7 Lives in: House/apartment, daughters house has stairs with a rail and his home has a metal ramp Stairs: Yes: External: 4 steps; on right going up Has following equipment  at home: Single point cane, Walker - 2 wheeled, Environmental consultant - 4 wheeled, and Ramped entry  OCCUPATION: retired  PLOF: Independent, ambulating with cane  PATIENT GOALS:  Reduce pain, walk better.    Next MD visit: 03/09/2023 Dr. Magnus Ivan, MD   OBJECTIVE:   DIAGNOSTIC FINDINGS: FINDINGS: Left knee arthroplasty in expected alignment. No periprosthetic lucency or fracture. There has been patellar resurfacing. Recent postsurgical change includes air and edema in the soft tissues and joint space. Anterior skin staples.   IMPRESSION: Left knee arthroplasty without immediate postoperative complication.     PATIENT SURVEYS:  Patient-specific activity scoring scheme   "0" represents "unable to perform." "10" represents "able to perform at prior level. 0 1 2 3 4 5 6 7 8 9  10 (Date and Score) Activity Initial  Activity 02/16/2022    Stand for long time 6     2. Walk distances 0     3. Climb stairs 0   4.    5.     Total score = sum of the activity scores/number of activities Minimum detectable change (90%CI) for average score = 2 points Minimum detectable change (90%CI) for single activity score = 3 points  Score: average 2/10   COGNITION: 02/17/2023 Overall cognitive status: WFL    SENSATION: 02/17/2023 Not tested  EDEMA:  02/17/2023 Circumferential: 27.25in above knee 19.25 below knee  MUSCLE LENGTH: 02/17/2023 Not tested  POSTURE:  02/17/2023 No Significant postural limitations  PALPATION: 02/17/2023 Not tender to touch at calf or knee.   LOWER EXTREMITY ROM:   ROM Right 02/17/2023 Left 02/17/2023  Hip flexion    Hip extension    Hip abduction    Hip adduction    Hip internal rotation    Hip external rotation    Knee flexion  A:82 P: 87  Knee extension  A: 2 P: 0  Ankle dorsiflexion    Ankle plantarflexion    Ankle inversion    Ankle eversion     (Blank rows = not tested)  LOWER EXTREMITY MMT:  MMT Right 02/17/2023 Left 02/17/2023  Hip flexion    Hip extension    Hip abduction    Hip adduction    Hip internal rotation    Hip external rotation    Knee flexion    Knee extension  3/5 observed due to able to complete full range against gravity  Ankle dorsiflexion    Ankle plantarflexion    Ankle inversion    Ankle eversion     (Blank rows = not tested)  FUNCTIONAL TESTS:  02/17/2023 Not tested  GAIT: 02/17/2023 Distance walked: clinic distances Assistive device utilized: Environmental consultant - 2 wheeled Level of assistance: Modified independence Comments: decreased step length, decreased stance time on L, decreased cadence  TODAY'S TREATMENT                                                                          DATE: 02/18/2023 Therex: Nustep Lvl 5 9 mins UE/LE.  Incline gastroc stretch 30 sec x 3 bilateral Seated Lt leg LAQ 2.5 lbs with end range pauses 2 x 10 c contralateral leg movement opposite.  Education and performance of ankle pumps for edema control activity at home.  Suggested when elevated and icing.   Neuro Re-ed (to improve neuromuscular recruitment) Isometric 5 sec extension, 5 sec flexion x 12  Supine quad set Lt leg 5 sec hold x 10  Education on neural recruitment aspects of intervention.   Manual: Seated Lt knee flexion c mobilization c movement IR/distraction.   Vaso 10 mins Lt knee 34 deg medium compression in elevation supine.   TODAY'S TREATMENT                                                                          DATE: 02/17/2023 Theract: Bed mobility- instructed on log roll to side with contralateral reach to side of bed to fully reach sidelying before pushing through elbow and other arm to reach seated position as legs swing down. Patient walked through step by step reaching seated position stating that this was done with more ease than previous trials at home. Verbalized and demonstrated understanding. Required increased time.  Therex:    HEP instruction/performance c cues for techniques, handout provided.  Trial set performed of each for comprehension and symptom assessment.  See below for exercise list  PATIENT EDUCATION:  Education details: HEP, POC Person educated: Patient Education method: Explanation, Demonstration, Verbal cues, and Handouts Education comprehension: verbalized understanding, returned demonstration, and verbal cues required  HOME EXERCISE PROGRAM: Access Code: [email or web address] URL: [email or web address] Date:  02/17/2023 Prepared by: Chyrel Masson   Exercises - Supine Heel Slide (Mirrored)  - 3-5 x daily - 7 x weekly - 1 sets - 10 reps - 5 hold - Supine Heel Slide with Strap  - 3-5 x daily - 7 x weekly - 1 sets - 10 reps - 5 hold - Seated Long Arc Quad (Mirrored)  - 3-5 x daily - 7 x weekly - 1 sets - 5-10 reps - 2 hold - Supine Knee Extension Mobilization with Weight (Mirrored)  - 4-5 x daily - 7 x weekly - 1 sets - 1 reps - to tolerance up to 15 mins hold - Seated Quad Set (Mirrored)  - 3-5 x daily - 7 x weekly - 1 sets - 10 reps - 5 hold  ASSESSMENT:  CLINICAL IMPRESSION: Pt to continue to benefit from gains in flexion mobilitay, quad activation and strength.  Continued skilled PT services indicated at this time to continue progression towards goals.   OBJECTIVE IMPAIRMENTS: Abnormal gait, decreased activity tolerance, decreased balance, decreased coordination, decreased endurance, decreased knowledge of use of DME, decreased mobility, difficulty walking, decreased ROM, decreased strength,  increased edema, obesity, and pain.   ACTIVITY LIMITATIONS: carrying, lifting, bending, sitting, standing, squatting, stairs, bed mobility, and locomotion level  PARTICIPATION LIMITATIONS: meal prep, cleaning, laundry, interpersonal relationship, driving, shopping, community activity, occupation, and yard work  PERSONAL FACTORS: Age, Fitness, Time since onset of injury/illness/exacerbation, and 3+ comorbidities: PMH includes BPH, lumbar spinal stenosis, PVD, HTN  are also affecting patient's functional outcome.   REHAB POTENTIAL: Good  CLINICAL DECISION MAKING: Evolving/moderate complexity  EVALUATION COMPLEXITY: Moderate   GOALS: Goals reviewed with patient? Yes  SHORT TERM GOALS: (target date for Short term goals are 3 weeks 03/09/2023)   1.  Patient will demonstrate independent use of home exercise program to maintain progress from in clinic treatments.  Goal status: on going 02/17/2023  LONG  TERM GOALS: (target dates for all long term goals are 10 weeks  04/27/2023 )   1. Patient will demonstrate/report pain at worst less than or equal to 2/10 to facilitate minimal limitation in daily activity secondary to pain symptoms.  Goal status: New   2. Patient will demonstrate independent use of home exercise program to facilitate ability to maintain/progress functional gains from skilled physical therapy services.  Goal status: New  3. Patient will demonstrate/report PSFS scoring at average > or = 8/10 scoring for improved functional activity performance.  Goal status: New   4.  Patient will demonstrate L LE MMT 5/5 throughout to faciltiate usual transfers, stairs, squatting at [facility] for daily life.   Goal status: New   5.  Patient will demonstrate or report ability to perform 18in chair transfer without UE assist. Goal status: New   6.  Patient will ambulate community distances with LRAD independently to decrease caregiver burden Goal status: New   7.  Patient will demonstrate 0-115 deg knee AROM to improve mobility and functional tasks. Goal Status: New  8. Patient will demonstrate ascending/descending stairs reciprocally s UE assist for community integration.   Goal status : new    PLAN:  PT FREQUENCY: 1-2x/week  PT DURATION: 10 weeks  PLANNED INTERVENTIONS: Can include 82956- PT Re-evaluation, 97110-Therapeutic exercises, 97530- Therapeutic activity, [id number]- Neuromuscular re-education, 97535- Self Care, 97140- Manual therapy, [id number]- Gait training, [phone]- Orthotic Fit/training, [id number]- Canalith repositioning, [id number]- Aquatic Therapy, 97014- Electrical stimulation (unattended), [id number]- Electrical stimulation (manual), [id number] Physical performance testing, 97016- Vasopneumatic device, [id number]- Ultrasound, [id number]- Traction (mechanical), [id number]- Ionotophoresis 4mg /ml Dexamethasone, Patient/Family education, Balance training, Stair training, Taping, Dry Needling, Joint mobilization,  Joint manipulation, Spinal manipulation, Scar mobilization, Vestibular training, Visual/preceptual remediation/compensation, DME instructions, Cryotherapy, and Moist heat.  All performed as medically necessary.  All included unless contraindicated  PLAN FOR NEXT SESSION: Add early static balance, continue flexion mobility gains and quad activation/strengthening.    Chyrel Masson, PT, DPT, OCS, ATC 02/17/23  10:11 AM

## 2023-02-22 ENCOUNTER — Ambulatory Visit: Payer: Medicare HMO | Admitting: Rehabilitative and Restorative Service Providers"

## 2023-02-22 ENCOUNTER — Encounter: Payer: Self-pay | Admitting: Rehabilitative and Restorative Service Providers"

## 2023-02-22 DIAGNOSIS — M25662 Stiffness of left knee, not elsewhere classified: Secondary | ICD-10-CM

## 2023-02-22 DIAGNOSIS — M6281 Muscle weakness (generalized): Secondary | ICD-10-CM

## 2023-02-22 DIAGNOSIS — R600 Localized edema: Secondary | ICD-10-CM

## 2023-02-22 DIAGNOSIS — R262 Difficulty in walking, not elsewhere classified: Secondary | ICD-10-CM

## 2023-02-22 DIAGNOSIS — G8929 Other chronic pain: Secondary | ICD-10-CM | POA: Diagnosis not present

## 2023-02-22 DIAGNOSIS — M25562 Pain in left knee: Secondary | ICD-10-CM

## 2023-02-22 NOTE — Therapy (Signed)
 OUTPATIENT PHYSICAL THERAPY TREATMENT   Patient Name: Keith Watkins MRN: [id number] DOB:1945/08/05, 78 y.o., male Today's Date: 02/22/2023  END OF SESSION:  PT End of Session - 02/22/23 1135     Visit Number 3    Number of Visits 20    Date for PT Re-Evaluation 04/27/23    Authorization Type [employer] $10 copay    Authorization - Visit Number 3    Progress Note Due on Visit 10    PT Start Time 1137    PT Stop Time 1227    PT Time Calculation (min) 50 min    Activity Tolerance Patient tolerated treatment well    Behavior During Therapy [facility] for tasks assessed/performed               Past Medical History:  Diagnosis Date   Arthritis    Back pain    Cataract    Cellulitis 07/31/2011   Complication of anesthesia    Pt wakes up too quickly   Enlarged prostate    Foot abscess, left 08/07/2011   Hypertension    prior to gastric bypass- no meds taken   Lumbar spinal stenosis    Obesity 08/09/2011   Past Surgical History:  Procedure Laterality Date   APPENDECTOMY  1963   CHOLECYSTECTOMY  1984   GASTRIC FUNDOPLICATION  1982   at [facility]   I & D EXTREMITY  08/07/2011   Procedure: IRRIGATION AND DEBRIDEMENT EXTREMITY;  Surgeon: Toni Arthurs, MD;  Location: [facility];  Service: Orthopedics;  Laterality: Left;  irrigation and debridement left foot abscess   KNEE ARTHROSCOPY Right    TOTAL KNEE ARTHROPLASTY Left 02/01/2023   Procedure: LEFT TOTAL KNEE ARTHROPLASTY;  Surgeon: Kathryne Hitch, MD;  Location: [facility] OR;  Service: Orthopedics;  Laterality: Left;   Patient Active Problem List   Diagnosis Date Noted   Status post total left knee replacement 02/01/2023   Bilateral lower extremity edema 06/21/2018   Cellulitis of left lower leg 08/21/2016   Eosinophilia 03/18/2014   Lumbar spinal stenosis    Benign prostatic hyperplasia (BPH) with urinary urge incontinence 08/09/2011    PCP: Donita Brooks, MD   REFERRING PROVIDER: Kathryne Hitch   REFERRING DIAG:  W09.811 Status post total left knee replacement  - Primary  M17.12 Primary osteoarthritis of left knee   THERAPY DIAG:  Chronic pain of left knee  Muscle weakness (generalized)  Stiffness of left knee, not elsewhere classified  Difficulty in walking, not elsewhere classified  Localized edema  Rationale for Evaluation and Treatment: Rehabilitation  ONSET DATE: Lt TKA 02/01/2023  SUBJECTIVE:   SUBJECTIVE STATEMENT: Pt indicated feeling tightness in calf.  Reported no other symptoms (warm, tenderness to touch)   PERTINENT HISTORY: Lt TKA, Lumbar stenosis, History of Lt leg cellulitis, Lt foot wound (healed)   PAIN:  NPRS scale: no specific number  Pain location: around the L knee and some quad Pain description: tightness and throbbing Aggravating factors: bending the knee  Relieving factors: medicine- tylenol, ice   PRECAUTIONS: None  WEIGHT BEARING RESTRICTIONS: No  FALLS:  Has patient fallen in last 6 months? No  LIVING ENVIRONMENT: Lives with: lives with their family and currently living at daughters since she is able to be with him 24/7 Lives in: House/apartment, daughters house has stairs with a rail and his home has a metal ramp Stairs: Yes: External: 4 steps; on right going up Has following equipment at home: Single point cane, Walker -  2 wheeled, Walker - 4 wheeled, and Ramped entry  OCCUPATION: retired  PLOF: Independent, ambulating with cane  PATIENT GOALS:  Reduce pain, walk better.    Next MD visit: 03/09/2023 Dr. Magnus Ivan, MD   OBJECTIVE:   DIAGNOSTIC FINDINGS: FINDINGS: Left knee arthroplasty in expected alignment. No periprosthetic lucency or fracture. There has been patellar resurfacing. Recent postsurgical change includes air and edema in the soft tissues and joint space. Anterior skin staples.   IMPRESSION: Left knee arthroplasty without immediate postoperative complication.    PATIENT SURVEYS:   Patient-specific activity scoring scheme   "0" represents "unable to perform." "10" represents "able to perform at prior level. 0 1 2 3 4 5 6 7 8 9  10 (Date and Score) Activity Initial  Activity 02/16/2022    Stand for long time 6     2. Walk distances 0     3. Climb stairs 0   4.    5.     Total score = sum of the activity scores/number of activities Minimum detectable change (90%CI) for average score = 2 points Minimum detectable change (90%CI) for single activity score = 3 points  Score: average 2/10   COGNITION: 02/17/2023 Overall cognitive status: WFL    SENSATION: 02/17/2023 Not tested  EDEMA:  02/17/2023 Circumferential: 27.25in above knee 19.25 below knee  MUSCLE LENGTH: 02/17/2023 Not tested  POSTURE:  02/17/2023 No Significant postural limitations  PALPATION: 02/17/2023 Not tender to touch at calf or knee.   LOWER EXTREMITY ROM:   ROM Right 02/17/2023 Left 02/17/2023  Hip flexion    Hip extension    Hip abduction    Hip adduction    Hip internal rotation    Hip external rotation    Knee flexion  A:82 P: 87  Knee extension  A: 2 P: 0  Ankle dorsiflexion    Ankle plantarflexion    Ankle inversion    Ankle eversion     (Blank rows = not tested)  LOWER EXTREMITY MMT:  MMT Right 02/17/2023 Left 02/17/2023  Hip flexion    Hip extension    Hip abduction    Hip adduction    Hip internal rotation    Hip external rotation    Knee flexion    Knee extension  3/5 observed due to able to complete full range against gravity  Ankle dorsiflexion    Ankle plantarflexion    Ankle inversion    Ankle eversion     (Blank rows = not tested)  FUNCTIONAL TESTS:  02/17/2023 Not tested  GAIT: 02/17/2023 Distance walked: clinic distances Assistive device utilized: Environmental consultant - 2 wheeled Level of assistance: Modified independence Comments: decreased step length, decreased stance time on L, decreased cadence  TODAY'S TREATMENT                                                                          DATE: 02/22/2023 Therex: Nustep Lvl 5 10 mins UE/LE.  Incline gastroc stretch 30 sec x 5 bilateral Supine quad set 5 sec hold x 5   Gait Training [facility] education and use with CGA with cane on Rt hand.  Cues for sequencing.  Performed 100 ft distance, 60 ft distance.   TherActivity (to improve progressive mobility, stairs, squatting, transfers, stair and ramp navigation.) Leg press double leg 75 lbs x 15 , single leg Lt 31 lbs x 15 (skip tomorrow for recovery) SPC use on ramp and curb with CGA with sequencing placement of cane and using good leg up/surgery leg down 6 inch stpe  Manual: Seated Lt knee flexion c mobilization c movement IR/distraction.   Vaso 10 mins Lt knee 34 deg medium compression in elevation supine.   TODAY'S TREATMENT                                                                          DATE: 02/18/2023 Therex: Nustep Lvl 5 9 mins UE/LE.  Incline gastroc stretch 30 sec x 3 bilateral Seated Lt leg LAQ 2.5 lbs with end range pauses 2 x 10 c contralateral leg movement opposite.  Education and performance of ankle pumps for edema control activity at home.  Suggested when elevated and icing.   Neuro Re-ed (to improve neuromuscular recruitment) Isometric 5 sec extension, 5 sec flexion x 12  Supine quad set Lt leg 5 sec hold x 10  Education on neural recruitment aspects of intervention.   Manual: Seated Lt knee flexion c mobilization c movement IR/distraction.   Vaso 10 mins Lt knee 34 deg medium compression in elevation supine.   TODAY'S TREATMENT                                                                          DATE: 02/17/2023 Theract: Bed mobility- instructed on log roll to side with contralateral reach to side of bed to fully reach sidelying before  pushing through elbow and other arm to reach seated position as legs swing down. Patient walked through step by step reaching seated position stating that this was done with more ease than previous trials at home. Verbalized and demonstrated understanding. Required increased time.  Therex:    HEP instruction/performance c cues for techniques, handout provided.  Trial set performed of each for comprehension and symptom assessment.  See below for exercise list  PATIENT EDUCATION:  Education details: HEP, POC Person educated: Patient Education method: Explanation, Demonstration, Verbal cues, and Handouts Education comprehension: verbalized  understanding, returned demonstration, and verbal cues required  HOME EXERCISE PROGRAM: Access Code: [email or web address] URL: [email or web address] Date: 02/17/2023 Prepared by: Chyrel Masson   Exercises - Supine Heel Slide (Mirrored)  - 3-5 x daily - 7 x weekly - 1 sets - 10 reps - 5 hold - Supine Heel Slide with Strap  - 3-5 x daily - 7 x weekly - 1 sets - 10 reps - 5 hold - Seated Long Arc Quad (Mirrored)  - 3-5 x daily - 7 x weekly - 1 sets - 5-10 reps - 2 hold - Supine Knee Extension Mobilization with Weight (Mirrored)  - 4-5 x daily - 7 x weekly - 1 sets - 1 reps - to tolerance up to 15 mins hold - Seated Quad Set (Mirrored)  - 3-5 x daily - 7 x weekly - 1 sets - 10 reps - 5 hold  ASSESSMENT:  CLINICAL IMPRESSION: Pt denied any severe tenderness to touch in calf. (-) warmth testing in calf.  SPC use was close to supervision level at this time but not quite good enough for home use. Continued skilled PT services warranted at this time.   OBJECTIVE IMPAIRMENTS: Abnormal gait, decreased activity tolerance, decreased balance, decreased coordination, decreased endurance, decreased knowledge of use of DME, decreased mobility, difficulty walking, decreased ROM, decreased strength, increased edema, obesity, and pain.   ACTIVITY LIMITATIONS: carrying,  lifting, bending, sitting, standing, squatting, stairs, bed mobility, and locomotion level  PARTICIPATION LIMITATIONS: meal prep, cleaning, laundry, interpersonal relationship, driving, shopping, community activity, occupation, and yard work  PERSONAL FACTORS: Age, Fitness, Time since onset of injury/illness/exacerbation, and 3+ comorbidities: PMH includes BPH, lumbar spinal stenosis, PVD, HTN  are also affecting patient's functional outcome.   REHAB POTENTIAL: Good  CLINICAL DECISION MAKING: Evolving/moderate complexity  EVALUATION COMPLEXITY: Moderate   GOALS: Goals reviewed with patient? Yes  SHORT TERM GOALS: (target date for Short term goals are 3 weeks 03/09/2023)   1.  Patient will demonstrate independent use of home exercise program to maintain progress from in clinic treatments.  Goal status: on going 02/17/2023  LONG TERM GOALS: (target dates for all long term goals are 10 weeks  04/27/2023 )   1. Patient will demonstrate/report pain at worst less than or equal to 2/10 to facilitate minimal limitation in daily activity secondary to pain symptoms.  Goal status: New   2. Patient will demonstrate independent use of home exercise program to facilitate ability to maintain/progress functional gains from skilled physical therapy services.  Goal status: New  3. Patient will demonstrate/report PSFS scoring at average > or = 8/10 scoring for improved functional activity performance.  Goal status: New   4.  Patient will demonstrate L LE MMT 5/5 throughout to faciltiate usual transfers, stairs, squatting at [facility] for daily life.   Goal status: New   5.  Patient will demonstrate or report ability to perform 18in chair transfer without UE assist. Goal status: New   6.  Patient will ambulate community distances with LRAD independently to decrease caregiver burden Goal status: New   7.  Patient will demonstrate 0-115 deg knee AROM to improve mobility and functional tasks. Goal  Status: New  8. Patient will demonstrate ascending/descending stairs reciprocally s UE assist for community integration.   Goal status : new    PLAN:  PT FREQUENCY: 1-2x/week  PT DURATION: 10 weeks  PLANNED INTERVENTIONS: Can include 54098- PT Re-evaluation, 97110-Therapeutic exercises, 97530- Therapeutic activity, [id number]- Neuromuscular re-education, 97535- Self Care, 97140- Manual therapy, [id number]- Gait training,  16109- Orthotic Fit/training, 60454- Canalith repositioning, 09811- Aquatic Therapy, [id number]- Electrical stimulation (unattended), [id number]- Electrical stimulation (manual), [id number] Physical performance testing, 91478- Vasopneumatic device, [id number]- Ultrasound, [id number]- Traction (mechanical), [id number]- Ionotophoresis 4mg /ml Dexamethasone, Patient/Family education, Balance training, Stair training, Taping, Dry Needling, Joint mobilization, Joint manipulation, Spinal manipulation, Scar mobilization, Vestibular training, Visual/preceptual remediation/compensation, DME instructions, Cryotherapy, and Moist heat.  All performed as medically necessary.  All included unless contraindicated  PLAN FOR NEXT SESSION: ROM check.  SPC use.    Chyrel Masson, PT, DPT, OCS, ATC 02/22/23  12:33 PM

## 2023-02-23 ENCOUNTER — Ambulatory Visit: Payer: Medicare HMO | Admitting: Physical Therapy

## 2023-02-23 ENCOUNTER — Encounter: Payer: Self-pay | Admitting: Physical Therapy

## 2023-02-23 DIAGNOSIS — M25662 Stiffness of left knee, not elsewhere classified: Secondary | ICD-10-CM | POA: Diagnosis not present

## 2023-02-23 DIAGNOSIS — M6281 Muscle weakness (generalized): Secondary | ICD-10-CM | POA: Diagnosis not present

## 2023-02-23 DIAGNOSIS — R262 Difficulty in walking, not elsewhere classified: Secondary | ICD-10-CM | POA: Diagnosis not present

## 2023-02-23 DIAGNOSIS — M25562 Pain in left knee: Secondary | ICD-10-CM | POA: Diagnosis not present

## 2023-02-23 DIAGNOSIS — G8929 Other chronic pain: Secondary | ICD-10-CM

## 2023-02-23 DIAGNOSIS — R600 Localized edema: Secondary | ICD-10-CM

## 2023-02-23 NOTE — Therapy (Addendum)
 OUTPATIENT PHYSICAL THERAPY TREATMENT   Patient Name: Keith Watkins MRN: [id number] DOB:09-Jun-1945, 78 y.o., male Today's Date: 02/23/2023  END OF SESSION:  PT End of Session - 02/23/23 0829     Visit Number 4    Number of Visits 20    Date for PT Re-Evaluation 04/27/23    Authorization Type [employer] $10 copay    Authorization - Visit Number 4    Progress Note Due on Visit 10    PT Start Time 0831    PT Stop Time 0938    PT Time Calculation (min) 67 min    Activity Tolerance Patient tolerated treatment well    Behavior During Therapy [facility] for tasks assessed/performed                Past Medical History:  Diagnosis Date   Arthritis    Back pain    Cataract    Cellulitis 07/31/2011   Complication of anesthesia    Pt wakes up too quickly   Enlarged prostate    Foot abscess, left 08/07/2011   Hypertension    prior to gastric bypass- no meds taken   Lumbar spinal stenosis    Obesity 08/09/2011   Past Surgical History:  Procedure Laterality Date   APPENDECTOMY  1963   CHOLECYSTECTOMY  1984   GASTRIC FUNDOPLICATION  1982   at [facility]   I & D EXTREMITY  08/07/2011   Procedure: IRRIGATION AND DEBRIDEMENT EXTREMITY;  Surgeon: Toni Arthurs, MD;  Location: [facility];  Service: Orthopedics;  Laterality: Left;  irrigation and debridement left foot abscess   KNEE ARTHROSCOPY Right    TOTAL KNEE ARTHROPLASTY Left 02/01/2023   Procedure: LEFT TOTAL KNEE ARTHROPLASTY;  Surgeon: Kathryne Hitch, MD;  Location: [facility] OR;  Service: Orthopedics;  Laterality: Left;   Patient Active Problem List   Diagnosis Date Noted   Status post total left knee replacement 02/01/2023   Bilateral lower extremity edema 06/21/2018   Cellulitis of left lower leg 08/21/2016   Eosinophilia 03/18/2014   Lumbar spinal stenosis    Benign prostatic hyperplasia (BPH) with urinary urge incontinence 08/09/2011    PCP: Donita Brooks, MD   REFERRING PROVIDER: Kathryne Hitch  REFERRING DIAG:  W09.811 Status post total left knee replacement  - Primary  M17.12 Primary osteoarthritis of left knee   THERAPY DIAG:  Chronic pain of left knee  Muscle weakness (generalized)  Stiffness of left knee, not elsewhere classified  Difficulty in walking, not elsewhere classified  Localized edema  Rationale for Evaluation and Treatment: Rehabilitation  ONSET DATE: Lt TKA 02/01/2023  SUBJECTIVE:   SUBJECTIVE STATEMENT: States the pain in his calf just feels like a cramp and feels tight. Other than that he feels good. States that he feels better when he walks out of a session than when he comes in. Arrives using RW and has wooden cane with him as well. Will be using SPC throughout treatment session today.   PERTINENT HISTORY: Lt TKA, Lumbar stenosis, History of Lt leg cellulitis, Lt foot wound (healed)   PAIN:  NPRS scale: no specific number given Pain location: around the L knee and some quad Pain description: tightness and throbbing Aggravating factors: bending the knee  Relieving factors: medicine- tylenol, ice   PRECAUTIONS: None  WEIGHT BEARING RESTRICTIONS: No  FALLS:  Has patient fallen in last 6 months? No  LIVING ENVIRONMENT: Lives with: lives with their family and currently living at daughters since she is  able to be with him 24/7 Lives in: House/apartment, daughters house has stairs with a rail and his home has a metal ramp Stairs: Yes: External: 4 steps; on right going up Has following equipment at home: Single point cane, Walker - 2 wheeled, Environmental consultant - 4 wheeled, and Ramped entry  OCCUPATION: retired  PLOF: Independent, ambulating with cane  PATIENT GOALS:  Reduce pain, walk better.    Next MD visit: 03/09/2023 Dr. Magnus Ivan, MD   OBJECTIVE:   DIAGNOSTIC FINDINGS: FINDINGS: Left knee arthroplasty in expected alignment. No periprosthetic lucency or fracture. There has been patellar resurfacing. Recent postsurgical  change includes air and edema in the soft tissues and joint space. Anterior skin staples.   IMPRESSION: Left knee arthroplasty without immediate postoperative complication.    PATIENT SURVEYS:  Patient-specific activity scoring scheme   "0" represents "unable to perform." "10" represents "able to perform at prior level. 0 1 2 3 4 5 6 7 8 9  10 (Date and Score) Activity Initial  Activity 02/16/2022    Stand for long time 6     2. Walk distances 0     3. Climb stairs 0   4.    5.     Total score = sum of the activity scores/number of activities Minimum detectable change (90%CI) for average score = 2 points Minimum detectable change (90%CI) for single activity score = 3 points  Score: average 2/10   COGNITION: 02/17/2023 Overall cognitive status: WFL    SENSATION: 02/17/2023 Not tested  EDEMA:  02/17/2023 Circumferential: 27.25in above knee 19.25 below knee  MUSCLE LENGTH: 02/17/2023 Not tested  POSTURE:  02/17/2023 No Significant postural limitations  PALPATION: 02/17/2023 Not tender to touch at calf or knee.   LOWER EXTREMITY ROM:   ROM Right 02/17/2023 Left 02/17/2023 Left  02/23/2023  Hip flexion     Hip extension     Hip abduction     Hip adduction     Hip internal rotation     Hip external rotation     Knee flexion  A:82 P: 87 A:88 P:95; due to pain  Knee extension  A: 2 P: 0 A: 0  Ankle dorsiflexion     Ankle plantarflexion     Ankle inversion     Ankle eversion      (Blank rows = not tested)  LOWER EXTREMITY MMT:  MMT Right 02/17/2023 Left 02/17/2023  Hip flexion    Hip extension    Hip abduction    Hip adduction    Hip internal rotation    Hip external rotation    Knee flexion    Knee extension  3/5 observed due to able to complete full range against gravity  Ankle dorsiflexion    Ankle plantarflexion    Ankle inversion    Ankle eversion     (Blank rows = not tested)  FUNCTIONAL TESTS:  02/17/2023 Not  tested  GAIT: 02/17/2023 Distance walked: clinic distances Assistive device utilized: Environmental consultant - 2 wheeled Level of assistance: Modified independence Comments: decreased step length, decreased stance time on L, decreased cadence  TODAY'S TREATMENT                                                                        DATE: 02/23/2023 Therex: Nustep Lvl 4 8 mins UE/LE.  Incline gastroc stretch 4x30sec   Theract: Leg press 75 lbs x15 Step ups 4in anterior and lateral x15ea; BUE to 1UE use as pt progressed; vc for proper knee bend   Neuro: Tandem 2x30sec ea; intermittent UE use for balance recovery Side stepping with UE hover; intermittent UE use when posterior   Manual: Seated L knee flexion with over pressure and distraction with IR to patient tolerance.   Self-care: Patient educated on proper height of wooden cane and how he can modify it to be the correct height. Also educated on use of metal cane and its functions.  Educated on use of barstool for sitting while doing tasks in his garage, proper positioning for ease and safety of standing and sitting, proper height, and its use to relieve back pain caused by standing while also strengthening back musculature. Patient tried 29in and 27in on corner of mat table demonstrating proper support of LE while remaining upright. Pt verbalized understanding including how to use to improve standing tolerance.   Vaso: L knee, , 34 deg, med pressure, BLE elevated in supine.   TREATMENT                                                                          DATE: 02/22/2023 Therex: Nustep Lvl 5 10 mins UE/LE.  Incline gastroc stretch 30 sec x 5 bilateral Supine quad set 5 sec hold x 5   Gait Training [facility] education and use with CGA with cane on Rt hand.  Cues for sequencing.   Performed 100 ft distance, 60 ft distance.   TherActivity (to improve progressive mobility, stairs, squatting, transfers, stair and ramp navigation.) Leg press double leg 75 lbs x 15 , single leg Lt 31 lbs x 15 (skip tomorrow for recovery) SPC use on ramp and curb with CGA with sequencing placement of cane and using good leg up/surgery leg down 6 inch stpe  Manual: Seated Lt knee flexion c mobilization c movement IR/distraction.   Vaso 10 mins Lt knee 34 deg medium compression in elevation supine.   TREATMENT                                                                          DATE: 02/18/2023 Therex: Nustep Lvl 5 9 mins UE/LE.  Incline gastroc stretch 30 sec x 3 bilateral Seated Lt leg LAQ 2.5 lbs with end range pauses 2 x 10 c contralateral leg movement opposite.  Education and performance of ankle pumps  for edema control activity at home.  Suggested when elevated and icing.   Neuro Re-ed (to improve neuromuscular recruitment) Isometric 5 sec extension, 5 sec flexion x 12  Supine quad set Lt leg 5 sec hold x 10  Education on neural recruitment aspects of intervention.   Manual: Seated Lt knee flexion c mobilization c movement IR/distraction.   Vaso 10 mins Lt knee 34 deg medium compression in elevation supine.   TREATMENT                                                                          DATE: 02/17/2023 Theract: Bed mobility- instructed on log roll to side with contralateral reach to side of bed to fully reach sidelying before pushing through elbow and other arm to reach seated position as legs swing down. Patient walked through step by step reaching seated position stating that this was done with more ease than previous trials at home. Verbalized and demonstrated understanding. Required increased time.  Therex:    HEP instruction/performance c cues for techniques, handout provided.  Trial set performed of each for comprehension and symptom assessment.  See below for  exercise list  PATIENT EDUCATION:  Education details: HEP, POC Person educated: Patient Education method: Explanation, Demonstration, Verbal cues, and Handouts Education comprehension: verbalized understanding, returned demonstration, and verbal cues required  HOME EXERCISE PROGRAM: Access Code: [email or web address] URL: [email or web address] Date: 02/17/2023 Prepared by: Chyrel Masson   Exercises - Supine Heel Slide (Mirrored)  - 3-5 x daily - 7 x weekly - 1 sets - 10 reps - 5 hold - Supine Heel Slide with Strap  - 3-5 x daily - 7 x weekly - 1 sets - 10 reps - 5 hold - Seated Long Arc Quad (Mirrored)  - 3-5 x daily - 7 x weekly - 1 sets - 5-10 reps - 2 hold - Supine Knee Extension Mobilization with Weight (Mirrored)  - 4-5 x daily - 7 x weekly - 1 sets - 1 reps - to tolerance up to 15 mins hold - Seated Quad Set (Mirrored)  - 3-5 x daily - 7 x weekly - 1 sets - 10 reps - 5 hold  ASSESSMENT:  CLINICAL IMPRESSION: Pt did well with activity during the session and was able to tolerate ambulation in the clinic with SPC. Continues to have deficits in balance as seen with tandem balancing and side stepping as patient required intermittent UE assist to regain balance. Continues to make progress towards goals and will benefit from skilled physical therapy to address deficits.   OBJECTIVE IMPAIRMENTS: Abnormal gait, decreased activity tolerance, decreased balance, decreased coordination, decreased endurance, decreased knowledge of use of DME, decreased mobility, difficulty walking, decreased ROM, decreased strength, increased edema, obesity, and pain.   ACTIVITY LIMITATIONS: carrying, lifting, bending, sitting, standing, squatting, stairs, bed mobility, and locomotion level  PARTICIPATION LIMITATIONS: meal prep, cleaning, laundry, interpersonal relationship, driving, shopping, community activity, occupation, and yard work  PERSONAL FACTORS: Age, Fitness, Time since onset of  injury/illness/exacerbation, and 3+ comorbidities: PMH includes BPH, lumbar spinal stenosis, PVD, HTN  are also affecting patient's functional outcome.   REHAB POTENTIAL: Good  CLINICAL DECISION MAKING: Evolving/moderate complexity  EVALUATION COMPLEXITY: Moderate   GOALS: Goals reviewed with patient?  Yes  SHORT TERM GOALS: (target date for Short term goals are 3 weeks 03/09/2023)   1.  Patient will demonstrate independent use of home exercise program to maintain progress from in clinic treatments.  Goal status: on going 02/17/2023  LONG TERM GOALS: (target dates for all long term goals are 10 weeks  04/27/2023 )   1. Patient will demonstrate/report pain at worst less than or equal to 2/10 to facilitate minimal limitation in daily activity secondary to pain symptoms.  Goal status: New   2. Patient will demonstrate independent use of home exercise program to facilitate ability to maintain/progress functional gains from skilled physical therapy services.  Goal status: New  3. Patient will demonstrate/report PSFS scoring at average > or = 8/10 scoring for improved functional activity performance.  Goal status: New   4.  Patient will demonstrate L LE MMT 5/5 throughout to faciltiate usual transfers, stairs, squatting at [facility] for daily life.   Goal status: New   5.  Patient will demonstrate or report ability to perform 18in chair transfer without UE assist. Goal status: New   6.  Patient will ambulate community distances with LRAD independently to decrease caregiver burden Goal status: New   7.  Patient will demonstrate 0-115 deg knee AROM to improve mobility and functional tasks. Goal Status: New  8. Patient will demonstrate ascending/descending stairs reciprocally s UE assist for community integration.   Goal status : new    PLAN:  PT FREQUENCY: 1-2x/week  PT DURATION: 10 weeks  PLANNED INTERVENTIONS: Can include 40981- PT Re-evaluation, 97110-Therapeutic exercises,  97530- Therapeutic activity, [id number]- Neuromuscular re-education, 97535- Self Care, 97140- Manual therapy, [id number]- Gait training, [phone]- Orthotic Fit/training, [phone]- Canalith repositioning, [id number]- Aquatic Therapy, 97014- Electrical stimulation (unattended), [id number]- Electrical stimulation (manual), [id number] Physical performance testing, 97016- Vasopneumatic device, [id number]- Ultrasound, [id number]- Traction (mechanical), [id number]- Ionotophoresis 4mg /ml Dexamethasone, Patient/Family education, Balance training, Stair training, Taping, Dry Needling, Joint mobilization, Joint manipulation, Spinal manipulation, Scar mobilization, Vestibular training, Visual/preceptual remediation/compensation, DME instructions, Cryotherapy, and Moist heat.  All performed as medically necessary.  All included unless contraindicated  PLAN FOR NEXT SESSION: Continue SPC ambulation, balance activities, and overall LE strengthening, and activities for flexion ROM.   Margrett Kalb, Gurpreet Mariani, Student-PT 02/23/2023, 9:50 AM   This entire session of physical therapy was performed under the direct supervision of PT signing evaluation /treatment. PT reviewed note and agrees.   Vladimir Faster, PT, DPT 02/23/2023, 11:42 AM

## 2023-02-25 ENCOUNTER — Other Ambulatory Visit: Payer: Self-pay | Admitting: Family Medicine

## 2023-02-25 DIAGNOSIS — N3941 Urge incontinence: Secondary | ICD-10-CM

## 2023-03-02 ENCOUNTER — Ambulatory Visit (INDEPENDENT_AMBULATORY_CARE_PROVIDER_SITE_OTHER): Payer: Medicare HMO | Admitting: Rehabilitative and Restorative Service Providers"

## 2023-03-02 ENCOUNTER — Encounter: Payer: Self-pay | Admitting: Rehabilitative and Restorative Service Providers"

## 2023-03-02 DIAGNOSIS — R262 Difficulty in walking, not elsewhere classified: Secondary | ICD-10-CM | POA: Diagnosis not present

## 2023-03-02 DIAGNOSIS — M25562 Pain in left knee: Secondary | ICD-10-CM | POA: Diagnosis not present

## 2023-03-02 DIAGNOSIS — M6281 Muscle weakness (generalized): Secondary | ICD-10-CM

## 2023-03-02 DIAGNOSIS — R600 Localized edema: Secondary | ICD-10-CM

## 2023-03-02 DIAGNOSIS — M25662 Stiffness of left knee, not elsewhere classified: Secondary | ICD-10-CM | POA: Diagnosis not present

## 2023-03-02 DIAGNOSIS — G8929 Other chronic pain: Secondary | ICD-10-CM

## 2023-03-02 NOTE — Therapy (Signed)
 OUTPATIENT PHYSICAL THERAPY TREATMENT   Patient Name: Keith Watkins MRN: [id number] DOB:1945/03/17, 78 y.o., male Today's Date: 03/02/2023  END OF SESSION:  PT End of Session - 03/02/23 1834     Visit Number 5    Number of Visits 20    Date for PT Re-Evaluation 04/27/23    Authorization Type [employer] $10 copay    Progress Note Due on Visit 10    PT Start Time 1150    PT Stop Time 1240    PT Time Calculation (min) 50 min    Activity Tolerance Patient tolerated treatment well;No increased pain;Patient limited by pain    Behavior During Therapy [facility] for tasks assessed/performed             Past Medical History:  Diagnosis Date   Arthritis    Back pain    Cataract    Cellulitis 07/31/2011   Complication of anesthesia    Pt wakes up too quickly   Enlarged prostate    Foot abscess, left 08/07/2011   Hypertension    prior to gastric bypass- no meds taken   Lumbar spinal stenosis    Obesity 08/09/2011   Past Surgical History:  Procedure Laterality Date   APPENDECTOMY  1963   CHOLECYSTECTOMY  1984   GASTRIC FUNDOPLICATION  1982   at [facility]   I & D EXTREMITY  08/07/2011   Procedure: IRRIGATION AND DEBRIDEMENT EXTREMITY;  Surgeon: Toni Arthurs, MD;  Location: [facility];  Service: Orthopedics;  Laterality: Left;  irrigation and debridement left foot abscess   KNEE ARTHROSCOPY Right    TOTAL KNEE ARTHROPLASTY Left 02/01/2023   Procedure: LEFT TOTAL KNEE ARTHROPLASTY;  Surgeon: Kathryne Hitch, MD;  Location: [facility] OR;  Service: Orthopedics;  Laterality: Left;   Patient Active Problem List   Diagnosis Date Noted   Status post total left knee replacement 02/01/2023   Bilateral lower extremity edema 06/21/2018   Cellulitis of left lower leg 08/21/2016   Eosinophilia 03/18/2014   Lumbar spinal stenosis    Benign prostatic hyperplasia (BPH) with urinary urge incontinence 08/09/2011    PCP: Donita Brooks, MD   REFERRING PROVIDER: Kathryne Hitch  REFERRING DIAG:  W09.811 Status post total left knee replacement  - Primary  M17.12 Primary osteoarthritis of left knee   THERAPY DIAG:  Chronic pain of left knee  Muscle weakness (generalized)  Stiffness of left knee, not elsewhere classified  Difficulty in walking, not elsewhere classified  Localized edema  Rationale for Evaluation and Treatment: Rehabilitation  ONSET DATE: Lt TKA 02/01/2023  SUBJECTIVE:   SUBJECTIVE STATEMENT: Keith Watkins notes sleep is not affected by his knee.  Pain is managed with a muscle relaxer and a tylenol.     PERTINENT HISTORY: Lt TKA, Lumbar stenosis, History of Lt leg cellulitis, Lt foot wound (healed)   PAIN:  NPRS scale: 0-5/10 this week Pain location: around the L knee and some quad Pain description: tightness and throbbing Aggravating factors: bending the knee  Relieving factors: medicine- Tylenol, ice   PRECAUTIONS: None  WEIGHT BEARING RESTRICTIONS: No  FALLS:  Has patient fallen in last 6 months? No  LIVING ENVIRONMENT: Lives with: lives with their family and currently living at daughters since she is able to be with him 24/7 Lives in: House/apartment, daughters house has stairs with a rail and his home has a metal ramp Stairs: Yes: External: 4 steps; on right going up Has following equipment at home: Single point cane, Walker -  2 wheeled, Walker - 4 wheeled, and Ramped entry  OCCUPATION: retired  PLOF: Independent, ambulating with cane  PATIENT GOALS:  Reduce pain, walk better.    Next MD visit: 03/09/2023 Dr. Magnus Ivan, MD   OBJECTIVE:   DIAGNOSTIC FINDINGS: FINDINGS: Left knee arthroplasty in expected alignment. No periprosthetic lucency or fracture. There has been patellar resurfacing. Recent postsurgical change includes air and edema in the soft tissues and joint space. Anterior skin staples.   IMPRESSION: Left knee arthroplasty without immediate postoperative complication.    PATIENT SURVEYS:   Patient-specific activity scoring scheme   "0" represents "unable to perform." "10" represents "able to perform at prior level. 0 1 2 3 4 5 6 7 8 9  10 (Date and Score) Activity Initial  Activity 02/16/2022    Stand for long time 6     2. Walk distances 0     3. Climb stairs 0   4.    5.     Total score = sum of the activity scores/number of activities Minimum detectable change (90%CI) for average score = 2 points Minimum detectable change (90%CI) for single activity score = 3 points  Score: average 2/10   COGNITION: 02/17/2023 Overall cognitive status: WFL    SENSATION: 02/17/2023 Not tested  EDEMA:  02/17/2023 Circumferential: 27.25 in above knee 19.25 below knee  MUSCLE LENGTH: 02/17/2023 Not tested  POSTURE:  02/17/2023 No Significant postural limitations  PALPATION: 02/17/2023 Not tender to touch at calf or knee.   LOWER EXTREMITY ROM:   ROM Right 02/17/2023 Left 02/17/2023 Left  02/23/2023  Hip flexion     Hip extension     Hip abduction     Hip adduction     Hip internal rotation     Hip external rotation     Knee flexion  A:82 P: 87 A:88 P:95; due to pain  Knee extension  A: 2 P: 0 A: 0  Ankle dorsiflexion     Ankle plantarflexion     Ankle inversion     Ankle eversion      (Blank rows = not tested)  LOWER EXTREMITY MMT:  MMT Right 02/17/2023 Left 02/17/2023  Hip flexion    Hip extension    Hip abduction    Hip adduction    Hip internal rotation    Hip external rotation    Knee flexion    Knee extension  3/5 observed due to able to complete full range against gravity  Ankle dorsiflexion    Ankle plantarflexion    Ankle inversion    Ankle eversion     (Blank rows = not tested)  FUNCTIONAL TESTS:  02/17/2023 Not tested  GAIT: 02/17/2023 Distance walked: clinic distances Assistive device utilized: Environmental consultant - 2 wheeled Level of assistance: Modified independence Comments: decreased step length, decreased stance time on L, decreased  cadence  TODAY'S TREATMENT                                                                        DATE:  03/02/2023 Recumbent bike Seat 7 for 5 minutes AAROM to full range Supine quadriceps stretch with PT assist 5 x 20 seconds Quadriceps sets 10 x 5 seconds  Functional Activities: Double Leg Press 87# 15 x slow eccentrics & 100# 10 x slow eccentrics Single Leg Press 43# 10 x slow eccentrics  Neuromuscular re-education: High marching with both hands and only the right hand; heel to toe dynamic balance using only the right hand; step-up and over hurdles using only the right hand  Vaso left knee 10 minutes Medium 34*   02/23/2023 Therex: Nustep Lvl 4 8 mins UE/LE.  Incline gastroc stretch 4x30sec   Theract: Leg press 75 lbs x15 Step ups 4in anterior and lateral x15ea; BUE to 1UE use as pt progressed; vc for proper knee bend   Neuro: Tandem 2x30sec ea; intermittent UE use for balance recovery Side stepping with UE hover; intermittent UE use when posterior   Manual: Seated L knee flexion with over pressure and distraction with IR to patient tolerance.   Self-care: Patient educated on proper height of wooden cane and how he can modify it to be the correct height. Also educated on use of metal cane and its functions.  Educated on use of barstool for sitting while doing tasks in his garage, proper positioning for ease and safety of standing and sitting, proper height, and its use to relieve back pain caused by standing while also strengthening back musculature. Patient tried 29in and 27in on corner of mat table demonstrating proper support of LE while remaining upright. Pt verbalized understanding including how to use to improve standing tolerance.   Vaso: L knee, , 34 deg, med pressure, BLE elevated in supine.     02/22/2023 Therex: Nustep Lvl 5 10 mins UE/LE.  Incline gastroc stretch 30 sec x 5 bilateral Supine quad set 5 sec hold x 5   Gait Training [facility] education and use with CGA with cane on Rt hand.  Cues for sequencing.  Performed 100 ft distance, 60 ft distance.   TherActivity (to improve progressive mobility, stairs, squatting, transfers, stair and ramp navigation.) Leg press double leg 75 lbs x 15 , single leg Lt 31 lbs x 15 (skip tomorrow for recovery) SPC use on ramp and curb with CGA with sequencing placement of cane and using good leg up/surgery leg down 6 inch stpe  Manual: Seated Lt knee flexion c mobilization c movement IR/distraction.   Vaso 10 mins Lt knee 34 deg medium compression in elevation supine.   PATIENT EDUCATION:  Education details: HEP, POC Person educated: Patient Education method: Programmer, multimedia, Demonstration, Verbal cues, and Handouts Education comprehension: verbalized understanding, returned demonstration, and verbal cues required  HOME EXERCISE PROGRAM: Access Code: [email or web address] URL: [email or web address] Date: 03/02/2023 Prepared by: Pauletta Browns  Exercises - Supine Heel Slide (Mirrored)  - 3-5 x daily - 7 x weekly - 1 sets - 10 reps - 5 hold - Supine Heel Slide with Strap  - 3-5 x daily - 7 x weekly - 1 sets - 10 reps - 5 hold - Seated Long  Arc Quad (Mirrored)  - 3-5 x daily - 7 x weekly - 1 sets - 5-10 reps - 2 hold - Supine Knee Extension Mobilization with Weight (Mirrored)  - 4-5 x daily - 7 x weekly - 1 sets - 1 reps - to tolerance up to 15 mins hold - Seated Quad Set (Mirrored)  - 3-5 x daily - 7 x weekly - 1 sets - 10 reps - 5 hold - Supine Quadriceps Stretch with Strap on Table  - 2 x daily - 7 x weekly - 5 reps - 20 seconds hold  ASSESSMENT:  CLINICAL IMPRESSION: Keith Watkins has excellent knee extension active range of motion post TKA.  Flexion active range of motion looks as if it is improving, although we did not objectively assess  this today.  Keith Watkins was able to progress his weights with his functional leg press, demonstrating strength improvements and improved ability to get out of a chair, car or navigate stairs.  We added a quadriceps stretch as and had a hard time stepping over hurdles secondary to difficulty with knee flexion due to a tight rectus femoris.  Improving in this area will make stairs and overall function much better.  Continue his current plan of care with particular emphasis on knee flexion active range of motion, rectus femoris flexibility, strength and functional progressions to get Keith Watkins comfortable off of the walker and allow him to meet all long-term goals.  OBJECTIVE IMPAIRMENTS: Abnormal gait, decreased activity tolerance, decreased balance, decreased coordination, decreased endurance, decreased knowledge of use of DME, decreased mobility, difficulty walking, decreased ROM, decreased strength, increased edema, obesity, and pain.   ACTIVITY LIMITATIONS: carrying, lifting, bending, sitting, standing, squatting, stairs, bed mobility, and locomotion level  PARTICIPATION LIMITATIONS: meal prep, cleaning, laundry, interpersonal relationship, driving, shopping, community activity, occupation, and yard work  PERSONAL FACTORS: Age, Fitness, Time since onset of injury/illness/exacerbation, and 3+ comorbidities: PMH includes BPH, lumbar spinal stenosis, PVD, HTN  are also affecting patient's functional outcome.   REHAB POTENTIAL: Good  CLINICAL DECISION MAKING: Evolving/moderate complexity  EVALUATION COMPLEXITY: Moderate   GOALS: Goals reviewed with patient? Yes  SHORT TERM GOALS: (target date for Short term goals are 3 weeks 03/09/2023)   1.  Patient will demonstrate independent use of home exercise program to maintain progress from in clinic treatments.  Goal status: Met 03/02/2023  LONG TERM GOALS: (target dates for all long term goals are 10 weeks  04/27/2023 )   1. Patient will demonstrate/report pain at  worst less than or equal to 2/10 to facilitate minimal limitation in daily activity secondary to pain symptoms.  Goal status: New   2. Patient will demonstrate independent use of home exercise program to facilitate ability to maintain/progress functional gains from skilled physical therapy services.  Goal status: New  3. Patient will demonstrate/report PSFS scoring at average > or = 8/10 scoring for improved functional activity performance.  Goal status: New   4.  Patient will demonstrate L LE MMT 5/5 throughout to faciltiate usual transfers, stairs, squatting at [facility] for daily life.   Goal status: New   5.  Patient will demonstrate or report ability to perform 18in chair transfer without UE assist. Goal status: New   6.  Patient will ambulate community distances with LRAD independently to decrease caregiver burden Goal status: New   7.  Patient will demonstrate 0-115 deg knee AROM to improve mobility and functional tasks. Goal Status: New  8. Patient will demonstrate ascending/descending stairs reciprocally s UE assist for  community integration.   Goal status : new    PLAN:  PT FREQUENCY: 1-2x/week  PT DURATION: 10 weeks  PLANNED INTERVENTIONS: Can include 40981- PT Re-evaluation, 97110-Therapeutic exercises, 97530- Therapeutic activity, [id number]- Neuromuscular re-education, 97535- Self Care, 97140- Manual therapy, [id number]- Gait training, [phone]- Orthotic Fit/training, [phone]- Canalith repositioning, [id number]- Aquatic Therapy, 97014- Electrical stimulation (unattended), [id number]- Electrical stimulation (manual), [id number] Physical performance testing, 97016- Vasopneumatic device, [id number]- Ultrasound, [id number]- Traction (mechanical), [id number]- Ionotophoresis 4mg /ml Dexamethasone, Patient/Family education, Balance training, Stair training, Taping, Dry Needling, Joint mobilization, Joint manipulation, Spinal manipulation, Scar mobilization, Vestibular training, Visual/preceptual remediation/compensation,  DME instructions, Cryotherapy, and Moist heat.  All performed as medically necessary.  All included unless contraindicated  PLAN FOR NEXT SESSION: Continue SPC ambulation drills, activities, balance activities, overall LE strengthening, and activities for flexion AROM.  Cherlyn Cushing PT, MPT   Cherlyn Cushing, PT, MPT 03/02/2023, 6:41 PM

## 2023-03-04 ENCOUNTER — Encounter: Payer: Self-pay | Admitting: Rehabilitative and Restorative Service Providers"

## 2023-03-04 ENCOUNTER — Ambulatory Visit: Payer: Medicare HMO | Admitting: Rehabilitative and Restorative Service Providers"

## 2023-03-04 DIAGNOSIS — R262 Difficulty in walking, not elsewhere classified: Secondary | ICD-10-CM

## 2023-03-04 DIAGNOSIS — G8929 Other chronic pain: Secondary | ICD-10-CM | POA: Diagnosis not present

## 2023-03-04 DIAGNOSIS — M25562 Pain in left knee: Secondary | ICD-10-CM

## 2023-03-04 DIAGNOSIS — R600 Localized edema: Secondary | ICD-10-CM

## 2023-03-04 DIAGNOSIS — M25662 Stiffness of left knee, not elsewhere classified: Secondary | ICD-10-CM | POA: Diagnosis not present

## 2023-03-04 DIAGNOSIS — M6281 Muscle weakness (generalized): Secondary | ICD-10-CM | POA: Diagnosis not present

## 2023-03-04 NOTE — Therapy (Signed)
 OUTPATIENT PHYSICAL THERAPY TREATMENT   Patient Name: Keith Watkins MRN: [id number] DOB:July 21, 1945, 78 y.o., male Today's Date: 03/04/2023  END OF SESSION:  PT End of Session - 03/04/23 1014     Visit Number 6    Number of Visits 20    Date for PT Re-Evaluation 04/27/23    Authorization Type [employer] $10 copay    Progress Note Due on Visit 10    PT Start Time 0930    PT Stop Time 1025    PT Time Calculation (min) 55 min    Activity Tolerance Patient tolerated treatment well;No increased pain    Behavior During Therapy [facility] for tasks assessed/performed              Past Medical History:  Diagnosis Date   Arthritis    Back pain    Cataract    Cellulitis 07/31/2011   Complication of anesthesia    Pt wakes up too quickly   Enlarged prostate    Foot abscess, left 08/07/2011   Hypertension    prior to gastric bypass- no meds taken   Lumbar spinal stenosis    Obesity 08/09/2011   Past Surgical History:  Procedure Laterality Date   APPENDECTOMY  1963   CHOLECYSTECTOMY  1984   GASTRIC FUNDOPLICATION  1982   at [facility]   I & D EXTREMITY  08/07/2011   Procedure: IRRIGATION AND DEBRIDEMENT EXTREMITY;  Surgeon: Toni Arthurs, MD;  Location: [facility];  Service: Orthopedics;  Laterality: Left;  irrigation and debridement left foot abscess   KNEE ARTHROSCOPY Right    TOTAL KNEE ARTHROPLASTY Left 02/01/2023   Procedure: LEFT TOTAL KNEE ARTHROPLASTY;  Surgeon: Kathryne Hitch, MD;  Location: [facility] OR;  Service: Orthopedics;  Laterality: Left;   Patient Active Problem List   Diagnosis Date Noted   Status post total left knee replacement 02/01/2023   Bilateral lower extremity edema 06/21/2018   Cellulitis of left lower leg 08/21/2016   Eosinophilia 03/18/2014   Lumbar spinal stenosis    Benign prostatic hyperplasia (BPH) with urinary urge incontinence 08/09/2011    PCP: Donita Brooks, MD   REFERRING PROVIDER: Kathryne Hitch  REFERRING  DIAG:  W09.811 Status post total left knee replacement  - Primary  M17.12 Primary osteoarthritis of left knee   THERAPY DIAG:  Chronic pain of left knee  Muscle weakness (generalized)  Stiffness of left knee, not elsewhere classified  Difficulty in walking, not elsewhere classified  Localized edema  Rationale for Evaluation and Treatment: Rehabilitation  ONSET DATE: Lt TKA 02/01/2023  SUBJECTIVE:   SUBJECTIVE STATEMENT: Ed notes sleep is still not affected by his knee.  He is still working hard at his HEP.  Pain is managed with a muscle relaxer and a tylenol.     PERTINENT HISTORY: Lt TKA, Lumbar stenosis, History of Lt leg cellulitis, Lt foot wound (healed)   PAIN:  NPRS scale: 0-5/10 this week, muscle relaxer before PT only Pain location: Around the L knee and some quad Pain description: Tightness and throbbing Aggravating factors: bending the knee  Relieving factors: medicine- Tylenol, ice   PRECAUTIONS: None  WEIGHT BEARING RESTRICTIONS: No  FALLS:  Has patient fallen in last 6 months? No  LIVING ENVIRONMENT: Lives with: lives with their family and currently living at daughters since she is able to be with him 24/7 Lives in: House/apartment, daughters house has stairs with a rail and his home has a metal ramp Stairs: Yes: External: 4 steps;  on right going up Has following equipment at home: Single point cane, Walker - 2 wheeled, Environmental consultant - 4 wheeled, and Ramped entry  OCCUPATION: retired  PLOF: Independent, ambulating with cane  PATIENT GOALS:  Reduce pain, walk better.    Next MD visit: 03/09/2023 Dr. Magnus Ivan, MD   OBJECTIVE:   DIAGNOSTIC FINDINGS: FINDINGS: Left knee arthroplasty in expected alignment. No periprosthetic lucency or fracture. There has been patellar resurfacing. Recent postsurgical change includes air and edema in the soft tissues and joint space. Anterior skin staples.   IMPRESSION: Left knee arthroplasty without immediate  postoperative complication.    PATIENT SURVEYS:  Patient-specific activity scoring scheme   "0" represents "unable to perform." "10" represents "able to perform at prior level. 0 1 2 3 4 5 6 7 8 9  10 (Date and Score) Activity Initial  Activity 02/16/2022    Stand for long time 6     2. Walk distances 0     3. Climb stairs 0   4.    5.     Total score = sum of the activity scores/number of activities Minimum detectable change (90%CI) for average score = 2 points Minimum detectable change (90%CI) for single activity score = 3 points  Score: average 2/10   COGNITION: 02/17/2023 Overall cognitive status: WFL    SENSATION: 02/17/2023 Not tested  EDEMA:  02/17/2023 Circumferential: 27.25 in above knee 19.25 below knee  MUSCLE LENGTH: 02/17/2023 Not tested  POSTURE:  02/17/2023 No Significant postural limitations  PALPATION: 02/17/2023 Not tender to touch at calf or knee.   LOWER EXTREMITY ROM:   ROM Right 02/17/2023 Left 02/17/2023 Left  02/23/2023 Left 03/04/2023  Hip flexion      Hip extension      Hip abduction      Hip adduction      Hip internal rotation      Hip external rotation      Knee flexion  A:82 P: 87 A:88 P:95; due to pain 114  Knee extension  A: 2 P: 0 A: 0 0  Ankle dorsiflexion      Ankle plantarflexion      Ankle inversion      Ankle eversion       (Blank rows = not tested)  LOWER EXTREMITY MMT:  MMT Right 02/17/2023 Left 02/17/2023  Hip flexion    Hip extension    Hip abduction    Hip adduction    Hip internal rotation    Hip external rotation    Knee flexion    Knee extension  3/5 observed due to able to complete full range against gravity  Ankle dorsiflexion    Ankle plantarflexion    Ankle inversion    Ankle eversion     (Blank rows = not tested)  FUNCTIONAL TESTS:  02/17/2023 Not tested  GAIT: 02/17/2023 Distance walked: clinic distances Assistive device utilized: Environmental consultant - 2 wheeled Level of assistance: Modified  independence Comments: decreased step length, decreased stance time on L, decreased cadence  TODAY'S TREATMENT                                                                        DATE:  03/04/2023 Recumbent bike Seat 7 for 5 minutes full range from the start Supine quadriceps stretch with PT assist 5 x 20 seconds Quadriceps sets 10 x 5 seconds Seated straight leg raises 3 sets of 5 for 3 seconds  Functional Activities: Double Leg Press 100# 15 x slow eccentrics  Single Leg Press 50# 10 x slow eccentrics Slow step-down off 4 inch step (slow eccentrics) 10 x  Neuromuscular re-education: High marching with only the right hand for balance; heel to toe dynamic balance forward and back using only the right hand for balance  Vaso left knee 10 minutes Medium 34*   03/02/2023 Recumbent bike Seat 7 for 5 minutes AAROM to full range Supine quadriceps stretch with PT assist 5 x 20 seconds Quadriceps sets 10 x 5 seconds  Functional Activities: Double Leg Press 87# 15 x slow eccentrics & 100# 10 x slow eccentrics Single Leg Press 43# 10 x slow eccentrics  Neuromuscular re-education: High marching with both hands and only the right hand; heel to toe dynamic balance using only the right hand; step-up and over hurdles using only the right hand  Vaso left knee 10 minutes Medium 34*   02/23/2023 Therex: Nustep Lvl 4 8 mins UE/LE.  Incline gastroc stretch 4x30sec   Theract: Leg press 75 lbs x15 Step ups 4in anterior and lateral x15ea; BUE to 1UE use as pt progressed; vc for proper knee bend   Neuro: Tandem 2x30sec ea; intermittent UE use for balance recovery Side stepping with UE hover; intermittent UE use when posterior   Manual: Seated L knee flexion with over pressure and distraction with IR to patient tolerance.    Self-care: Patient educated on proper height of wooden cane and how he can modify it to be the correct height. Also educated on use of metal cane and its functions.  Educated on use of barstool for sitting while doing tasks in his garage, proper positioning for ease and safety of standing and sitting, proper height, and its use to relieve back pain caused by standing while also strengthening back musculature. Patient tried 29in and 27in on corner of mat table demonstrating proper support of LE while remaining upright. Pt verbalized understanding including how to use to improve standing tolerance.   Vaso: L knee, , 34 deg, med pressure, BLE elevated in supine.    HOME EXERCISE PROGRAM: Access Code: [email or web address] URL: [email or web address] Date: 03/02/2023 Prepared by: Pauletta Browns  Exercises - Supine Heel Slide (Mirrored)  - 3-5 x daily - 7 x weekly - 1 sets - 10 reps - 5 hold - Supine Heel Slide with Strap  - 3-5 x daily - 7 x weekly - 1 sets - 10 reps - 5 hold - Seated Long Arc Quad (Mirrored)  - 3-5 x daily - 7 x weekly - 1 sets - 5-10 reps - 2 hold - Supine Knee Extension Mobilization with Weight (Mirrored)  - 4-5 x daily - 7 x weekly - 1 sets - 1 reps - to tolerance up to 15 mins hold - Seated Quad Set (Mirrored)  -  3-5 x daily - 7 x weekly - 1 sets - 10 reps - 5 hold - Supine Quadriceps Stretch with Strap on Table  - 2 x daily - 7 x weekly - 5 reps - 20 seconds hold  ASSESSMENT:  CLINICAL IMPRESSION: Active range of motion was 0 - 114 degrees today.  We spent more time on functional activities and activities to appropriately progress Ed to a cane from a walker.  Continue his current plan of care with particular emphasis on knee flexion active range of motion, rectus femoris flexibility, strength and functional progressions to get Ed comfortable off of the walker and allow him to meet all long-term goals.  OBJECTIVE IMPAIRMENTS: Abnormal gait, decreased activity  tolerance, decreased balance, decreased coordination, decreased endurance, decreased knowledge of use of DME, decreased mobility, difficulty walking, decreased ROM, decreased strength, increased edema, obesity, and pain.   ACTIVITY LIMITATIONS: carrying, lifting, bending, sitting, standing, squatting, stairs, bed mobility, and locomotion level  PARTICIPATION LIMITATIONS: meal prep, cleaning, laundry, interpersonal relationship, driving, shopping, community activity, occupation, and yard work  PERSONAL FACTORS: Age, Fitness, Time since onset of injury/illness/exacerbation, and 3+ comorbidities: PMH includes BPH, lumbar spinal stenosis, PVD, HTN  are also affecting patient's functional outcome.   REHAB POTENTIAL: Good  CLINICAL DECISION MAKING: Evolving/moderate complexity  EVALUATION COMPLEXITY: Moderate   GOALS: Goals reviewed with patient? Yes  SHORT TERM GOALS: (target date for Short term goals are 3 weeks 03/09/2023)   1.  Patient will demonstrate independent use of home exercise program to maintain progress from in clinic treatments.  Goal status: Met 03/02/2023  LONG TERM GOALS: (target dates for all long term goals are 10 weeks  04/27/2023 )   1. Patient will demonstrate/report pain at worst less than or equal to 2/10 to facilitate minimal limitation in daily activity secondary to pain symptoms.  Goal status: New   2. Patient will demonstrate independent use of home exercise program to facilitate ability to maintain/progress functional gains from skilled physical therapy services.  Goal status: New  3. Patient will demonstrate/report PSFS scoring at average > or = 8/10 scoring for improved functional activity performance.  Goal status: New   4.  Patient will demonstrate L LE MMT 5/5 throughout to faciltiate usual transfers, stairs, squatting at [facility] for daily life.   Goal status: New   5.  Patient will demonstrate or report ability to perform 18in chair transfer without UE  assist. Goal status: New   6.  Patient will ambulate community distances with LRAD independently to decrease caregiver burden Goal status: New   7.  Patient will demonstrate 0-115 deg knee AROM to improve mobility and functional tasks. Goal Status: New  8. Patient will demonstrate ascending/descending stairs reciprocally s UE assist for community integration.   Goal status : new    PLAN:  PT FREQUENCY: 1-2x/week  PT DURATION: 10 weeks  PLANNED INTERVENTIONS: Can include 78295- PT Re-evaluation, 97110-Therapeutic exercises, 97530- Therapeutic activity, [id number]- Neuromuscular re-education, 97535- Self Care, 97140- Manual therapy, [id number]- Gait training, [phone]- Orthotic Fit/training, [phone]- Canalith repositioning, [id number]- Aquatic Therapy, 97014- Electrical stimulation (unattended), [id number]- Electrical stimulation (manual), [id number] Physical performance testing, 97016- Vasopneumatic device, [id number]- Ultrasound, [id number]- Traction (mechanical), [id number]- Ionotophoresis 4mg /ml Dexamethasone, Patient/Family education, Balance training, Stair training, Taping, Dry Needling, Joint mobilization, Joint manipulation, Spinal manipulation, Scar mobilization, Vestibular training, Visual/preceptual remediation/compensation, DME instructions, Cryotherapy, and Moist heat.  All performed as medically necessary.  All included unless contraindicated  PLAN FOR NEXT SESSION: Continue SPC ambulation drills, activities, balance activities,  overall LE strengthening, and activities for flexion AROM.  Cherlyn Cushing PT, MPT   Cherlyn Cushing, PT, MPT 03/04/2023, 5:14 PM

## 2023-03-08 ENCOUNTER — Encounter: Payer: Self-pay | Admitting: Rehabilitative and Restorative Service Providers"

## 2023-03-08 ENCOUNTER — Ambulatory Visit (INDEPENDENT_AMBULATORY_CARE_PROVIDER_SITE_OTHER): Payer: Medicare HMO | Admitting: Rehabilitative and Restorative Service Providers"

## 2023-03-08 DIAGNOSIS — M25562 Pain in left knee: Secondary | ICD-10-CM

## 2023-03-08 DIAGNOSIS — R262 Difficulty in walking, not elsewhere classified: Secondary | ICD-10-CM | POA: Diagnosis not present

## 2023-03-08 DIAGNOSIS — M6281 Muscle weakness (generalized): Secondary | ICD-10-CM | POA: Diagnosis not present

## 2023-03-08 DIAGNOSIS — G8929 Other chronic pain: Secondary | ICD-10-CM | POA: Diagnosis not present

## 2023-03-08 DIAGNOSIS — R600 Localized edema: Secondary | ICD-10-CM | POA: Diagnosis not present

## 2023-03-08 DIAGNOSIS — M25662 Stiffness of left knee, not elsewhere classified: Secondary | ICD-10-CM

## 2023-03-08 NOTE — Therapy (Signed)
 OUTPATIENT PHYSICAL THERAPY TREATMENT   Patient Name: Keith Watkins MRN: [id number] DOB:1945/10/05, 78 y.o., male Today's Date: 03/08/2023  END OF SESSION:  PT End of Session - 03/08/23 0945     Visit Number 7    Number of Visits 20    Date for PT Re-Evaluation 04/27/23    Authorization Type [employer] $10 copay    Progress Note Due on Visit 10    PT Start Time 0928    PT Stop Time 1018    PT Time Calculation (min) 50 min    Activity Tolerance Patient tolerated treatment well    Behavior During Therapy [facility] for tasks assessed/performed               Past Medical History:  Diagnosis Date   Arthritis    Back pain    Cataract    Cellulitis 07/31/2011   Complication of anesthesia    Pt wakes up too quickly   Enlarged prostate    Foot abscess, left 08/07/2011   Hypertension    prior to gastric bypass- no meds taken   Lumbar spinal stenosis    Obesity 08/09/2011   Past Surgical History:  Procedure Laterality Date   APPENDECTOMY  1963   CHOLECYSTECTOMY  1984   GASTRIC FUNDOPLICATION  1982   at [facility]   I & D EXTREMITY  08/07/2011   Procedure: IRRIGATION AND DEBRIDEMENT EXTREMITY;  Surgeon: Toni Arthurs, MD;  Location: [facility];  Service: Orthopedics;  Laterality: Left;  irrigation and debridement left foot abscess   KNEE ARTHROSCOPY Right    TOTAL KNEE ARTHROPLASTY Left 02/01/2023   Procedure: LEFT TOTAL KNEE ARTHROPLASTY;  Surgeon: Kathryne Hitch, MD;  Location: [facility] OR;  Service: Orthopedics;  Laterality: Left;   Patient Active Problem List   Diagnosis Date Noted   Status post total left knee replacement 02/01/2023   Bilateral lower extremity edema 06/21/2018   Cellulitis of left lower leg 08/21/2016   Eosinophilia 03/18/2014   Lumbar spinal stenosis    Benign prostatic hyperplasia (BPH) with urinary urge incontinence 08/09/2011    PCP: Donita Brooks, MD   REFERRING PROVIDER: Kathryne Hitch  REFERRING DIAG:  W10.272  Status post total left knee replacement  - Primary  M17.12 Primary osteoarthritis of left knee   THERAPY DIAG:  Chronic pain of left knee  Muscle weakness (generalized)  Stiffness of left knee, not elsewhere classified  Difficulty in walking, not elsewhere classified  Localized edema  Rationale for Evaluation and Treatment: Rehabilitation  ONSET DATE: Lt TKA 02/01/2023  SUBJECTIVE:   SUBJECTIVE STATEMENT: Pt indicated no specific pain troubles this morning.  Still noticing it at end range.  Pt indicated doing some walking in house with furniture help, SPC.  Pt indicated pain complaints when trying seated SLR with hip pain.   PERTINENT HISTORY: Lt TKA, Lumbar stenosis, History of Lt leg cellulitis, Lt foot wound (healed)   PAIN:  NPRS scale: pain at worst in last 4-5/10 in last 3 days Pain location: Around the L knee and some quad Pain description: Tightness and throbbing Aggravating factors: bending the knee  Relieving factors: medicine- Tylenol, ice   PRECAUTIONS: None  WEIGHT BEARING RESTRICTIONS: No  FALLS:  Has patient fallen in last 6 months? No  LIVING ENVIRONMENT: Lives with: lives with their family and currently living at daughters since she is able to be with him 24/7 Lives in: House/apartment, daughters house has stairs with a rail and his home has  a metal ramp Stairs: Yes: External: 4 steps; on right going up Has following equipment at home: Single point cane, Walker - 2 wheeled, Environmental consultant - 4 wheeled, and Ramped entry  OCCUPATION: retired  PLOF: Independent, ambulating with cane  PATIENT GOALS:  Reduce pain, walk better.    Next MD visit: 03/09/2023 Dr. Magnus Ivan, MD   OBJECTIVE:   DIAGNOSTIC FINDINGS: FINDINGS: Left knee arthroplasty in expected alignment. No periprosthetic lucency or fracture. There has been patellar resurfacing. Recent postsurgical change includes air and edema in the soft tissues and joint space. Anterior skin staples.    IMPRESSION: Left knee arthroplasty without immediate postoperative complication.    PATIENT SURVEYS:  Patient-specific activity scoring scheme   "0" represents "unable to perform." "10" represents "able to perform at prior level. 0 1 2 3 4 5 6 7 8 9  10 (Date and Score) Activity Initial  Activity 02/16/2022    Stand for long time 6     2. Walk distances 0     3. Climb stairs 0   4.    5.     Total score = sum of the activity scores/number of activities Minimum detectable change (90%CI) for average score = 2 points Minimum detectable change (90%CI) for single activity score = 3 points  Score: average 2/10   COGNITION: 02/17/2023 Overall cognitive status: WFL    SENSATION: 02/17/2023 Not tested  EDEMA:  02/17/2023 Circumferential: 27.25 in above knee 19.25 below knee  MUSCLE LENGTH: 02/17/2023 Not tested  POSTURE:  02/17/2023 No Significant postural limitations  PALPATION: 02/17/2023 Not tender to touch at calf or knee.   LOWER EXTREMITY ROM:   ROM Right 02/17/2023 Left 02/17/2023 Left  02/23/2023 Left  03/04/2023  Hip flexion      Hip extension      Hip abduction      Hip adduction      Hip internal rotation      Hip external rotation      Knee flexion  A:82 P: 87 A:88 P:95; due to pain 114  Knee extension  A: 2 P: 0 A: 0 0  Ankle dorsiflexion      Ankle plantarflexion      Ankle inversion      Ankle eversion       (Blank rows = not tested)  LOWER EXTREMITY MMT:  MMT Right 02/17/2023 Left 02/17/2023 Left   Hip flexion     Hip extension     Hip abduction     Hip adduction     Hip internal rotation     Hip external rotation     Knee flexion     Knee extension  3/5 observed due to able to complete full range against gravity   Ankle dorsiflexion     Ankle plantarflexion     Ankle inversion     Ankle eversion      (Blank rows = not tested)  FUNCTIONAL TESTS:  02/17/2023 Not tested  GAIT: 03/08/2023:  SPC use in clinic with good sequencing.    02/17/2023 Distance walked: clinic distances Assistive device utilized: Environmental consultant - 2 wheeled Level of assistance: Modified independence Comments: decreased step length, decreased stance time on L, decreased cadence  TODAY'S TREATMENT                                                                        DATE:  03/08/2023 Therex: Recumbent bike Lvl 3 for ROM 8 mins , seat 7 Incline gastroc stretch 3 x 30 secs with heels on ground Seated Lt leg LAQ with end range pauses both direction x 15   Education on adapting intervention to not increase pain complaints post activity (in regards to stretching).  Advised holding seated SLR due to severity of after symptoms.   Theract: (to improve progressive mobility, stairs, squatting, transfers, ambulation) Leg press double leg 100 lbs x 15, single leg 50 lbs 2 x 10 Lt leg  Step up forward WB on Lt leg with reverse step down 6 inch bilateral hand assist x 2(high difficulty), 4 inch step x 13 with Rt hand assist only.  SPC ambulation within clinic household distances < 150 performed between activity.  Performed to improve confidence in ambulation with SPC in house and in community.   Neuro: (improve balance/coordination, muscle activation) Tandem stance 1 min x 2 bilateral on foam in // bars with occasional HHA Supine Lt leg quad set 5 sec hold x 15   Vaso: Lt knee, , 34 deg, med pressure, BLE elevated in supine.  TODAY'S TREATMENT                                                                        DATE: 03/04/2023 Recumbent bike Seat 7 for 5 minutes full range from the start Supine quadriceps stretch with PT assist 5 x 20 seconds Quadriceps sets 10 x 5 seconds Seated straight leg raises 3 sets of 5 for 3 seconds  Functional Activities: Double Leg Press 100# 15 x slow  eccentrics  Single Leg Press 50# 10 x slow eccentrics Slow step-down off 4 inch step (slow eccentrics) 10 x  Neuromuscular re-education: High marching with only the right hand for balance; heel to toe dynamic balance forward and back using only the right hand for balance  Vaso left knee 10 minutes Medium 34*   TODAY'S TREATMENT                                                                        DATE2/26/2025 Recumbent bike Seat 7 for 5 minutes AAROM to full range Supine quadriceps stretch with PT assist 5 x 20 seconds Quadriceps sets 10 x 5 seconds  Functional Activities: Double Leg Press 87# 15 x slow eccentrics & 100# 10 x slow eccentrics Single Leg Press 43# 10 x slow eccentrics  Neuromuscular re-education: High marching with both hands and only the right hand; heel to toe dynamic balance using only the right  hand; step-up and over hurdles using only the right hand  Vaso left knee 10 minutes Medium 34*   TODAY'S TREATMENT                                                                        DATE2/19/2025 Therex: Nustep Lvl 4 8 mins UE/LE.  Incline gastroc stretch 4x30sec   Theract: Leg press 75 lbs x15 Step ups 4in anterior and lateral x15ea; BUE to 1UE use as pt progressed; vc for proper knee bend   Neuro: Tandem 2x30sec ea; intermittent UE use for balance recovery Side stepping with UE hover; intermittent UE use when posterior   Manual: Seated L knee flexion with over pressure and distraction with IR to patient tolerance.   Self-care: Patient educated on proper height of wooden cane and how he can modify it to be the correct height. Also educated on use of metal cane and its functions.  Educated on use of barstool for sitting while doing tasks in his garage, proper positioning for ease and safety of standing and sitting, proper height, and its use to relieve back pain caused by standing while also strengthening back musculature. Patient tried 29in and 27in on  corner of mat table demonstrating proper support of LE while remaining upright. Pt verbalized understanding including how to use to improve standing tolerance.   Vaso: L knee, , 34 deg, med pressure, BLE elevated in supine.    HOME EXERCISE PROGRAM: Access Code: [email or web address] URL: [email or web address] Date: 03/02/2023 Prepared by: Pauletta Browns  Exercises - Supine Heel Slide (Mirrored)  - 3-5 x daily - 7 x weekly - 1 sets - 10 reps - 5 hold - Supine Heel Slide with Strap  - 3-5 x daily - 7 x weekly - 1 sets - 10 reps - 5 hold - Seated Long Arc Quad (Mirrored)  - 3-5 x daily - 7 x weekly - 1 sets - 5-10 reps - 2 hold - Supine Knee Extension Mobilization with Weight (Mirrored)  - 4-5 x daily - 7 x weekly - 1 sets - 1 reps - to tolerance up to 15 mins hold - Seated Quad Set (Mirrored)  - 3-5 x daily - 7 x weekly - 1 sets - 10 reps - 5 hold - Supine Quadriceps Stretch with Strap on Table  - 2 x daily - 7 x weekly - 5 reps - 20 seconds hold  ASSESSMENT:  CLINICAL IMPRESSION: Education and details about DOMS related to seated SLR.  Held today and in future may return with reduced amount to build up tolerance.  Improving confidence and control with SPC use.  Continued strengthening and balance improvements important to improve progressive mobility including reciprocal gait navigation and other daily activity.   OBJECTIVE IMPAIRMENTS: Abnormal gait, decreased activity tolerance, decreased balance, decreased coordination, decreased endurance, decreased knowledge of use of DME, decreased mobility, difficulty walking, decreased ROM, decreased strength, increased edema, obesity, and pain.   ACTIVITY LIMITATIONS: carrying, lifting, bending, sitting, standing, squatting, stairs, bed mobility, and locomotion level  PARTICIPATION LIMITATIONS: meal prep, cleaning, laundry, interpersonal relationship, driving, shopping, community activity, occupation, and yard work  PERSONAL FACTORS: Age,  Fitness, Time since onset of injury/illness/exacerbation, and 3+ comorbidities: PMH includes BPH, lumbar spinal stenosis,  PVD, HTN  are also affecting patient's functional outcome.   REHAB POTENTIAL: Good  CLINICAL DECISION MAKING: Evolving/moderate complexity  EVALUATION COMPLEXITY: Moderate   GOALS: Goals reviewed with patient? Yes  SHORT TERM GOALS: (target date for Short term goals are 3 weeks 03/09/2023)   1.  Patient will demonstrate independent use of home exercise program to maintain progress from in clinic treatments.  Goal status: Met 03/02/2023  LONG TERM GOALS: (target dates for all long term goals are 10 weeks  04/27/2023 )   1. Patient will demonstrate/report pain at worst less than or equal to 2/10 to facilitate minimal limitation in daily activity secondary to pain symptoms.  Goal status: New   2. Patient will demonstrate independent use of home exercise program to facilitate ability to maintain/progress functional gains from skilled physical therapy services.  Goal status: New  3. Patient will demonstrate/report PSFS scoring at average > or = 8/10 scoring for improved functional activity performance.  Goal status: New   4.  Patient will demonstrate L LE MMT 5/5 throughout to faciltiate usual transfers, stairs, squatting at [facility] for daily life.   Goal status: New   5.  Patient will demonstrate or report ability to perform 18in chair transfer without UE assist. Goal status: New   6.  Patient will ambulate community distances with LRAD independently to decrease caregiver burden Goal status: New   7.  Patient will demonstrate 0-115 deg knee AROM to improve mobility and functional tasks. Goal Status: New  8. Patient will demonstrate ascending/descending stairs reciprocally s UE assist for community integration.   Goal status : new    PLAN:  PT FREQUENCY: 1-2x/week  PT DURATION: 10 weeks  PLANNED INTERVENTIONS: Can include 16109- PT Re-evaluation,  97110-Therapeutic exercises, 97530- Therapeutic activity, [id number]- Neuromuscular re-education, 97535- Self Care, 97140- Manual therapy, [id number]- Gait training, [id number]- Orthotic Fit/training, [phone]- Canalith repositioning, [id number]- Aquatic Therapy, 97014- Electrical stimulation (unattended), [id number]- Electrical stimulation (manual), [id number] Physical performance testing, 97016- Vasopneumatic device, [id number]- Ultrasound, [id number]- Traction (mechanical), [id number]- Ionotophoresis 4mg /ml Dexamethasone, Patient/Family education, Balance training, Stair training, Taping, Dry Needling, Joint mobilization, Joint manipulation, Spinal manipulation, Scar mobilization, Vestibular training, Visual/preceptual remediation/compensation, DME instructions, Cryotherapy, and Moist heat.  All performed as medically necessary.  All included unless contraindicated  PLAN FOR NEXT SESSION: SPC in clinic.  Revist small amount of seated SLR (be careful of DOMS)    Chyrel Masson, PT, DPT, OCS, ATC 03/08/23  10:05 AM

## 2023-03-09 ENCOUNTER — Ambulatory Visit (INDEPENDENT_AMBULATORY_CARE_PROVIDER_SITE_OTHER): Payer: Medicare HMO | Admitting: Orthopaedic Surgery

## 2023-03-09 DIAGNOSIS — Z96652 Presence of left artificial knee joint: Secondary | ICD-10-CM

## 2023-03-09 NOTE — Progress Notes (Signed)
 Keith Watkins comes in today close 6-week status post left total knee replacement.  He has been very pleased with the physical therapists upstairs here at [facility].  He said they have pushed him well and have given him good motivation for getting his left knee bending and moving.  I was able to review their notes and he does have good range of motion.  He is ambulate with a cane.  He is only taken Tylenol and a muscle relaxant before therapy sessions.  At 78 years old he still does work and he is working from home now.  He would like to be cleared to drive and consider returning to work in a few weeks if he is making good progress through his physical therapy sessions next week.  On my exam today his extension is full of his left knee.  He can flex past 90 degrees and he feels stable to me.  The incision looks good.  From my standpoint I will see him back in 4 weeks to see how he is doing overall.  I would like a standing AP and lateral of his left knee at that visit.  He will continue therapy and if they feel good about releasing him after next week I be fine with him having an home exercise program.  He can then let us know what he would like a work note to state in terms of when to return to work and for weight restrictions.

## 2023-03-10 ENCOUNTER — Encounter: Payer: Self-pay | Admitting: Rehabilitative and Restorative Service Providers"

## 2023-03-10 ENCOUNTER — Ambulatory Visit: Payer: Medicare HMO | Admitting: Rehabilitative and Restorative Service Providers"

## 2023-03-10 DIAGNOSIS — M6281 Muscle weakness (generalized): Secondary | ICD-10-CM

## 2023-03-10 DIAGNOSIS — R600 Localized edema: Secondary | ICD-10-CM

## 2023-03-10 DIAGNOSIS — M25562 Pain in left knee: Secondary | ICD-10-CM | POA: Diagnosis not present

## 2023-03-10 DIAGNOSIS — R262 Difficulty in walking, not elsewhere classified: Secondary | ICD-10-CM | POA: Diagnosis not present

## 2023-03-10 DIAGNOSIS — M25662 Stiffness of left knee, not elsewhere classified: Secondary | ICD-10-CM | POA: Diagnosis not present

## 2023-03-10 DIAGNOSIS — G8929 Other chronic pain: Secondary | ICD-10-CM

## 2023-03-10 NOTE — Therapy (Signed)
 OUTPATIENT PHYSICAL THERAPY TREATMENT   Patient Name: Keith Watkins MRN: [id number] DOB:04-Nov-1945, 78 y.o., male Today's Date: 03/10/2023  END OF SESSION:  PT End of Session - 03/10/23 1007     Visit Number 8    Number of Visits 20    Date for PT Re-Evaluation 04/27/23    Authorization Type [employer] $10 copay    Progress Note Due on Visit 10    PT Start Time 1015    PT Stop Time 1105    PT Time Calculation (min) 50 min    Activity Tolerance Patient tolerated treatment well    Behavior During Therapy [facility] for tasks assessed/performed                Past Medical History:  Diagnosis Date   Arthritis    Back pain    Cataract    Cellulitis 07/31/2011   Complication of anesthesia    Pt wakes up too quickly   Enlarged prostate    Foot abscess, left 08/07/2011   Hypertension    prior to gastric bypass- no meds taken   Lumbar spinal stenosis    Obesity 08/09/2011   Past Surgical History:  Procedure Laterality Date   APPENDECTOMY  1963   CHOLECYSTECTOMY  1984   GASTRIC FUNDOPLICATION  1982   at [facility]   I & D EXTREMITY  08/07/2011   Procedure: IRRIGATION AND DEBRIDEMENT EXTREMITY;  Surgeon: Toni Arthurs, MD;  Location: [facility];  Service: Orthopedics;  Laterality: Left;  irrigation and debridement left foot abscess   KNEE ARTHROSCOPY Right    TOTAL KNEE ARTHROPLASTY Left 02/01/2023   Procedure: LEFT TOTAL KNEE ARTHROPLASTY;  Surgeon: Kathryne Hitch, MD;  Location: [facility] OR;  Service: Orthopedics;  Laterality: Left;   Patient Active Problem List   Diagnosis Date Noted   Status post total left knee replacement 02/01/2023   Bilateral lower extremity edema 06/21/2018   Cellulitis of left lower leg 08/21/2016   Eosinophilia 03/18/2014   Lumbar spinal stenosis    Benign prostatic hyperplasia (BPH) with urinary urge incontinence 08/09/2011    PCP: Donita Brooks, MD   REFERRING PROVIDER: Kathryne Hitch  REFERRING DIAG:  U13.244  Status post total left knee replacement  - Primary  M17.12 Primary osteoarthritis of left knee   THERAPY DIAG:  Chronic pain of left knee  Muscle weakness (generalized)  Stiffness of left knee, not elsewhere classified  Difficulty in walking, not elsewhere classified  Localized edema  Rationale for Evaluation and Treatment: Rehabilitation  ONSET DATE: Lt TKA 02/01/2023  SUBJECTIVE:   SUBJECTIVE STATEMENT: Pt indicated a little tightness in calf this morning but nothing specific to knee.  Saw MD office and reported good visit.  Will plan to drive some this weekend.  Has a truck that may be hard to get in.   PERTINENT HISTORY: Lt TKA, Lumbar stenosis, History of Lt leg cellulitis, Lt foot wound (healed)   PAIN:  NPRS scale: pain at worst in last 4-5/10 in last 3 days Pain location: Around the L knee and some quad Pain description: Tightness and throbbing Aggravating factors: bending the knee  Relieving factors: medicine- Tylenol, ice   PRECAUTIONS: None  WEIGHT BEARING RESTRICTIONS: No  FALLS:  Has patient fallen in last 6 months? No  LIVING ENVIRONMENT: Lives with: lives with their family and currently living at daughters since she is able to be with him 24/7 Lives in: [place], daughters house has stairs with a rail and  his home has a metal ramp Stairs: Yes: External: 4 steps; on right going up Has following equipment at home: Single point cane, Walker - 2 wheeled, Environmental consultant - 4 wheeled, and Ramped entry  OCCUPATION: retired  PLOF: Independent, ambulating with cane  PATIENT GOALS:  Reduce pain, walk better.    Next MD visit: 03/09/2023 Dr. Magnus Ivan, MD   OBJECTIVE:   DIAGNOSTIC FINDINGS: FINDINGS: Left knee arthroplasty in expected alignment. No periprosthetic lucency or fracture. There has been patellar resurfacing. Recent postsurgical change includes air and edema in the soft tissues and joint space. Anterior skin staples.   IMPRESSION: Left knee  arthroplasty without immediate postoperative complication.    PATIENT SURVEYS:  Patient-specific activity scoring scheme   "0" represents "unable to perform." "10" represents "able to perform at prior level. 0 1 2 3 4 5 6 7 8 9  10 (Date and Score) Activity Initial  Activity 02/16/2022  03/10/2023  Stand for long time 6   10 for knee (back limited)  2. Walk distances 0  5 with cane   3. Climb stairs 0 1      Total 2/10 average 5.33 average   Total score = sum of the activity scores/number of activities Minimum detectable change (90%CI) for average score = 2 points Minimum detectable change (90%CI) for single activity score = 3 points  Score: average 2/10   COGNITION: 02/17/2023 Overall cognitive status: WFL    SENSATION: 02/17/2023 Not tested  EDEMA:  02/17/2023 Circumferential: 27.25 in above knee 19.25 below knee  MUSCLE LENGTH: 02/17/2023 Not tested  POSTURE:  02/17/2023 No Significant postural limitations  PALPATION: 02/17/2023 Not tender to touch at calf or knee.   LOWER EXTREMITY ROM:   ROM Right 02/17/2023 Left 02/17/2023 Left  02/23/2023 Left  03/04/2023  Hip flexion      Hip extension      Hip abduction      Hip adduction      Hip internal rotation      Hip external rotation      Knee flexion  A:82 P: 87 A:88 P:95; due to pain 114  Knee extension  A: 2 P: 0 A: 0 0  Ankle dorsiflexion      Ankle plantarflexion      Ankle inversion      Ankle eversion       (Blank rows = not tested)  LOWER EXTREMITY MMT:  MMT Right 02/17/2023 Left 02/17/2023 Left   Hip flexion     Hip extension     Hip abduction     Hip adduction     Hip internal rotation     Hip external rotation     Knee flexion     Knee extension  3/5 observed due to able to complete full range against gravity   Ankle dorsiflexion     Ankle plantarflexion     Ankle inversion     Ankle eversion      (Blank rows = not tested)  FUNCTIONAL TESTS:  02/17/2023 Not  tested  GAIT: 03/08/2023:  SPC use in clinic with good sequencing.   02/17/2023 Distance walked: clinic distances Assistive device utilized: Environmental consultant - 2 wheeled Level of assistance: Modified independence Comments: decreased step length, decreased stance time on L, decreased cadence  TODAY'S TREATMENT                                                                        DATE:  03/10/2023 Therex: UBE LE only for ROM 9 mins, seat 12 Seated quad set with SLR Lt x 15 (review of positioning for home, encouraged 1 set per day right now) Incline gastroc stretch 3 x 30 secs with heels on ground Seated Lt leg LAQ with end range pauses both direction x 15   Theract: (to improve progressive mobility, stairs, squatting, transfers, ambulation) Step up WB on Lt leg 4 inch step x 15 with single hand assist, lateral step up/down 4 inch step x 15 WB on Lt leg.  Sit to stand to sit 21.5 inch table    Neuro: (improve balance/coordination, muscle activation) SLS on black mat with contralateral leg tapping 4 corners x 5 each way with moderate HHA BIG inspired retro stepping in // bars x 15 bilaterally with occasional HHA on bar   Vaso: Lt knee, , 34 deg, med pressure, BLE elevated in supine.  TODAY'S TREATMENT                                                                        DATE:  03/08/2023 Therex: Recumbent bike Lvl 3 for ROM 8 mins , seat 7 Incline gastroc stretch 3 x 30 secs with heels on ground Seated Lt leg LAQ with end range pauses both direction x 15   Education on adapting intervention to not increase pain complaints post activity (in regards to stretching).  Advised holding seated SLR due to severity of after symptoms.   Theract: (to improve progressive mobility, stairs, squatting, transfers, ambulation) Leg press double  leg 100 lbs x 15, single leg 50 lbs 2 x 10 Lt leg  Step up forward WB on Lt leg with reverse step down 6 inch bilateral hand assist x 2(high difficulty), 4 inch step x 13 with Rt hand assist only.  SPC ambulation within clinic household distances < 150 performed between activity.  Performed to improve confidence in ambulation with SPC in house and in community.   Neuro: (improve balance/coordination, muscle activation) Tandem stance 1 min x 2 bilateral on foam in // bars with occasional HHA Supine Lt leg quad set 5 sec hold x 15   Vaso: Lt knee, , 34 deg, med pressure, BLE elevated in supine.  TODAY'S TREATMENT                                                                        DATE: 03/04/2023 Recumbent bike Seat 7 for 5 minutes full range from the start Supine quadriceps stretch with PT assist 5 x 20 seconds Quadriceps  sets 10 x 5 seconds Seated straight leg raises 3 sets of 5 for 3 seconds  Functional Activities: Double Leg Press 100# 15 x slow eccentrics  Single Leg Press 50# 10 x slow eccentrics Slow step-down off 4 inch step (slow eccentrics) 10 x  Neuromuscular re-education: High marching with only the right hand for balance; heel to toe dynamic balance forward and back using only the right hand for balance  Vaso left knee 10 minutes Medium 34*   TODAY'S TREATMENT                                                                        DATE2/26/2025 Recumbent bike Seat 7 for 5 minutes AAROM to full range Supine quadriceps stretch with PT assist 5 x 20 seconds Quadriceps sets 10 x 5 seconds  Functional Activities: Double Leg Press 87# 15 x slow eccentrics & 100# 10 x slow eccentrics Single Leg Press 43# 10 x slow eccentrics  Neuromuscular re-education: High marching with both hands and only the right hand; heel to toe dynamic balance using only the right hand; step-up and over hurdles using only the right hand  Vaso left knee 10 minutes Medium 34*  HOME EXERCISE  PROGRAM: Access Code: [email or web address] URL: [email or web address] Date: 03/02/2023 Prepared by: Pauletta Browns  Exercises - Supine Heel Slide (Mirrored)  - 3-5 x daily - 7 x weekly - 1 sets - 10 reps - 5 hold - Supine Heel Slide with Strap  - 3-5 x daily - 7 x weekly - 1 sets - 10 reps - 5 hold - Seated Long Arc Quad (Mirrored)  - 3-5 x daily - 7 x weekly - 1 sets - 5-10 reps - 2 hold - Supine Knee Extension Mobilization with Weight (Mirrored)  - 4-5 x daily - 7 x weekly - 1 sets - 1 reps - to tolerance up to 15 mins hold - Seated Quad Set (Mirrored)  - 3-5 x daily - 7 x weekly - 1 sets - 10 reps - 5 hold - Supine Quadriceps Stretch with Strap on Table  - 2 x daily - 7 x weekly - 5 reps - 20 seconds hold  ASSESSMENT:  CLINICAL IMPRESSION: Conitnued strengthening and balance improvements to help improve independent ambulation and community integration.  Continued skilled PT services indicated at this time.   OBJECTIVE IMPAIRMENTS: Abnormal gait, decreased activity tolerance, decreased balance, decreased coordination, decreased endurance, decreased knowledge of use of DME, decreased mobility, difficulty walking, decreased ROM, decreased strength, increased edema, obesity, and pain.   ACTIVITY LIMITATIONS: carrying, lifting, bending, sitting, standing, squatting, stairs, bed mobility, and locomotion level  PARTICIPATION LIMITATIONS: meal prep, cleaning, laundry, interpersonal relationship, driving, shopping, community activity, occupation, and yard work  PERSONAL FACTORS: Age, Fitness, Time since onset of injury/illness/exacerbation, and 3+ comorbidities: PMH includes BPH, lumbar spinal stenosis, PVD, HTN  are also affecting patient's functional outcome.   REHAB POTENTIAL: Good  CLINICAL DECISION MAKING: Evolving/moderate complexity  EVALUATION COMPLEXITY: Moderate   GOALS: Goals reviewed with patient? Yes  SHORT TERM GOALS: (target date for Short term goals are 3 weeks  03/09/2023)   1.  Patient will demonstrate independent use of home exercise program to maintain progress from in clinic treatments.  Goal status: Met  03/02/2023  LONG TERM GOALS: (target dates for all long term goals are 10 weeks  04/27/2023 )   1. Patient will demonstrate/report pain at worst less than or equal to 2/10 to facilitate minimal limitation in daily activity secondary to pain symptoms.  Goal status: on going 03/10/2023   2. Patient will demonstrate independent use of home exercise program to facilitate ability to maintain/progress functional gains from skilled physical therapy services.  Goal status: on going 03/10/2023  3. Patient will demonstrate/report PSFS scoring at average > or = 8/10 scoring for improved functional activity performance.  Goal status: on going 03/10/2023   4.  Patient will demonstrate L LE MMT 5/5 throughout to faciltiate usual transfers, stairs, squatting at [facility] for daily life.   Goal status: on going 03/10/2023   5.  Patient will demonstrate or report ability to perform 18in chair transfer without UE assist. Goal status: on going 03/10/2023   6.  Patient will ambulate community distances with LRAD independently to decrease caregiver burden Goal status: on going 03/10/2023   7.  Patient will demonstrate 0-115 deg knee AROM to improve mobility and functional tasks. Goal Status: on going 03/10/2023  8. Patient will demonstrate ascending/descending stairs reciprocally s UE assist for community integration.   Goal status : on going 03/10/2023    PLAN:  PT FREQUENCY: 1-2x/week  PT DURATION: 10 weeks  PLANNED INTERVENTIONS: Can include 16109- PT Re-evaluation, 97110-Therapeutic exercises, 97530- Therapeutic activity, 97112- Neuromuscular re-education, 97535- Self Care, 97140- Manual therapy, [id number]- Gait training, [phone]- Orthotic Fit/training, [id number]- Canalith repositioning, [id number]- Aquatic Therapy, 97014- Electrical stimulation (unattended), [id number]- Electrical  stimulation (manual), [id number] Physical performance testing, 97016- Vasopneumatic device, [id number]- Ultrasound, [id number]- Traction (mechanical), [id number]- Ionotophoresis 4mg /ml Dexamethasone, Patient/Family education, Balance training, Stair training, Taping, Dry Needling, Joint mobilization, Joint manipulation, Spinal manipulation, Scar mobilization, Vestibular training, Visual/preceptual remediation/compensation, DME instructions, Cryotherapy, and Moist heat.  All performed as medically necessary.  All included unless contraindicated  PLAN FOR NEXT SESSION: Continued strengthening/balance improvements.     Chyrel Masson, PT, DPT, OCS, ATC 03/10/23  10:51 AM

## 2023-03-15 ENCOUNTER — Ambulatory Visit: Payer: Medicare HMO | Admitting: Rehabilitative and Restorative Service Providers"

## 2023-03-15 ENCOUNTER — Encounter: Payer: Self-pay | Admitting: Rehabilitative and Restorative Service Providers"

## 2023-03-15 DIAGNOSIS — M25562 Pain in left knee: Secondary | ICD-10-CM

## 2023-03-15 DIAGNOSIS — M6281 Muscle weakness (generalized): Secondary | ICD-10-CM | POA: Diagnosis not present

## 2023-03-15 DIAGNOSIS — G8929 Other chronic pain: Secondary | ICD-10-CM

## 2023-03-15 DIAGNOSIS — R262 Difficulty in walking, not elsewhere classified: Secondary | ICD-10-CM

## 2023-03-15 DIAGNOSIS — R600 Localized edema: Secondary | ICD-10-CM

## 2023-03-15 DIAGNOSIS — M25662 Stiffness of left knee, not elsewhere classified: Secondary | ICD-10-CM

## 2023-03-15 NOTE — Therapy (Addendum)
 OUTPATIENT PHYSICAL THERAPY TREATMENT   Patient Name: Keith Watkins MRN: [id number] DOB:1945-09-04, 78 y.o., male Today's Date: 03/15/2023  END OF SESSION:  PT End of Session - 03/15/23 0920     Visit Number 9    Number of Visits 20    Date for PT Re-Evaluation 04/27/23    Authorization Type [employer] $10 copay    Progress Note Due on Visit 10    PT Start Time 0925    PT Stop Time 1015    PT Time Calculation (min) 50 min    Activity Tolerance Patient tolerated treatment well    Behavior During Therapy [facility] for tasks assessed/performed                 Past Medical History:  Diagnosis Date   Arthritis    Back pain    Cataract    Cellulitis 07/31/2011   Complication of anesthesia    Pt wakes up too quickly   Enlarged prostate    Foot abscess, left 08/07/2011   Hypertension    prior to gastric bypass- no meds taken   Lumbar spinal stenosis    Obesity 08/09/2011   Past Surgical History:  Procedure Laterality Date   APPENDECTOMY  1963   CHOLECYSTECTOMY  1984   GASTRIC FUNDOPLICATION  1982   at [facility]   I & D EXTREMITY  08/07/2011   Procedure: IRRIGATION AND DEBRIDEMENT EXTREMITY;  Surgeon: Toni Arthurs, MD;  Location: [facility];  Service: Orthopedics;  Laterality: Left;  irrigation and debridement left foot abscess   KNEE ARTHROSCOPY Right    TOTAL KNEE ARTHROPLASTY Left 02/01/2023   Procedure: LEFT TOTAL KNEE ARTHROPLASTY;  Surgeon: Kathryne Hitch, MD;  Location: [facility] OR;  Service: Orthopedics;  Laterality: Left;   Patient Active Problem List   Diagnosis Date Noted   Status post total left knee replacement 02/01/2023   Bilateral lower extremity edema 06/21/2018   Cellulitis of left lower leg 08/21/2016   Eosinophilia 03/18/2014   Lumbar spinal stenosis    Benign prostatic hyperplasia (BPH) with urinary urge incontinence 08/09/2011    PCP: Donita Brooks, MD   REFERRING PROVIDER: Kathryne Hitch  REFERRING DIAG:  N82.956  Status post total left knee replacement  - Primary  M17.12 Primary osteoarthritis of left knee   THERAPY DIAG:  Chronic pain of left knee  Muscle weakness (generalized)  Stiffness of left knee, not elsewhere classified  Difficulty in walking, not elsewhere classified  Localized edema  Rationale for Evaluation and Treatment: Rehabilitation  ONSET DATE: Lt TKA 02/01/2023  SUBJECTIVE:   SUBJECTIVE STATEMENT: Pt indicated no specific pain number today upon arrival on Lt.  Reported Lt had improved so Rt is more noted.  Has started driving.   PERTINENT HISTORY: Lt TKA, Lumbar stenosis, History of Lt leg cellulitis, Lt foot wound (healed)   PAIN:  NPRS scale: no specific pain upon arrival.  Pain location: Lt knee Pain description: Tightness Aggravating factors: bending the knee  Relieving factors: medicine- Tylenol, ice   PRECAUTIONS: None  WEIGHT BEARING RESTRICTIONS: No  FALLS:  Has patient fallen in last 6 months? No  LIVING ENVIRONMENT: Lives with: lives with their family and currently living at daughters since she is able to be with him 24/7 Lives in: House/apartment, daughters house has stairs with a rail and his home has a metal ramp Stairs: Yes: External: 4 steps; on right going up Has following equipment at home: Single point cane, Walker - 2  wheeled, Environmental consultant - 4 wheeled, and Ramped entry  OCCUPATION: retired  PLOF: Independent, ambulating with cane  PATIENT GOALS:  Reduce pain, walk better.    Next MD visit: 03/09/2023 Dr. Magnus Ivan, MD   OBJECTIVE:   DIAGNOSTIC FINDINGS: FINDINGS: Left knee arthroplasty in expected alignment. No periprosthetic lucency or fracture. There has been patellar resurfacing. Recent postsurgical change includes air and edema in the soft tissues and joint space. Anterior skin staples.   IMPRESSION: Left knee arthroplasty without immediate postoperative complication.    PATIENT SURVEYS:  Patient-specific activity scoring  scheme   "0" represents "unable to perform." "10" represents "able to perform at prior level. 0 1 2 3 4 5 6 7 8 9  10 (Date and Score) Activity Initial  Activity 02/16/2022  03/10/2023  Stand for long time 6   10 for knee (back limited)  2. Walk distances 0  5 with cane   3. Climb stairs 0 1      Total 2/10 average 5.33 average   Total score = sum of the activity scores/number of activities Minimum detectable change (90%CI) for average score = 2 points Minimum detectable change (90%CI) for single activity score = 3 points  Score: average 2/10   COGNITION: 02/17/2023 Overall cognitive status: WFL    SENSATION: 02/17/2023 Not tested  EDEMA:  02/17/2023 Circumferential: 27.25 in above knee 19.25 below knee  MUSCLE LENGTH: 02/17/2023 Not tested  POSTURE:  02/17/2023 No Significant postural limitations  PALPATION: 02/17/2023 Not tender to touch at calf or knee.   LOWER EXTREMITY ROM:   ROM Right 02/17/2023 Left 02/17/2023 Left  02/23/2023 Left  03/04/2023  Hip flexion      Hip extension      Hip abduction      Hip adduction      Hip internal rotation      Hip external rotation      Knee flexion  A:82 P: 87 A:88 P:95; due to pain 114  Knee extension  A: 2 P: 0 A: 0 0  Ankle dorsiflexion      Ankle plantarflexion      Ankle inversion      Ankle eversion       (Blank rows = not tested)  LOWER EXTREMITY MMT:  MMT Right 02/17/2023 Left 02/17/2023 Left   Hip flexion     Hip extension     Hip abduction     Hip adduction     Hip internal rotation     Hip external rotation     Knee flexion     Knee extension  3/5 observed due to able to complete full range against gravity   Ankle dorsiflexion     Ankle plantarflexion     Ankle inversion     Ankle eversion      (Blank rows = not tested)  FUNCTIONAL TESTS:  02/17/2023 Not tested  GAIT: 03/08/2023:  SPC use in clinic with good sequencing.   02/17/2023 Distance walked: clinic distances Assistive device  utilized: Environmental consultant - 2 wheeled Level of assistance: Modified independence Comments: decreased step length, decreased stance time on L, decreased cadence  TODAY'S TREATMENT                                                                        DATE:  03/15/2023 Therex: UBE LE only for ROM 6 mins, seat 12 Standing lumbar extension AROM x 5 to tolerance .  Cues for home use throughout day as needed.   Theract: (to improve progressive mobility, stairs, squatting, transfers, ambulation) Leg press double leg 106 lbs x 20 slow lowering focus.  Lt leg 50 lbs 2 x 15   Neuro: (improve balance/coordination, muscle activation) SLS on black mat with contralateral leg tapping 4 corners x 6 each way with moderate HHA, performed bilaterally  with mirror feedback Tandem stance 1 min x 1 bilateral on floor, 1 min x 1 bilateral on foam in // bars with occasional HHA with mirror feedback Tandem ambulation on foam 6 ft in // bars fwd/back 6 x each way with occasional to moderate HHA on bars with mirror feedback Instruction on cross friction massage to incision to decrease sensitivity and promote improved tissue mobility. Tactile cues given.    Vaso: Lt knee, , 34 deg, med pressure, BLE elevated in supine.  TODAY'S TREATMENT                                                                        DATE:  03/10/2023 Therex: UBE LE only for ROM 9 mins, seat 12 Seated quad set with SLR Lt x 15 (review of positioning for home, encouraged 1 set per day right now) Incline gastroc stretch 3 x 30 secs with heels on ground Seated Lt leg LAQ with end range pauses both direction x 15   Theract: (to improve progressive mobility, stairs, squatting, transfers, ambulation) Step up WB on Lt leg 4 inch step x 15 with single hand assist, lateral step up/down 4  inch step x 15 WB on Lt leg.  Sit to stand to sit 21.5 inch table    Neuro: (improve balance/coordination, muscle activation) SLS on black mat with contralateral leg tapping 4 corners x 5 each way with moderate HHA BIG inspired retro stepping in // bars x 15 bilaterally with occasional HHA on bar   Vaso: Lt knee, , 34 deg, med pressure, BLE elevated in supine.  TODAY'S TREATMENT                                                                        DATE:  03/08/2023 Therex: Recumbent bike Lvl 3 for ROM 8 mins , seat 7 Incline gastroc stretch 3 x 30 secs with heels on ground Seated Lt leg LAQ with end range pauses both direction x 15   Education on adapting intervention to not increase pain  complaints post activity (in regards to stretching).  Advised holding seated SLR due to severity of after symptoms.   Theract: (to improve progressive mobility, stairs, squatting, transfers, ambulation) Leg press double leg 100 lbs x 15, single leg 50 lbs 2 x 10 Lt leg  Step up forward WB on Lt leg with reverse step down 6 inch bilateral hand assist x 2(high difficulty), 4 inch step x 13 with Rt hand assist only.  SPC ambulation within clinic household distances < 150 performed between activity.  Performed to improve confidence in ambulation with SPC in house and in community.   Neuro: (improve balance/coordination, muscle activation) Tandem stance 1 min x 2 bilateral on foam in // bars with occasional HHA Supine Lt leg quad set 5 sec hold x 15   Vaso: Lt knee, , 34 deg, med pressure, BLE elevated in supine.  HOME EXERCISE PROGRAM: Access Code: [email or web address] URL: [email or web address] Date: 03/02/2023 Prepared by: Pauletta Browns  Exercises - Supine Heel Slide (Mirrored)  - 3-5 x daily - 7 x weekly - 1 sets - 10 reps - 5 hold - Supine Heel Slide with Strap  - 3-5 x daily - 7 x weekly - 1 sets - 10 reps - 5 hold - Seated Long Arc Quad (Mirrored)  - 3-5 x daily - 7 x weekly - 1  sets - 5-10 reps - 2 hold - Supine Knee Extension Mobilization with Weight (Mirrored)  - 4-5 x daily - 7 x weekly - 1 sets - 1 reps - to tolerance up to 15 mins hold - Seated Quad Set (Mirrored)  - 3-5 x daily - 7 x weekly - 1 sets - 10 reps - 5 hold - Supine Quadriceps Stretch with Strap on Table  - 2 x daily - 7 x weekly - 5 reps - 20 seconds hold  ASSESSMENT:  CLINICAL IMPRESSION: Pt to continue to benefit from balance improvements and functional WB strength improvement to help improve stability in ambulation and ultimately independent ambulation.  Discussed lumbar extension to help address prolonged standing back complaints.   OBJECTIVE IMPAIRMENTS: Abnormal gait, decreased activity tolerance, decreased balance, decreased coordination, decreased endurance, decreased knowledge of use of DME, decreased mobility, difficulty walking, decreased ROM, decreased strength, increased edema, obesity, and pain.   ACTIVITY LIMITATIONS: carrying, lifting, bending, sitting, standing, squatting, stairs, bed mobility, and locomotion level  PARTICIPATION LIMITATIONS: meal prep, cleaning, laundry, interpersonal relationship, driving, shopping, community activity, occupation, and yard work  PERSONAL FACTORS: Age, Fitness, Time since onset of injury/illness/exacerbation, and 3+ comorbidities: PMH includes BPH, lumbar spinal stenosis, PVD, HTN  are also affecting patient's functional outcome.   REHAB POTENTIAL: Good  CLINICAL DECISION MAKING: Evolving/moderate complexity  EVALUATION COMPLEXITY: Moderate   GOALS: Goals reviewed with patient? Yes  SHORT TERM GOALS: (target date for Short term goals are 3 weeks 03/09/2023)   1.  Patient will demonstrate independent use of home exercise program to maintain progress from in clinic treatments.  Goal status: Met 03/02/2023  LONG TERM GOALS: (target dates for all long term goals are 10 weeks  04/27/2023 )   1. Patient will demonstrate/report pain at worst less  than or equal to 2/10 to facilitate minimal limitation in daily activity secondary to pain symptoms.  Goal status: on going 03/10/2023   2. Patient will demonstrate independent use of home exercise program to facilitate ability to maintain/progress functional gains from skilled physical therapy services.  Goal status: on going 03/10/2023  3. Patient will demonstrate/report PSFS scoring at average >  or = 8/10 scoring for improved functional activity performance.  Goal status: on going 03/10/2023   4.  Patient will demonstrate Lt LE MMT 5/5 throughout to faciltiate usual transfers, stairs, squatting at [facility] for daily life.   Goal status: on going 03/10/2023   5.  Patient will demonstrate or report ability to perform 18in chair transfer without UE assist. Goal status: on going 03/10/2023   6.  Patient will ambulate community distances with LRAD independently to decrease caregiver burden Goal status: on going 03/10/2023   7.  Patient will demonstrate 0-115 deg knee AROM to improve mobility and functional tasks. Goal Status: on going 03/10/2023  8. Patient will demonstrate ascending/descending stairs reciprocally s UE assist for community integration.   Goal status : on going 03/10/2023    PLAN:  PT FREQUENCY: 1-2x/week  PT DURATION: 10 weeks  PLANNED INTERVENTIONS: Can include 13086- PT Re-evaluation, 97110-Therapeutic exercises, 97530- Therapeutic activity, 97112- Neuromuscular re-education, 97535- Self Care, 97140- Manual therapy, [phone]- Gait training, [id number]- Orthotic Fit/training, [phone]- Canalith repositioning, [id number]- Aquatic Therapy, 97014- Electrical stimulation (unattended), [id number]- Electrical stimulation (manual), [id number] Physical performance testing, 97016- Vasopneumatic device, [id number]- Ultrasound, [id number]- Traction (mechanical), [id number]- Ionotophoresis 4mg /ml Dexamethasone, Patient/Family education, Balance training, Stair training, Taping, Dry Needling, Joint mobilization, Joint manipulation,  Spinal manipulation, Scar mobilization, Vestibular training, Visual/preceptual remediation/compensation, DME instructions, Cryotherapy, and Moist heat.  All performed as medically necessary.  All included unless contraindicated  PLAN FOR NEXT SESSION: Compliant surface balance, SLS improvements.    Chyrel Masson, PT, DPT, OCS, ATC 03/15/23  10:08 AM

## 2023-03-17 ENCOUNTER — Ambulatory Visit: Payer: Medicare HMO | Admitting: Rehabilitative and Restorative Service Providers"

## 2023-03-17 ENCOUNTER — Encounter: Payer: Self-pay | Admitting: Rehabilitative and Restorative Service Providers"

## 2023-03-17 DIAGNOSIS — M6281 Muscle weakness (generalized): Secondary | ICD-10-CM | POA: Diagnosis not present

## 2023-03-17 DIAGNOSIS — M25662 Stiffness of left knee, not elsewhere classified: Secondary | ICD-10-CM

## 2023-03-17 DIAGNOSIS — R600 Localized edema: Secondary | ICD-10-CM | POA: Diagnosis not present

## 2023-03-17 DIAGNOSIS — G8929 Other chronic pain: Secondary | ICD-10-CM

## 2023-03-17 DIAGNOSIS — M25562 Pain in left knee: Secondary | ICD-10-CM | POA: Diagnosis not present

## 2023-03-17 DIAGNOSIS — R262 Difficulty in walking, not elsewhere classified: Secondary | ICD-10-CM

## 2023-03-17 NOTE — Therapy (Addendum)
 OUTPATIENT PHYSICAL THERAPY TREATMENT /PROGRESS NOTE   Patient Name: Keith Watkins MRN: [id number] DOB:1945-08-27, 78 y.o., male Today's Date: 03/17/2023  Progress Note Reporting Period 02/16/2023 to 03/17/2023  See note below for Objective Data and Assessment of Progress/Goals.    END OF SESSION:  PT End of Session - 03/17/23 1015     Visit Number 10    Number of Visits 20    Date for PT Re-Evaluation 04/27/23    Authorization Type [employer] $10 copay    Progress Note Due on Visit 20    PT Start Time 1015    PT Stop Time 1108    PT Time Calculation (min) 53 min    Activity Tolerance Patient tolerated treatment well;Patient limited by pain   back pain   Behavior During Therapy [facility] for tasks assessed/performed             Past Medical History:  Diagnosis Date   Arthritis    Back pain    Cataract    Cellulitis 07/31/2011   Complication of anesthesia    Pt wakes up too quickly   Enlarged prostate    Foot abscess, left 08/07/2011   Hypertension    prior to gastric bypass- no meds taken   Lumbar spinal stenosis    Obesity 08/09/2011   Past Surgical History:  Procedure Laterality Date   APPENDECTOMY  1963   CHOLECYSTECTOMY  1984   GASTRIC FUNDOPLICATION  1982   at [facility]   I & D EXTREMITY  08/07/2011   Procedure: IRRIGATION AND DEBRIDEMENT EXTREMITY;  Surgeon: Toni Arthurs, MD;  Location: [facility];  Service: Orthopedics;  Laterality: Left;  irrigation and debridement left foot abscess   KNEE ARTHROSCOPY Right    TOTAL KNEE ARTHROPLASTY Left 02/01/2023   Procedure: LEFT TOTAL KNEE ARTHROPLASTY;  Surgeon: Kathryne Hitch, MD;  Location: [facility] OR;  Service: Orthopedics;  Laterality: Left;   Patient Active Problem List   Diagnosis Date Noted   Status post total left knee replacement 02/01/2023   Bilateral lower extremity edema 06/21/2018   Cellulitis of left lower leg 08/21/2016   Eosinophilia 03/18/2014   Lumbar spinal stenosis    Benign  prostatic hyperplasia (BPH) with urinary urge incontinence 08/09/2011    PCP: Donita Brooks, MD   REFERRING PROVIDER: Kathryne Hitch  REFERRING DIAG:  U13.244 Status post total left knee replacement  - Primary  M17.12 Primary osteoarthritis of left knee   THERAPY DIAG:  Chronic pain of left knee  Muscle weakness (generalized)  Stiffness of left knee, not elsewhere classified  Difficulty in walking, not elsewhere classified  Localized edema  Rationale for Evaluation and Treatment: Rehabilitation  ONSET DATE: Lt TKA 02/01/2023  SUBJECTIVE:   SUBJECTIVE STATEMENT: States that he is looking forward to going to the gym to use the bike there. Endorses knee stiffness in the morning but once he gets moving he is ok. The R knee is painful today patient suspects that it is due to him overusing it since the L side had the surgery.  PERTINENT HISTORY: Lt TKA, Lumbar stenosis, History of Lt leg cellulitis, Lt foot wound (healed)   PAIN:  NPRS scale: no specific pain upon arrival.  Pain location: Lt knee Pain description: Tightness Aggravating factors: bending the knee  Relieving factors: medicine- Tylenol, ice   PRECAUTIONS: None  WEIGHT BEARING RESTRICTIONS: No  FALLS:  Has patient fallen in last 6 months? No  LIVING ENVIRONMENT: Lives with: lives with their  family and currently living at daughters since she is able to be with him 24/7 Lives in: House/apartment, daughters house has stairs with a rail and his home has a metal ramp Stairs: Yes: External: 4 steps; on right going up Has following equipment at home: Single point cane, Walker - 2 wheeled, Environmental consultant - 4 wheeled, and Ramped entry  OCCUPATION: retired  PLOF: Independent, ambulating with cane  PATIENT GOALS:  Reduce pain, walk better.    Next MD visit: 03/09/2023 Dr. Magnus Ivan, MD   OBJECTIVE:   DIAGNOSTIC FINDINGS: FINDINGS: Left knee arthroplasty in expected alignment. No periprosthetic lucency  or fracture. There has been patellar resurfacing. Recent postsurgical change includes air and edema in the soft tissues and joint space. Anterior skin staples.   IMPRESSION: Left knee arthroplasty without immediate postoperative complication.    PATIENT SURVEYS:  Patient-specific activity scoring scheme   "0" represents "unable to perform." "10" represents "able to perform at prior level. 0 1 2 3 4 5 6 7 8 9  10 (Date and Score) Activity Initial  Activity 02/16/2022  03/10/2023  Stand for long time 6   10 for knee (back limited)  2. Walk distances 0  5 with cane   3. Climb stairs 0 1      Total 2/10 average 5.33 average   Total score = sum of the activity scores/number of activities Minimum detectable change (90%CI) for average score = 2 points Minimum detectable change (90%CI) for single activity score = 3 points  Score: average 2/10   COGNITION: 02/17/2023 Overall cognitive status: WFL    SENSATION: 02/17/2023 Not tested  EDEMA:  02/17/2023 Circumferential: 27.25 in above knee 19.25 below knee  MUSCLE LENGTH: 02/17/2023 Not tested  POSTURE:  02/17/2023 No Significant postural limitations  PALPATION: 02/17/2023 Not tender to touch at calf or knee.   LOWER EXTREMITY ROM:   ROM Right 02/17/2023 Left 02/17/2023 Left  02/23/2023 Left  03/04/2023  Hip flexion      Hip extension      Hip abduction      Hip adduction      Hip internal rotation      Hip external rotation      Knee flexion  A:82 P: 87 A:88 P:95; due to pain 114  Knee extension  A: 2 P: 0 A: 0 0  Ankle dorsiflexion      Ankle plantarflexion      Ankle inversion      Ankle eversion       (Blank rows = not tested)  LOWER EXTREMITY MMT:  MMT Right 02/17/2023 Left 02/17/2023  Hip flexion    Hip extension    Hip abduction    Hip adduction    Hip internal rotation    Hip external rotation    Knee flexion    Knee extension  3/5 observed due to able to complete full range against gravity   Ankle dorsiflexion    Ankle plantarflexion    Ankle inversion    Ankle eversion     (Blank rows = not tested)  FUNCTIONAL TESTS:  02/17/2023 Not tested  GAIT: 03/17/2023: SPC use with intermittent ambulation carrying SPC, good sequencing and weight acceptance to LLE  03/08/2023:  SPC use in clinic with good sequencing.   02/17/2023 Distance walked: clinic distances Assistive device utilized: Environmental consultant - 2 wheeled Level of assistance: Modified independence Comments: decreased step length, decreased stance time on L, decreased cadence  TODAY'S TREATMENT                                                                        DATE:  03/17/2023 Therex: Precore bike, 8 mins full revolutions, seat 8 Standing lumbar extension AROM throughout session as back pain increased  Theract: Leg press BLE 112# x15 slow eccentric. LLE 56# 2x15  Neuro: Heel toe rocking on airex 2x20; UE hold with intermittent hovering over railing //bar side stepping on foam beam with stepping on/off and ends x3 lengths; UE assist increasing as fatigue set in, required long seated rest break after activity Black mat with contralateral leg tapping 4 corners x 6 each way with UE assist, performed bilaterally with mirror feedback; standing rest breaks between with back extensions.   Vaso: , to Lt knee, 34 deg, med pressure, BLE elevated.  TODAY'S TREATMENT                                                                        DATE:  03/15/2023 Therex: UBE LE only for ROM 6 mins, seat 12 Standing lumbar extension AROM x 5 to tolerance .  Cues for home use throughout day as needed.   Theract: (to improve progressive mobility, stairs, squatting, transfers, ambulation) Leg press double leg 106 lbs x 20 slow lowering focus.  Lt leg 50 lbs 2 x 15   Neuro:  (improve balance/coordination, muscle activation) SLS on black mat with contralateral leg tapping 4 corners x 6 each way with moderate HHA, performed bilaterally  with mirror feedback Tandem stance 1 min x 1 bilateral on floor, 1 min x 1 bilateral on foam in // bars with occasional HHA with mirror feedback Tandem ambulation on foam 6 ft in // bars fwd/back 6 x each way with occasional to moderate HHA on bars with mirror feedback Instruction on cross friction massage to incision to decrease sensitivity and promote improved tissue mobility. Tactile cues given.    Vaso: Lt knee, , 34 deg, med pressure, BLE elevated in supine.  TODAY'S TREATMENT                                                                        DATE:  03/10/2023 Therex: UBE LE only for ROM 9 mins, seat 12 Seated quad set with SLR Lt x 15 (review of positioning for home, encouraged 1 set per day right now) Incline gastroc stretch 3 x 30 secs with heels on ground Seated Lt leg LAQ with end range pauses both direction x 15   Theract: (to improve progressive mobility, stairs, squatting, transfers, ambulation) Step up WB on Lt leg 4 inch step x 15 with single hand assist, lateral step up/down 4 inch step x  15 WB on Lt leg.  Sit to stand to sit 21.5 inch table    Neuro: (improve balance/coordination, muscle activation) SLS on black mat with contralateral leg tapping 4 corners x 5 each way with moderate HHA BIG inspired retro stepping in // bars x 15 bilaterally with occasional HHA on bar   Vaso: Lt knee, , 34 deg, med pressure, BLE elevated in supine.  TODAY'S TREATMENT                                                                        DATE:  03/08/2023 Therex: Recumbent bike Lvl 3 for ROM 8 mins , seat 7 Incline gastroc stretch 3 x 30 secs with heels on ground Seated Lt leg LAQ with end range pauses both direction x 15   Education on adapting intervention to not increase pain complaints post activity (in  regards to stretching).  Advised holding seated SLR due to severity of after symptoms.   Theract: (to improve progressive mobility, stairs, squatting, transfers, ambulation) Leg press double leg 100 lbs x 15, single leg 50 lbs 2 x 10 Lt leg  Step up forward WB on Lt leg with reverse step down 6 inch bilateral hand assist x 2(high difficulty), 4 inch step x 13 with Rt hand assist only.  SPC ambulation within clinic household distances < 150 performed between activity.  Performed to improve confidence in ambulation with SPC in house and in community.   Neuro: (improve balance/coordination, muscle activation) Tandem stance 1 min x 2 bilateral on foam in // bars with occasional HHA Supine Lt leg quad set 5 sec hold x 15   Vaso: Lt knee, , 34 deg, med pressure, BLE elevated in supine.  HOME EXERCISE PROGRAM: Access Code: [email or web address] URL: [email or web address] Date: 03/02/2023 Prepared by: Pauletta Browns  Exercises - Supine Heel Slide (Mirrored)  - 3-5 x daily - 7 x weekly - 1 sets - 10 reps - 5 hold - Supine Heel Slide with Strap  - 3-5 x daily - 7 x weekly - 1 sets - 10 reps - 5 hold - Seated Long Arc Quad (Mirrored)  - 3-5 x daily - 7 x weekly - 1 sets - 5-10 reps - 2 hold - Supine Knee Extension Mobilization with Weight (Mirrored)  - 4-5 x daily - 7 x weekly - 1 sets - 1 reps - to tolerance up to 15 mins hold - Seated Quad Set (Mirrored)  - 3-5 x daily - 7 x weekly - 1 sets - 10 reps - 5 hold - Supine Quadriceps Stretch with Strap on Table  - 2 x daily - 7 x weekly - 5 reps - 20 seconds hold  ASSESSMENT:  CLINICAL IMPRESSION: The patient has attended 10 visits over the course of treatment cycle.  See objective data above for most recent updated information regarding current presentation. Patient back pain limited today's session as he required breaks to do back extensions as well as a seated rest break. Patient does like to push through pain and requires therapist  intervention and insistence on taking seated breaks. Patient continues to make progress with strength as he was able to increase leg press weight. Balance deficits remain. Patient will benefit from continued skilled physical  therapy to address deficits in strength, ROM, and balance.  OBJECTIVE IMPAIRMENTS: Abnormal gait, decreased activity tolerance, decreased balance, decreased coordination, decreased endurance, decreased knowledge of use of DME, decreased mobility, difficulty walking, decreased ROM, decreased strength, increased edema, obesity, and pain.   ACTIVITY LIMITATIONS: carrying, lifting, bending, sitting, standing, squatting, stairs, bed mobility, and locomotion level  PARTICIPATION LIMITATIONS: meal prep, cleaning, laundry, interpersonal relationship, driving, shopping, community activity, occupation, and yard work  PERSONAL FACTORS: Age, Fitness, Time since onset of injury/illness/exacerbation, and 3+ comorbidities: PMH includes BPH, lumbar spinal stenosis, PVD, HTN  are also affecting patient's functional outcome.   REHAB POTENTIAL: Good  CLINICAL DECISION MAKING: Evolving/moderate complexity  EVALUATION COMPLEXITY: Moderate   GOALS: Goals reviewed with patient? Yes  SHORT TERM GOALS: (target date for Short term goals are 3 weeks 03/09/2023)   1.  Patient will demonstrate independent use of home exercise program to maintain progress from in clinic treatments.  Goal status: Met 03/02/2023  LONG TERM GOALS: (target dates for all long term goals are 10 weeks  04/27/2023 )   1. Patient will demonstrate/report pain at worst less than or equal to 2/10 to facilitate minimal limitation in daily activity secondary to pain symptoms.  Goal status: on going 03/10/2023   2. Patient will demonstrate independent use of home exercise program to facilitate ability to maintain/progress functional gains from skilled physical therapy services.  Goal status: on going 03/10/2023  3. Patient will  demonstrate/report PSFS scoring at average > or = 8/10 scoring for improved functional activity performance.  Goal status: on going 03/10/2023   4.  Patient will demonstrate Lt LE MMT 5/5 throughout to faciltiate usual transfers, stairs, squatting at [facility] for daily life.   Goal status: on going 03/10/2023   5.  Patient will demonstrate or report ability to perform 18in chair transfer without UE assist. Goal status: on going 03/10/2023   6.  Patient will ambulate community distances with LRAD independently to decrease caregiver burden Goal status: on going 03/10/2023   7.  Patient will demonstrate 0-115 deg knee AROM to improve mobility and functional tasks. Goal Status: on going 03/10/2023  8. Patient will demonstrate ascending/descending stairs reciprocally s UE assist for community integration.   Goal status : on going 03/10/2023    PLAN:  PT FREQUENCY: 1-2x/week  PT DURATION: 10 weeks  PLANNED INTERVENTIONS: Can include 62130- PT Re-evaluation, 97110-Therapeutic exercises, 97530- Therapeutic activity, 97112- Neuromuscular re-education, 97535- Self Care, 97140- Manual therapy, [phone]- Gait training, [phone]- Orthotic Fit/training, [phone]- Canalith repositioning, [id number]- Aquatic Therapy, 97014- Electrical stimulation (unattended), [id number]- Electrical stimulation (manual), [id number] Physical performance testing, 97016- Vasopneumatic device, [id number]- Ultrasound, [id number]- Traction (mechanical), [id number]- Ionotophoresis 4mg /ml Dexamethasone, Patient/Family education, Balance training, Stair training, Taping, Dry Needling, Joint mobilization, Joint manipulation, Spinal manipulation, Scar mobilization, Vestibular training, Visual/preceptual remediation/compensation, DME instructions, Cryotherapy, and Moist heat.  All performed as medically necessary.  All included unless contraindicated  PLAN FOR NEXT SESSION: continue balance activities, activities targeting SLS, education on activity tolerance/modification for back  issues.    Britanie Harshman, Mckinsey Keagle, Student-PT 03/17/2023, 3:44 PM

## 2023-03-24 ENCOUNTER — Other Ambulatory Visit: Payer: Self-pay | Admitting: Family Medicine

## 2023-03-24 DIAGNOSIS — N3941 Urge incontinence: Secondary | ICD-10-CM

## 2023-03-25 ENCOUNTER — Encounter: Payer: Self-pay | Admitting: Rehabilitative and Restorative Service Providers"

## 2023-03-25 ENCOUNTER — Ambulatory Visit: Admitting: Rehabilitative and Restorative Service Providers"

## 2023-03-25 DIAGNOSIS — R262 Difficulty in walking, not elsewhere classified: Secondary | ICD-10-CM | POA: Diagnosis not present

## 2023-03-25 DIAGNOSIS — M25562 Pain in left knee: Secondary | ICD-10-CM | POA: Diagnosis not present

## 2023-03-25 DIAGNOSIS — M6281 Muscle weakness (generalized): Secondary | ICD-10-CM | POA: Diagnosis not present

## 2023-03-25 DIAGNOSIS — R600 Localized edema: Secondary | ICD-10-CM | POA: Diagnosis not present

## 2023-03-25 DIAGNOSIS — M25662 Stiffness of left knee, not elsewhere classified: Secondary | ICD-10-CM | POA: Diagnosis not present

## 2023-03-25 DIAGNOSIS — G8929 Other chronic pain: Secondary | ICD-10-CM | POA: Diagnosis not present

## 2023-03-25 NOTE — Therapy (Addendum)
 OUTPATIENT PHYSICAL THERAPY TREATMENT   Patient Name: Keith Watkins MRN: [id number] DOB:02-May-1945, 78 y.o., male Today's Date: 03/25/2023   END OF SESSION:  PT End of Session - 03/25/23 1012     Visit Number 11    Number of Visits 20    Date for PT Re-Evaluation 04/27/23    Authorization Type [employer] $10 copay    Progress Note Due on Visit 20    PT Start Time 1014    Activity Tolerance Patient tolerated treatment well;Patient limited by pain   back pain   Behavior During Therapy [facility] for tasks assessed/performed              Past Medical History:  Diagnosis Date   Arthritis    Back pain    Cataract    Cellulitis 07/31/2011   Complication of anesthesia    Pt wakes up too quickly   Enlarged prostate    Foot abscess, left 08/07/2011   Hypertension    prior to gastric bypass- no meds taken   Lumbar spinal stenosis    Obesity 08/09/2011   Past Surgical History:  Procedure Laterality Date   APPENDECTOMY  1963   CHOLECYSTECTOMY  1984   GASTRIC FUNDOPLICATION  1982   at [facility]   I & D EXTREMITY  08/07/2011   Procedure: IRRIGATION AND DEBRIDEMENT EXTREMITY;  Surgeon: Toni Arthurs, MD;  Location: [facility];  Service: Orthopedics;  Laterality: Left;  irrigation and debridement left foot abscess   KNEE ARTHROSCOPY Right    TOTAL KNEE ARTHROPLASTY Left 02/01/2023   Procedure: LEFT TOTAL KNEE ARTHROPLASTY;  Surgeon: Kathryne Hitch, MD;  Location: [facility] OR;  Service: Orthopedics;  Laterality: Left;   Patient Active Problem List   Diagnosis Date Noted   Status post total left knee replacement 02/01/2023   Bilateral lower extremity edema 06/21/2018   Cellulitis of left lower leg 08/21/2016   Eosinophilia 03/18/2014   Lumbar spinal stenosis    Benign prostatic hyperplasia (BPH) with urinary urge incontinence 08/09/2011    PCP: Donita Brooks, MD   REFERRING PROVIDER: Kathryne Hitch  REFERRING DIAG:  Z36.644 Status post total left  knee replacement  - Primary  M17.12 Primary osteoarthritis of left knee   THERAPY DIAG:  Chronic pain of left knee  Muscle weakness (generalized)  Stiffness of left knee, not elsewhere classified  Difficulty in walking, not elsewhere classified  Localized edema  Rationale for Evaluation and Treatment: Rehabilitation  ONSET DATE: Lt TKA 02/01/2023  SUBJECTIVE:   SUBJECTIVE STATEMENT: States that he went by the station to look and see what equipment they have available for him to use. He states his back has really started to increase and makes it hard for him to walk longer distances.  Reported continued improvement in knee and felt appreciative of his gains with therapy  PERTINENT HISTORY: Lt TKA, Lumbar stenosis, History of Lt leg cellulitis, Lt foot wound (healed)   PAIN:  NPRS scale: no specific pain upon arrival.  Pain location: Lt knee Pain description: Tightness Aggravating factors: bending the knee  Relieving factors: medicine- Tylenol, ice   PRECAUTIONS: None  WEIGHT BEARING RESTRICTIONS: No  FALLS:  Has patient fallen in last 6 months? No  LIVING ENVIRONMENT: Lives with: lives with their family and currently living at daughters since she is able to be with him 24/7 Lives in: House/apartment, daughters house has stairs with a rail and his home has a metal ramp Stairs: Yes: External: 4 steps;  on right going up Has following equipment at home: Single point cane, Walker - 2 wheeled, Environmental consultant - 4 wheeled, and Ramped entry  OCCUPATION: retired  PLOF: Independent, ambulating with cane  PATIENT GOALS:  Reduce pain, walk better.    Next MD visit: 03/09/2023 Dr. Magnus Ivan, MD   OBJECTIVE:   DIAGNOSTIC FINDINGS: FINDINGS: Left knee arthroplasty in expected alignment. No periprosthetic lucency or fracture. There has been patellar resurfacing. Recent postsurgical change includes air and edema in the soft tissues and joint space. Anterior skin staples.    IMPRESSION: Left knee arthroplasty without immediate postoperative complication.    PATIENT SURVEYS:  Patient-specific activity scoring scheme   "0" represents "unable to perform." "10" represents "able to perform at prior level. 0 1 2 3 4 5 6 7 8 9  10 (Date and Score) Activity Initial  Activity 02/16/2022  03/10/2023  Stand for long time 6   10 for knee (back limited)  2. Walk distances 0  5 with cane   3. Climb stairs 0 1      Total 2/10 average 5.33 average   Total score = sum of the activity scores/number of activities Minimum detectable change (90%CI) for average score = 2 points Minimum detectable change (90%CI) for single activity score = 3 points  Score: average 2/10   COGNITION: 02/17/2023 Overall cognitive status: WFL    SENSATION: 02/17/2023 Not tested  EDEMA:  02/17/2023 Circumferential: 27.25 in above knee 19.25 below knee  MUSCLE LENGTH: 02/17/2023 Not tested  POSTURE:  02/17/2023 No Significant postural limitations  PALPATION: 02/17/2023 Not tender to touch at calf or knee.   LOWER EXTREMITY ROM:   ROM Right 02/17/2023 Left 02/17/2023 Left  02/23/2023 Left  03/04/2023 Left  03/25/2023  Hip flexion       Hip extension       Hip abduction       Hip adduction       Hip internal rotation       Hip external rotation       Knee flexion  A:82 P: 87 A:88 P:95; due to pain 114 A:120  Knee extension  A: 2 P: 0 A: 0 0 A: 0  Ankle dorsiflexion       Ankle plantarflexion       Ankle inversion       Ankle eversion        (Blank rows = not tested)  LOWER EXTREMITY MMT:  MMT Right 02/17/2023 Left 02/17/2023 Right  03/25/2023 dynamometry Left 03/25/2023 dynamometry  Hip flexion      Hip extension      Hip abduction      Hip adduction      Hip internal rotation      Hip external rotation      Knee flexion      Knee extension  3/5 observed due to able to complete full range against gravity 62.5# and 58.5# 40# and 38#  Ankle dorsiflexion       Ankle plantarflexion      Ankle inversion      Ankle eversion       (Blank rows = not tested)  FUNCTIONAL TESTS:  02/17/2023 Not tested  GAIT: 03/17/2023: SPC use with intermittent ambulation carrying SPC, good sequencing and weight acceptance to LLE  03/08/2023:  SPC use in clinic with good sequencing.   02/17/2023 Distance walked: clinic distances Assistive device utilized: Environmental consultant - 2 wheeled Level of assistance: Modified independence Comments: decreased step length, decreased stance time on L,  decreased cadence                                                                                                                                                                        TODAY'S TREATMENT                                                                        DATE:  03/25/2023 Therex: Precore bike, 8 mins full revolutions, seat 8, level 4 Standing lumbar extension AROM throughout session as back pain increased ROM measured with results in above table  To address back pain while also incorporating knee activity Figure 4 cross stretch with therapist over pressure 2x20sec ea side Prone push ups x10 Bridges x10; vc for form   Additional time spent in education of use of gym machine and setup with question and answer time to improve confidence and knowledge in activity.   Theract: (to promote improved stength/control in transfers, stairs) Leg extension machine  BUE up with LLE lower 2x10 10lb.  Additional time to allow for slow control motion focus.     TODAY'S TREATMENT                                                                        DATE:  03/17/2023 Therex: Precore bike, 8 mins full revolutions, seat 8 Standing lumbar extension AROM throughout session as back pain increased  Theract: Leg press BLE 112# x15 slow eccentric. LLE 56# 2x15 Step up WB on Lt leg 4 inch step x 15 with single hand assist, lateral step up/down 4 inch step x 15 WB on Lt leg.  Sit to stand to  sit 21.5 inch table   Neuro: Heel toe rocking on airex 2x20; UE hold with intermittent hovering over railing //bar side stepping on foam beam with stepping on/off and ends x3 lengths; UE assist increasing as fatigue set in, required long seated rest break after activity Black mat with contralateral leg tapping 4 corners x 6 each way with UE assist, performed bilaterally with mirror feedback; standing rest breaks between with back extensions.   Vaso: , to Lt knee, 34 deg, med pressure, BLE elevated.  TODAY'S TREATMENT  DATE:  03/15/2023 Therex: UBE LE only for ROM 6 mins, seat 12 Standing lumbar extension AROM x 5 to tolerance .  Cues for home use throughout day as needed.   Theract: (to improve progressive mobility, stairs, squatting, transfers, ambulation) Leg press double leg 106 lbs x 20 slow lowering focus.  Lt leg 50 lbs 2 x 15   Neuro: (improve balance/coordination, muscle activation) SLS on black mat with contralateral leg tapping 4 corners x 6 each way with moderate HHA, performed bilaterally  with mirror feedback Tandem stance 1 min x 1 bilateral on floor, 1 min x 1 bilateral on foam in // bars with occasional HHA with mirror feedback Tandem ambulation on foam 6 ft in // bars fwd/back 6 x each way with occasional to moderate HHA on bars with mirror feedback Instruction on cross friction massage to incision to decrease sensitivity and promote improved tissue mobility. Tactile cues given.    Vaso: Lt knee, , 34 deg, med pressure, BLE elevated in supine.  TODAY'S TREATMENT                                                                        DATE:  03/10/2023 Therex: UBE LE only for ROM 9 mins, seat 12 Seated quad set with SLR Lt x 15 (review of positioning for home, encouraged 1 set per day right now) Incline gastroc stretch 3 x 30 secs with heels on ground Seated Lt leg LAQ with end range pauses  both direction x 15   Theract: (to improve progressive mobility, stairs, squatting, transfers, ambulation) Step up WB on Lt leg 4 inch step x 15 with single hand assist, lateral step up/down 4 inch step x 15 WB on Lt leg.  Sit to stand to sit 21.5 inch table    Neuro: (improve balance/coordination, muscle activation) SLS on black mat with contralateral leg tapping 4 corners x 5 each way with moderate HHA BIG inspired retro stepping in // bars x 15 bilaterally with occasional HHA on bar   Vaso: Lt knee, , 34 deg, med pressure, BLE elevated in supine.   HOME EXERCISE PROGRAM: Access Code: [email or web address] URL: [email or web address] Date: 03/02/2023 Prepared by: Pauletta Browns  Exercises - Supine Heel Slide (Mirrored)  - 3-5 x daily - 7 x weekly - 1 sets - 10 reps - 5 hold - Supine Heel Slide with Strap  - 3-5 x daily - 7 x weekly - 1 sets - 10 reps - 5 hold - Seated Long Arc Quad (Mirrored)  - 3-5 x daily - 7 x weekly - 1 sets - 5-10 reps - 2 hold - Supine Knee Extension Mobilization with Weight (Mirrored)  - 4-5 x daily - 7 x weekly - 1 sets - 1 reps - to tolerance up to 15 mins hold - Seated Quad Set (Mirrored)  - 3-5 x daily - 7 x weekly - 1 sets - 10 reps - 5 hold - Supine Quadriceps Stretch with Strap on Table  - 2 x daily - 7 x weekly - 5 reps - 20 seconds hold  ASSESSMENT:  CLINICAL IMPRESSION: Patient has done well with activity and range of motion, however back pain continues to remain an issue for patient tolerance to exercise.  Patient making progress towards goals however hard to assess goals as back pain persists. Will continue to benefit from skilled physical therapy.  OBJECTIVE IMPAIRMENTS: Abnormal gait, decreased activity tolerance, decreased balance, decreased coordination, decreased endurance, decreased knowledge of use of DME, decreased mobility, difficulty walking, decreased ROM, decreased strength, increased edema, obesity, and pain.   ACTIVITY  LIMITATIONS: carrying, lifting, bending, sitting, standing, squatting, stairs, bed mobility, and locomotion level  PARTICIPATION LIMITATIONS: meal prep, cleaning, laundry, interpersonal relationship, driving, shopping, community activity, occupation, and yard work  PERSONAL FACTORS: Age, Fitness, Time since onset of injury/illness/exacerbation, and 3+ comorbidities: PMH includes BPH, lumbar spinal stenosis, PVD, HTN  are also affecting patient's functional outcome.   REHAB POTENTIAL: Good  CLINICAL DECISION MAKING: Evolving/moderate complexity  EVALUATION COMPLEXITY: Moderate   GOALS: Goals reviewed with patient? Yes  SHORT TERM GOALS: (target date for Short term goals are 3 weeks 03/09/2023)   1.  Patient will demonstrate independent use of home exercise program to maintain progress from in clinic treatments.  Goal status: Met 03/02/2023  LONG TERM GOALS: (target dates for all long term goals are 10 weeks  04/27/2023 )   1. Patient will demonstrate/report pain at worst less than or equal to 2/10 to facilitate minimal limitation in daily activity secondary to pain symptoms.  Goal status: MET 03/25/2023   2. Patient will demonstrate independent use of home exercise program to facilitate ability to maintain/progress functional gains from skilled physical therapy services.  Goal status: on going 03/25/2023  3. Patient will demonstrate/report PSFS scoring at average > or = 8/10 scoring for improved functional activity performance.  Goal status: on going 03/25/2023   4.  Patient will demonstrate Lt LE MMT 5/5 throughout to faciltiate usual transfers, stairs, squatting at [facility] for daily life.   Goal status: on going 03/25/2023   5.  Patient will demonstrate or report ability to perform 18in chair transfer without UE assist. Goal status: on going 03/25/2023   6.  Patient will ambulate community distances with LRAD independently to decrease caregiver burden Goal status: on going 03/25/2023    7.  Patient will demonstrate 0-115 deg knee AROM to improve mobility and functional tasks. Goal Status: MET 03/25/2023  8. Patient will demonstrate ascending/descending stairs reciprocally s UE assist for community integration.   Goal status : on going 03/25/2023    PLAN:  PT FREQUENCY: 1-2x/week  PT DURATION: 10 weeks  PLANNED INTERVENTIONS: Can include 44034- PT Re-evaluation, 97110-Therapeutic exercises, 97530- Therapeutic activity, 97112- Neuromuscular re-education, 97535- Self Care, 97140- Manual therapy, [phone]- Gait training, [phone]- Orthotic Fit/training, [phone]- Canalith repositioning, [id number]- Aquatic Therapy, 97014- Electrical stimulation (unattended), [id number]- Electrical stimulation (manual), [id number] Physical performance testing, 97016- Vasopneumatic device, [id number]- Ultrasound, [id number]- Traction (mechanical), [id number]- Ionotophoresis 4mg /ml Dexamethasone, Patient/Family education, Balance training, Stair training, Taping, Dry Needling, Joint mobilization, Joint manipulation, Spinal manipulation, Scar mobilization, Vestibular training, Visual/preceptual remediation/compensation, DME instructions, Cryotherapy, and Moist heat.  All performed as medically necessary.  All included unless contraindicated  PLAN FOR NEXT SESSION: continue LAQ machine and leg press, assess balance and do challenging activities, continue back pain education. Education on gym equipment.     Drewey Begue, Evangelene Vora, Student-PT 03/25/2023, 10:54 AM

## 2023-03-25 NOTE — Telephone Encounter (Signed)
 Requested Prescriptions  Pending Prescriptions Disp Refills   tamsulosin (FLOMAX) 0.4 MG CAPS capsule [Pharmacy Med Name: TAMSULOSIN HCL 0.4 MG CAPSULE] 90 capsule 0    Sig: TAKE 1 CAPSULE BY MOUTH EVERYDAY AT BEDTIME     Urology: Alpha-Adrenergic Blocker Failed - 03/25/2023 11:25 AM      Failed - PSA in normal range and within 360 days    PSA  Date Value Ref Range Status  01/07/2022 0.31 < OR = 4.00 ng/mL Final    Comment:    The total PSA value from this assay system is  standardized against the WHO standard. The test  result will be approximately 20% lower when compared  to the equimolar-standardized total PSA (Beckman  Coulter). Comparison of serial PSA results should be  interpreted with this fact in mind. . This test was performed using the Siemens  chemiluminescent method. Values obtained from  different assay methods cannot be used interchangeably. PSA levels, regardless of value, should not be interpreted as absolute evidence of the presence or absence of disease.          Failed - Valid encounter within last 12 months    Recent Outpatient Visits           2 years ago Leg swelling   [facility] Donita Brooks, MD   2 years ago Abscess of left lower leg   [facility] Donita Brooks, MD   2 years ago Abscess of left lower leg   [facility] Donita Brooks, MD   2 years ago Abscess of left lower leg   [facility] Donita Brooks, MD   2 years ago Abscess of left lower leg   [facility] Pickard, Priscille Heidelberg, MD              Passed - Last BP in normal range    BP Readings from Last 1 Encounters:  02/04/23 122/71

## 2023-03-28 ENCOUNTER — Ambulatory Visit (INDEPENDENT_AMBULATORY_CARE_PROVIDER_SITE_OTHER): Admitting: Rehabilitative and Restorative Service Providers"

## 2023-03-28 ENCOUNTER — Encounter: Payer: Self-pay | Admitting: Rehabilitative and Restorative Service Providers"

## 2023-03-28 DIAGNOSIS — G8929 Other chronic pain: Secondary | ICD-10-CM

## 2023-03-28 DIAGNOSIS — M25662 Stiffness of left knee, not elsewhere classified: Secondary | ICD-10-CM | POA: Diagnosis not present

## 2023-03-28 DIAGNOSIS — M25562 Pain in left knee: Secondary | ICD-10-CM | POA: Diagnosis not present

## 2023-03-28 DIAGNOSIS — R600 Localized edema: Secondary | ICD-10-CM | POA: Diagnosis not present

## 2023-03-28 DIAGNOSIS — R262 Difficulty in walking, not elsewhere classified: Secondary | ICD-10-CM

## 2023-03-28 DIAGNOSIS — M6281 Muscle weakness (generalized): Secondary | ICD-10-CM

## 2023-03-28 NOTE — Therapy (Signed)
 OUTPATIENT PHYSICAL THERAPY TREATMENT   Patient Name: Keith Watkins MRN: [id number] DOB:[birth year], 78 y.o., male Today's Date: 03/28/2023   END OF SESSION:  PT End of Session - 03/28/23 1020     Visit Number 12    Number of Visits 20    Date for PT Re-Evaluation 04/27/23    Authorization Type [employer] $10 copay    Progress Note Due on Visit 20    PT Start Time 1011    PT Stop Time 1058    PT Time Calculation (min) 47 min    Activity Tolerance Patient tolerated treatment well;Patient limited by pain   back pain   Behavior During Therapy [facility] for tasks assessed/performed              Past Medical History:  Diagnosis Date   Arthritis    Back pain    Cataract    Cellulitis 07/31/2011   Complication of anesthesia    Pt wakes up too quickly   Enlarged prostate    Foot abscess, left 08/07/2011   Hypertension    prior to gastric bypass- no meds taken   Lumbar spinal stenosis    Obesity 08/09/2011   Past Surgical History:  Procedure Laterality Date   APPENDECTOMY  1963   CHOLECYSTECTOMY  1984   GASTRIC FUNDOPLICATION  1982   at [facility]   I & D EXTREMITY  08/07/2011   Procedure: IRRIGATION AND DEBRIDEMENT EXTREMITY;  Surgeon: Toni Arthurs, MD;  Location: [facility];  Service: Orthopedics;  Laterality: Left;  irrigation and debridement left foot abscess   KNEE ARTHROSCOPY Right    TOTAL KNEE ARTHROPLASTY Left 02/01/2023   Procedure: LEFT TOTAL KNEE ARTHROPLASTY;  Surgeon: Kathryne Hitch, MD;  Location: [facility];  Service: Orthopedics;  Laterality: Left;   Patient Active Problem List   Diagnosis Date Noted   Status post total left knee replacement 02/01/2023   Bilateral lower extremity edema 06/21/2018   Cellulitis of left lower leg 08/21/2016   Eosinophilia 03/18/2014   Lumbar spinal stenosis    Benign prostatic hyperplasia (BPH) with urinary urge incontinence 08/09/2011    PCP: Donita Brooks, MD   REFERRING PROVIDER: Kathryne Hitch  REFERRING DIAG:  W09.811 Status post total left knee replacement  - Primary  M17.12 Primary osteoarthritis of left knee   THERAPY DIAG:  Chronic pain of left knee  Muscle weakness (generalized)  Stiffness of left knee, not elsewhere classified  Difficulty in walking, not elsewhere classified  Localized edema  Rationale for Evaluation and Treatment: Rehabilitation  ONSET DATE: Lt TKA 02/01/2023  SUBJECTIVE:   SUBJECTIVE STATEMENT: Pt indicated he joined the [employer].  Pt indicated trying to work on HEP related to back to help symptoms.  Still noticing increased activity does impact back symptoms.   PERTINENT HISTORY: Lt TKA, Lumbar stenosis, History of Lt leg cellulitis, Lt foot wound (healed)   PAIN:  NPRS scale: no specific pain upon arrival.  Pain location: Lt knee Pain description: Tightness Aggravating factors: bending the knee  Relieving factors: medicine- Tylenol, ice   PRECAUTIONS: None  WEIGHT BEARING RESTRICTIONS: No  FALLS:  Has patient fallen in last 6 months? No  LIVING ENVIRONMENT: Lives with: lives with their family and currently living at daughters since she is able to be with him 24/7 Lives in: House/apartment, daughters house has stairs with a rail and his home has a metal ramp Stairs: Yes: External: 4 steps; on right going up Has following equipment  at home: Single point cane, Walker - 2 wheeled, Environmental consultant - 4 wheeled, and Ramped entry  OCCUPATION: retired  PLOF: Independent, ambulating with cane  PATIENT GOALS:  Reduce pain, walk better.    Next MD visit: 03/09/2023 Dr. Magnus Ivan, MD   OBJECTIVE:   DIAGNOSTIC FINDINGS: FINDINGS: Left knee arthroplasty in expected alignment. No periprosthetic lucency or fracture. There has been patellar resurfacing. Recent postsurgical change includes air and edema in the soft tissues and joint space. Anterior skin staples.   IMPRESSION: Left knee arthroplasty without immediate postoperative  complication.    PATIENT SURVEYS:  Patient-specific activity scoring scheme   "0" represents "unable to perform." "10" represents "able to perform at prior level. 0 1 2 3 4 5 6 7 8 9  10 (Date and Score) Activity Initial  Activity 02/16/2022  03/10/2023  Stand for long time 6   10 for knee (back limited)  2. Walk distances 0  5 with cane   3. Climb stairs 0 1      Total 2/10 average 5.33 average   Total score = sum of the activity scores/number of activities Minimum detectable change (90%CI) for average score = 2 points Minimum detectable change (90%CI) for single activity score = 3 points  Score: average 2/10   COGNITION: 02/17/2023 Overall cognitive status: WFL    SENSATION: 02/17/2023 Not tested  EDEMA:  02/17/2023 Circumferential: 27.25 in above knee 19.25 below knee  MUSCLE LENGTH: 02/17/2023 Not tested  POSTURE:  02/17/2023 No Significant postural limitations  PALPATION: 02/17/2023 Not tender to touch at calf or knee.   LOWER EXTREMITY ROM:   ROM Right 02/17/2023 Left 02/17/2023 Left  02/23/2023 Left  03/04/2023 Left  03/25/2023  Hip flexion       Hip extension       Hip abduction       Hip adduction       Hip internal rotation       Hip external rotation       Knee flexion  A:82 P: 87 A:88 P:95; due to pain 114 A:120  Knee extension  A: 2 P: 0 A: 0 0 A: 0  Ankle dorsiflexion       Ankle plantarflexion       Ankle inversion       Ankle eversion        (Blank rows = not tested)  LOWER EXTREMITY MMT:  MMT Right 02/17/2023 Left 02/17/2023 Right  03/25/2023 dynamometry Left 03/25/2023 dynamometry  Hip flexion      Hip extension      Hip abduction      Hip adduction      Hip internal rotation      Hip external rotation      Knee flexion      Knee extension  3/5 observed due to able to complete full range against gravity 62.5# and 58.5# 40# and 38#  Ankle dorsiflexion      Ankle plantarflexion      Ankle inversion      Ankle eversion        (Blank rows = not tested)  FUNCTIONAL TESTS:  02/17/2023 Not tested  GAIT: 03/28/2023: within //bar for safety, patient ambulated without use of UE however had to be cued to use arms while walking. Antalgic gait with decreased stance on LLE however demonstrated good balance.  03/17/2023: SPC use with intermittent ambulation carrying SPC, good sequencing and weight acceptance to LLE  03/08/2023:  SPC use in clinic with good sequencing.  02/17/2023 Distance walked: clinic distances Assistive device utilized: Environmental consultant - 2 wheeled Level of assistance: Modified independence Comments: decreased step length, decreased stance time on L, decreased cadence                                                                                                                                                                        TODAY'S TREATMENT                                                                        DATE:  03/28/2023 Therex: Precore bike, 11 mins full revolutions, seat 8, level 4 Standing lumbar extension AROM throughout session as back pain increased Seated lumbar extension AROM x6 with seated rest break  Theract: Leg press BLE 112# x15 slow eccentric. LLE 62# x15 then x10  Sit to stand to sit x10; 1UE on armrest to stand, no UE use on armrest to sit  Neuro: Heel toe rocking on airex x20; UE hovering over railing Marching on foam 2x10 ea //bar side stepping on foam beam with stepping on/off and ends x4 lengths; UE assist increasing throughout Fwd walking //bar x4 lengths; vc for use of UE swing  TODAY'S TREATMENT                                                                        DATE:  03/25/2023 Therex: Precore bike, 8 mins full revolutions, seat 8, level 4 Standing lumbar extension AROM throughout session as back pain increased ROM measured with results in above table  To address back pain while also incorporating knee activity Figure 4 cross stretch with therapist over  pressure 2x20sec ea side Prone push ups x10 Bridges x10; vc for form   Additional time spent in education of use of gym machine and setup with question and answer time to improve confidence and knowledge in activity.   Theract: (to promote improved stength/control in transfers, stairs) Leg extension machine  BUE up with LLE lower 2x10 10lb.  Additional time to allow for slow control motion focus.     TODAY'S TREATMENT  DATE:  03/17/2023 Therex: Precore bike, 8 mins full revolutions, seat 8 Standing lumbar extension AROM throughout session as back pain increased  Theract: Leg press BLE 112# x15 slow eccentric. LLE 56# 2x15 Step up WB on Lt leg 4 inch step x 15 with single hand assist, lateral step up/down 4 inch step x 15 WB on Lt leg.  Sit to stand to sit 21.5 inch table   Neuro: Heel toe rocking on airex 2x20; UE hold with intermittent hovering over railing //bar side stepping on foam beam with stepping on/off and ends x3 lengths; UE assist increasing as fatigue set in, required long seated rest break after activity Black mat with contralateral leg tapping 4 corners x 6 each way with UE assist, performed bilaterally with mirror feedback; standing rest breaks between with back extensions.   Vaso: , to Lt knee, 34 deg, med pressure, BLE elevated.  TODAY'S TREATMENT                                                                        DATE:  03/15/2023 Therex: UBE LE only for ROM 6 mins, seat 12 Standing lumbar extension AROM x 5 to tolerance .  Cues for home use throughout day as needed.   Theract: (to improve progressive mobility, stairs, squatting, transfers, ambulation) Leg press double leg 106 lbs x 20 slow lowering focus.  Lt leg 50 lbs 2 x 15   Neuro: (improve balance/coordination, muscle activation) SLS on black mat with contralateral leg tapping 4 corners x 6 each way with moderate HHA, performed  bilaterally  with mirror feedback Tandem stance 1 min x 1 bilateral on floor, 1 min x 1 bilateral on foam in // bars with occasional HHA with mirror feedback Tandem ambulation on foam 6 ft in // bars fwd/back 6 x each way with occasional to moderate HHA on bars with mirror feedback Instruction on cross friction massage to incision to decrease sensitivity and promote improved tissue mobility. Tactile cues given.    Vaso: Lt knee, , 34 deg, med pressure, BLE elevated in supine.  TODAY'S TREATMENT                                                                        DATE:  03/10/2023 Therex: UBE LE only for ROM 9 mins, seat 12 Seated quad set with SLR Lt x 15 (review of positioning for home, encouraged 1 set per day right now) Incline gastroc stretch 3 x 30 secs with heels on ground Seated Lt leg LAQ with end range pauses both direction x 15   Theract: (to improve progressive mobility, stairs, squatting, transfers, ambulation) Step up WB on Lt leg 4 inch step x 15 with single hand assist, lateral step up/down 4 inch step x 15 WB on Lt leg.  Sit to stand to sit 21.5 inch table    Neuro: (improve balance/coordination, muscle activation) SLS on black mat with contralateral leg tapping 4 corners x 5 each way  with moderate HHA BIG inspired retro stepping in // bars x 15 bilaterally with occasional HHA on bar   Vaso: Lt knee, , 34 deg, med pressure, BLE elevated in supine.   HOME EXERCISE PROGRAM: Access Code: [email or web address] URL: [email or web address] Date: 03/02/2023 Prepared by: Pauletta Browns  Exercises - Supine Heel Slide (Mirrored)  - 3-5 x daily - 7 x weekly - 1 sets - 10 reps - 5 hold - Supine Heel Slide with Strap  - 3-5 x daily - 7 x weekly - 1 sets - 10 reps - 5 hold - Seated Long Arc Quad (Mirrored)  - 3-5 x daily - 7 x weekly - 1 sets - 5-10 reps - 2 hold - Supine Knee Extension Mobilization with Weight (Mirrored)  - 4-5 x daily - 7 x weekly - 1 sets - 1 reps  - to tolerance up to 15 mins hold - Seated Quad Set (Mirrored)  - 3-5 x daily - 7 x weekly - 1 sets - 10 reps - 5 hold - Supine Quadriceps Stretch with Strap on Table  - 2 x daily - 7 x weekly - 5 reps - 20 seconds hold  ASSESSMENT:  CLINICAL IMPRESSION: Patient did well with activity and was shown additional back extension exercise to help alleviate back pain. Educated on use of back extensions when back pain starts rather than waiting until the pain has reached critical levels. Patient has progressed well, expect discharge at next session.  OBJECTIVE IMPAIRMENTS: Abnormal gait, decreased activity tolerance, decreased balance, decreased coordination, decreased endurance, decreased knowledge of use of DME, decreased mobility, difficulty walking, decreased ROM, decreased strength, increased edema, obesity, and pain.   ACTIVITY LIMITATIONS: carrying, lifting, bending, sitting, standing, squatting, stairs, bed mobility, and locomotion level  PARTICIPATION LIMITATIONS: meal prep, cleaning, laundry, interpersonal relationship, driving, shopping, community activity, occupation, and yard work  PERSONAL FACTORS: Age, Fitness, Time since onset of injury/illness/exacerbation, and 3+ comorbidities: PMH includes BPH, lumbar spinal stenosis, PVD, HTN  are also affecting patient's functional outcome.   REHAB POTENTIAL: Good  CLINICAL DECISION MAKING: Evolving/moderate complexity  EVALUATION COMPLEXITY: Moderate   GOALS: Goals reviewed with patient? Yes  SHORT TERM GOALS: (target date for Short term goals are 3 weeks 03/09/2023)   1.  Patient will demonstrate independent use of home exercise program to maintain progress from in clinic treatments.  Goal status: Met 03/02/2023  LONG TERM GOALS: (target dates for all long term goals are 10 weeks  04/27/2023 )   1. Patient will demonstrate/report pain at worst less than or equal to 2/10 to facilitate minimal limitation in daily activity secondary to pain  symptoms.  Goal status: MET 03/25/2023   2. Patient will demonstrate independent use of home exercise program to facilitate ability to maintain/progress functional gains from skilled physical therapy services.  Goal status: on going 03/25/2023  3. Patient will demonstrate/report PSFS scoring at average > or = 8/10 scoring for improved functional activity performance.  Goal status: on going 03/25/2023   4.  Patient will demonstrate Lt LE MMT 5/5 throughout to faciltiate usual transfers, stairs, squatting at [facility] for daily life.   Goal status: on going 03/25/2023   5.  Patient will demonstrate or report ability to perform 18in chair transfer without UE assist. Goal status: on going 03/25/2023   6.  Patient will ambulate community distances with LRAD independently to decrease caregiver burden Goal status: on going 03/25/2023   7.  Patient will demonstrate 0-115 deg knee AROM to improve mobility and  functional tasks. Goal Status: MET 03/25/2023  8. Patient will demonstrate ascending/descending stairs reciprocally s UE assist for community integration.   Goal status : on going 03/25/2023    PLAN:  PT FREQUENCY: 1-2x/week  PT DURATION: 10 weeks  PLANNED INTERVENTIONS: Can include 16109- PT Re-evaluation, 97110-Therapeutic exercises, 97530- Therapeutic activity, 97112- Neuromuscular re-education, 97535- Self Care, 97140- Manual therapy, [phone]- Gait training, [phone]- Orthotic Fit/training, [phone]- Canalith repositioning, [id number]- Aquatic Therapy, 97014- Electrical stimulation (unattended), [id number]- Electrical stimulation (manual), [id number] Physical performance testing, 97016- Vasopneumatic device, [id number]- Ultrasound, [id number]- Traction (mechanical), [id number]- Ionotophoresis 4mg /ml Dexamethasone, Patient/Family education, Balance training, Stair training, Taping, Dry Needling, Joint mobilization, Joint manipulation, Spinal manipulation, Scar mobilization, Vestibular training, Visual/preceptual  remediation/compensation, DME instructions, Cryotherapy, and Moist heat.  All performed as medically necessary.  All included unless contraindicated  PLAN FOR NEXT SESSION: Expect discharge, ask how [employer] exercise went, gym equipment set up education     Alisan Dokes, Festus Pursel, Student-PT 03/28/2023, 10:58 AM

## 2023-04-04 ENCOUNTER — Other Ambulatory Visit: Payer: Self-pay | Admitting: Orthopaedic Surgery

## 2023-04-04 ENCOUNTER — Telehealth: Payer: Self-pay | Admitting: *Deleted

## 2023-04-04 MED ORDER — METHOCARBAMOL 500 MG PO TABS: 500.0000 mg | ORAL_TABLET | Freq: Four times a day (QID) | ORAL | 1 refills | Status: DC | PRN

## 2023-04-04 MED ORDER — OXYCODONE HCL 5 MG PO TABS: 5.0000 mg | ORAL_TABLET | Freq: Three times a day (TID) | ORAL | 0 refills | Status: DC | PRN

## 2023-04-04 NOTE — Telephone Encounter (Signed)
 Patient called requesting refill of muscle relaxer as well as pain medication. Really not taking much pain medication, but does like it after OPPT typically. Thank you.

## 2023-04-06 ENCOUNTER — Other Ambulatory Visit (INDEPENDENT_AMBULATORY_CARE_PROVIDER_SITE_OTHER): Payer: Self-pay

## 2023-04-06 ENCOUNTER — Ambulatory Visit (INDEPENDENT_AMBULATORY_CARE_PROVIDER_SITE_OTHER): Admitting: Orthopaedic Surgery

## 2023-04-06 ENCOUNTER — Encounter: Payer: Self-pay | Admitting: Rehabilitative and Restorative Service Providers"

## 2023-04-06 ENCOUNTER — Ambulatory Visit: Admitting: Rehabilitative and Restorative Service Providers"

## 2023-04-06 ENCOUNTER — Encounter: Payer: Self-pay | Admitting: Orthopaedic Surgery

## 2023-04-06 DIAGNOSIS — G8929 Other chronic pain: Secondary | ICD-10-CM

## 2023-04-06 DIAGNOSIS — Z96652 Presence of left artificial knee joint: Secondary | ICD-10-CM

## 2023-04-06 DIAGNOSIS — M25662 Stiffness of left knee, not elsewhere classified: Secondary | ICD-10-CM | POA: Diagnosis not present

## 2023-04-06 DIAGNOSIS — M25562 Pain in left knee: Secondary | ICD-10-CM

## 2023-04-06 DIAGNOSIS — M6281 Muscle weakness (generalized): Secondary | ICD-10-CM | POA: Diagnosis not present

## 2023-04-06 DIAGNOSIS — R262 Difficulty in walking, not elsewhere classified: Secondary | ICD-10-CM | POA: Diagnosis not present

## 2023-04-06 DIAGNOSIS — R600 Localized edema: Secondary | ICD-10-CM

## 2023-04-06 NOTE — Progress Notes (Signed)
 The patient is here today at 9 weeks status post a left total knee replacement to treat significant left knee arthritis.  He is 78 years old and active.  He has been participating in physical therapy on his knee and actually had a physical therapy session earlier today.  The notes from physical therapy from his session today show that he has active knee flexion to 120 degrees and knee extension is 0 degrees.  This has greatly improved over his postoperative course.  Examination of his left knee shows a well-healed surgical incision.  There is no significant swelling.  He denies any significant pain.  He does ambulate with a cane due to the severity of the arthritis of his right knee.  His extension is full and his flexion is full and the left knee feels ligamentously stable.  2 views of the left knee show well-seated total knee arthroplasty with no complicating features.  The standing view also shows the severity of the arthritis that is bone-on-bone with his right knee.  From the standpoint of his left knee, we do not need to see him back for 6 months unless he is having issues.  He would like to proceed with a right knee replacement at some point.  He is going to start going to the [facility] 3 days a week.  I did give him a note to return to work starting April 11, 2023 but for the first 2 weeks he should work half days only.  Will we do see him back in 6 months I would like a standing AP and lateral of his left operative knee.

## 2023-04-06 NOTE — Therapy (Signed)
 OUTPATIENT PHYSICAL THERAPY TREATMENT / DISCHARGE   Patient Name: Keith Watkins MRN: [id number] DOB:[birth year], 78 y.o., male Today's Date: 04/06/2023  PHYSICAL THERAPY DISCHARGE SUMMARY  Visits from Start of Care: 13  Current functional level related to goals / functional outcomes: See note   Remaining deficits: See note   Education / Equipment: HEP  Patient goals were  mostly met . Patient is being discharged due to being pleased with the current functional level.    END OF SESSION:  PT End of Session - 04/06/23 1157     Visit Number 13    Number of Visits 20    Date for PT Re-Evaluation 04/27/23    Authorization Type [employer] $10 copay    Progress Note Due on Visit 20    PT Start Time 1145    PT Stop Time 1223    PT Time Calculation (min) 38 min    Activity Tolerance Patient tolerated treatment well    Behavior During Therapy [facility] for tasks assessed/performed               Past Medical History:  Diagnosis Date   Arthritis    Back pain    Cataract    Cellulitis 07/31/2011   Complication of anesthesia    Pt wakes up too quickly   Enlarged prostate    Foot abscess, left 08/07/2011   Hypertension    prior to gastric bypass- no meds taken   Lumbar spinal stenosis    Obesity 08/09/2011   Past Surgical History:  Procedure Laterality Date   APPENDECTOMY  1963   CHOLECYSTECTOMY  1984   GASTRIC FUNDOPLICATION  1982   at [facility]   I & D EXTREMITY  08/07/2011   Procedure: IRRIGATION AND DEBRIDEMENT EXTREMITY;  Surgeon: Toni Arthurs, MD;  Location: [facility] ORS;  Service: Orthopedics;  Laterality: Left;  irrigation and debridement left foot abscess   KNEE ARTHROSCOPY Right    TOTAL KNEE ARTHROPLASTY Left 02/01/2023   Procedure: LEFT TOTAL KNEE ARTHROPLASTY;  Surgeon: Kathryne Hitch, MD;  Location: [facility] OR;  Service: Orthopedics;  Laterality: Left;   Patient Active Problem List   Diagnosis Date Noted   Status post total left knee  replacement 02/01/2023   Bilateral lower extremity edema 06/21/2018   Cellulitis of left lower leg 08/21/2016   Eosinophilia 03/18/2014   Lumbar spinal stenosis    Benign prostatic hyperplasia (BPH) with urinary urge incontinence 08/09/2011    PCP: Donita Brooks, MD   REFERRING PROVIDER: Kathryne Hitch  REFERRING DIAG:  W09.811 Status post total left knee replacement  - Primary  M17.12 Primary osteoarthritis of left knee   THERAPY DIAG:  Chronic pain of left knee  Muscle weakness (generalized)  Stiffness of left knee, not elsewhere classified  Difficulty in walking, not elsewhere classified  Localized edema  Rationale for Evaluation and Treatment: Rehabilitation  ONSET DATE: Lt TKA 02/01/2023  SUBJECTIVE:   SUBJECTIVE STATEMENT: Pt indicated he joined the [employer].  Pt indicated trying to work on HEP related to back to help symptoms.  Still noticing increased activity does impact back symptoms.   PERTINENT HISTORY: Lt TKA, Lumbar stenosis, History of Lt leg cellulitis, Lt foot wound (healed)   PAIN:  NPRS scale: no specific pain upon arrival.  Pain location: Lt knee Pain description: Tightness Aggravating factors: bending the knee  Relieving factors: medicine- Tylenol, ice   PRECAUTIONS: None  WEIGHT BEARING RESTRICTIONS: No  FALLS:  Has patient fallen in last  6 months? No  LIVING ENVIRONMENT: Lives with: lives with their family and currently living at daughters since she is able to be with him 24/7 Lives in: House/apartment, daughters house has stairs with a rail and his home has a metal ramp Stairs: Yes: External: 4 steps; on right going up Has following equipment at home: Single point cane, Environmental consultant - 2 wheeled, Environmental consultant - 4 wheeled, and Ramped entry  OCCUPATION: retired  PLOF: Independent, ambulating with cane  PATIENT GOALS:  Reduce pain, walk better.    Next MD visit: 03/09/2023 Dr. Magnus Ivan, MD   OBJECTIVE:   DIAGNOSTIC  FINDINGS: FINDINGS: Left knee arthroplasty in expected alignment. No periprosthetic lucency or fracture. There has been patellar resurfacing. Recent postsurgical change includes air and edema in the soft tissues and joint space. Anterior skin staples.   IMPRESSION: Left knee arthroplasty without immediate postoperative complication.    PATIENT SURVEYS:  Patient-specific activity scoring scheme   "0" represents "unable to perform." "10" represents "able to perform at prior level. 0 1 2 3 4 5 6 7 8 9  10 (Date and Score) Activity Initial  Activity 02/16/2022  03/10/2023 04/06/2023  Stand for long time 6   10 for knee (back limited) 10  2. Walk distances 0  5 with cane  10 (based off Lt knee)  3. Climb stairs 0 1 7       Total 2/10 average 5.33 average 9 avg   Total score = sum of the activity scores/number of activities Minimum detectable change (90%CI) for average score = 2 points Minimum detectable change (90%CI) for single activity score = 3 points    COGNITION: 02/17/2023 Overall cognitive status: WFL    SENSATION: 02/17/2023 Not tested  EDEMA:  02/17/2023 Circumferential: 27.25 in above knee 19.25 below knee  MUSCLE LENGTH: 02/17/2023 Not tested  POSTURE:  02/17/2023 No Significant postural limitations  PALPATION: 02/17/2023 Not tender to touch at calf or knee.   LOWER EXTREMITY ROM:   ROM Right 02/17/2023 Left 02/17/2023 Left  02/23/2023 Left  03/04/2023 Left  03/25/2023  Hip flexion       Hip extension       Hip abduction       Hip adduction       Hip internal rotation       Hip external rotation       Knee flexion  A:82 P: 87 A:88 P:95; due to pain 114 A:120  Knee extension  A: 2 P: 0 A: 0 0 A: 0  Ankle dorsiflexion       Ankle plantarflexion       Ankle inversion       Ankle eversion        (Blank rows = not tested)  LOWER EXTREMITY MMT:  MMT Right 02/17/2023 Left 02/17/2023 Right  03/25/2023 dynamometry Left 03/25/2023 dynamometry  Left 04/06/2023  Hip flexion       Hip extension       Hip abduction       Hip adduction       Hip internal rotation       Hip external rotation       Knee flexion       Knee extension  3/5 observed due to able to complete full range against gravity 62.5# and 58.5# 40# and 38# 5/5 45, 44 lbs  Ankle dorsiflexion       Ankle plantarflexion       Ankle inversion       Ankle eversion        (  Blank rows = not tested)  FUNCTIONAL TESTS:  02/17/2023 Not tested  GAIT: 03/28/2023: within //bar for safety, patient ambulated without use of UE however had to be cued to use arms while walking. Antalgic gait with decreased stance on LLE however demonstrated good balance.  03/17/2023: SPC use with intermittent ambulation carrying SPC, good sequencing and weight acceptance to LLE  03/08/2023:  SPC use in clinic with good sequencing.   02/17/2023 Distance walked: clinic distances Assistive device utilized: Environmental consultant - 2 wheeled Level of assistance: Modified independence Comments: decreased step length, decreased stance time on L, decreased cadence                                                                                                                                                                        TODAY'S TREATMENT                                                                        DATE:  04/06/2023 Therex: Precore bike, 11 mins full revolutions, seat 8, level 4 Incline gastroc stretch 30 sec x 3 bilateral  Verbal review of existing HEP and cues for continued [employer]/gym use.  Handout provided.  Continued education of of edema control exercises.    Neuro: Tandem stance on foam in // bars with occasional HHA 1 min x 2 bilaterally Tandem ambulation on foam 6 ft x 4 each way with occasional to moderate HHA on bars  TODAY'S TREATMENT                                                                        DATE:  03/28/2023 Therex: Precore bike, 11 mins full revolutions, seat 8, level  4 Standing lumbar extension AROM throughout session as back pain increased Seated lumbar extension AROM x6 with seated rest break  Theract: Leg press BLE 112# x15 slow eccentric. LLE 62# x15 then x10  Sit to stand to sit x10; 1UE on armrest to stand, no UE use on armrest to sit  Neuro: Heel toe rocking on airex x20; UE hovering over railing Marching on foam 2x10 ea //bar side stepping on foam beam with stepping on/off and ends x4 lengths; UE assist increasing throughout Fwd walking //  bar x4 lengths; vc for use of UE swing  TODAY'S TREATMENT                                                                        DATE:  03/25/2023 Therex: Precore bike, 8 mins full revolutions, seat 8, level 4 Standing lumbar extension AROM throughout session as back pain increased ROM measured with results in above table  To address back pain while also incorporating knee activity Figure 4 cross stretch with therapist over pressure 2x20sec ea side Prone push ups x10 Bridges x10; vc for form   Additional time spent in education of use of gym machine and setup with question and answer time to improve confidence and knowledge in activity.   Theract: (to promote improved stength/control in transfers, stairs) Leg extension machine  BUE up with LLE lower 2x10 10lb.  Additional time to allow for slow control motion focus.      HOME EXERCISE PROGRAM: Access Code: [email or web address] URL: [email or web address] Date: 04/06/2023 Prepared by: Chyrel Masson  Exercises - Supine Heel Slide (Mirrored)  - 3-5 x daily - 7 x weekly - 1 sets - 10 reps - 5 hold - Seated Long Arc Quad (Mirrored)  - 3-5 x daily - 7 x weekly - 1 sets - 5-10 reps - 2 hold - Supine Knee Extension Mobilization with Weight (Mirrored)  - 4-5 x daily - 7 x weekly - 1 sets - 1 reps - to tolerance up to 15 mins hold - Seated Quad Set (Mirrored)  - 3-5 x daily - 7 x weekly - 1 sets - 10 reps - 5 hold - Supine Quadriceps Stretch with Strap  on Table  - 2 x daily - 7 x weekly - 5 reps - 20 seconds hold - Seated Straight Leg Heel Taps  - 1 x daily - 7 x weekly - 1-2 sets - 10 reps  ASSESSMENT:  CLINICAL IMPRESSION: The patient has attended 13 visits over the course of treatment cycle.  Patient has reported overall improvement at 70%  See objective data above for updated information regarding current presentation. At this time, Pt has demonstrated good knowledge of HEP and has joined gym.  Plan to continue with HEP for continued gains and replace therapy sessions with gym activity.  Pt in agreement with plan.    OBJECTIVE IMPAIRMENTS: Abnormal gait, decreased activity tolerance, decreased balance, decreased coordination, decreased endurance, decreased knowledge of use of DME, decreased mobility, difficulty walking, decreased ROM, decreased strength, increased edema, obesity, and pain.   ACTIVITY LIMITATIONS: carrying, lifting, bending, sitting, standing, squatting, stairs, bed mobility, and locomotion level  PARTICIPATION LIMITATIONS: meal prep, cleaning, laundry, interpersonal relationship, driving, shopping, community activity, occupation, and yard work  PERSONAL FACTORS: Age, Fitness, Time since onset of injury/illness/exacerbation, and 3+ comorbidities: PMH includes BPH, lumbar spinal stenosis, PVD, HTN  are also affecting patient's functional outcome.   REHAB POTENTIAL: Good  CLINICAL DECISION MAKING: Evolving/moderate complexity  EVALUATION COMPLEXITY: Moderate   GOALS: Goals reviewed with patient? Yes  SHORT TERM GOALS: (target date for Short term goals are 3 weeks 03/09/2023)   1.  Patient will demonstrate independent use of home exercise program to maintain progress from in clinic treatments.  Goal status: Met 03/02/2023  LONG TERM GOALS: (target dates for all long term goals are 10 weeks  04/27/2023 )   1. Patient will demonstrate/report pain at worst less than or equal to 2/10 to facilitate minimal limitation in  daily activity secondary to pain symptoms.  Goal status: MET 03/25/2023   2. Patient will demonstrate independent use of home exercise program to facilitate ability to maintain/progress functional gains from skilled physical therapy services.  Goal status: Met 04/06/2023  3. Patient will demonstrate/report PSFS scoring at average > or = 8/10 scoring for improved functional activity performance.  Goal status: Met 04/06/2023   4.  Patient will demonstrate Lt LE MMT 5/5 throughout to faciltiate usual transfers, stairs, squatting at [facility] for daily life.   Goal status:Met 04/06/2023   5.  Patient will demonstrate or report ability to perform 18in chair transfer without UE assist. Goal status: Met 04/06/2023   6.  Patient will ambulate community distances with LRAD independently to decrease caregiver burden Goal status: Mostly Met 04/06/2023   7.  Patient will demonstrate 0-115 deg knee AROM to improve mobility and functional tasks. Goal Status: MET 03/25/2023  8. Patient will demonstrate ascending/descending stairs reciprocally s UE assist for community integration.   Goal status : Met 04/06/2023 for Lt leg    PLAN:  PT FREQUENCY: 1-2x/week  PT DURATION: 10 weeks  PLANNED INTERVENTIONS: Can include 54098- PT Re-evaluation, 97110-Therapeutic exercises, 97530- Therapeutic activity, 97112- Neuromuscular re-education, 97535- Self Care, 97140- Manual therapy, [phone]- Gait training, [phone]- Orthotic Fit/training, [phone]- Canalith repositioning, [id number]- Aquatic Therapy, 97014- Electrical stimulation (unattended), [id number]- Electrical stimulation (manual), [id number] Physical performance testing, 97016- Vasopneumatic device, [id number]- Ultrasound, [id number]- Traction (mechanical), [id number]- Ionotophoresis 4mg /ml Dexamethasone, Patient/Family education, Balance training, Stair training, Taping, Dry Needling, Joint mobilization, Joint manipulation, Spinal manipulation, Scar mobilization, Vestibular training, Visual/preceptual  remediation/compensation, DME instructions, Cryotherapy, and Moist heat.  All performed as medically necessary.  All included unless contraindicated  PLAN FOR NEXT SESSION:Discharge to HEP   Chyrel Masson, PT, DPT, OCS, ATC 04/06/23  12:19 PM

## 2023-04-12 ENCOUNTER — Encounter: Payer: Self-pay | Admitting: Radiology

## 2023-06-21 ENCOUNTER — Other Ambulatory Visit: Payer: Self-pay | Admitting: Family Medicine

## 2023-06-21 DIAGNOSIS — N3941 Urge incontinence: Secondary | ICD-10-CM

## 2023-06-23 ENCOUNTER — Other Ambulatory Visit: Payer: Self-pay

## 2023-06-23 ENCOUNTER — Emergency Department (HOSPITAL_COMMUNITY)

## 2023-06-23 ENCOUNTER — Ambulatory Visit: Payer: Self-pay

## 2023-06-23 ENCOUNTER — Emergency Department (HOSPITAL_COMMUNITY)
Admission: EM | Admit: 2023-06-23 | Discharge: 2023-06-23 | Disposition: A | Discharge status: Home / Self Care | Attending: Emergency Medicine | Admitting: Emergency Medicine

## 2023-06-23 DIAGNOSIS — R42 Dizziness and giddiness: Secondary | ICD-10-CM | POA: Insufficient documentation

## 2023-06-23 DIAGNOSIS — I10 Essential (primary) hypertension: Secondary | ICD-10-CM | POA: Diagnosis not present

## 2023-06-23 DIAGNOSIS — R9082 White matter disease, unspecified: Secondary | ICD-10-CM | POA: Diagnosis not present

## 2023-06-23 DIAGNOSIS — Z7982 Long term (current) use of aspirin: Secondary | ICD-10-CM | POA: Insufficient documentation

## 2023-06-23 DIAGNOSIS — R0989 Other specified symptoms and signs involving the circulatory and respiratory systems: Secondary | ICD-10-CM | POA: Diagnosis not present

## 2023-06-23 DIAGNOSIS — J320 Chronic maxillary sinusitis: Secondary | ICD-10-CM | POA: Diagnosis not present

## 2023-06-23 LAB — URINALYSIS, ROUTINE W REFLEX MICROSCOPIC
Bilirubin Urine: NEGATIVE
Glucose, UA: NEGATIVE mg/dL
Hgb urine dipstick: NEGATIVE
Ketones, ur: NEGATIVE mg/dL
Leukocytes,Ua: NEGATIVE
Nitrite: NEGATIVE
Protein, ur: NEGATIVE mg/dL
Specific Gravity, Urine: 1.011 (ref 1.005–1.030)
pH: 6 (ref 5.0–8.0)

## 2023-06-23 LAB — COMPREHENSIVE METABOLIC PANEL WITH GFR
ALT: 10 U/L (ref 0–44)
AST: 19 U/L (ref 15–41)
Albumin: 2.7 g/dL — ABNORMAL LOW (ref 3.5–5.0)
Alkaline Phosphatase: 60 U/L (ref 38–126)
Anion gap: 8 (ref 5–15)
BUN: 14 mg/dL (ref 8–23)
CO2: 26 mmol/L (ref 22–32)
Calcium: 8.6 mg/dL — ABNORMAL LOW (ref 8.9–10.3)
Chloride: 103 mmol/L (ref 98–111)
Creatinine, Ser: 0.95 mg/dL (ref 0.61–1.24)
GFR, Estimated: >60 mL/min (ref >60)
Glucose, Bld: 132 mg/dL — ABNORMAL HIGH (ref 70–99)
Potassium: 4.1 mmol/L (ref 3.5–5.1)
Sodium: 137 mmol/L (ref 135–145)
Total Bilirubin: 1 mg/dL (ref 0.0–1.2)
Total Protein: 6.1 g/dL — ABNORMAL LOW (ref 6.5–8.1)

## 2023-06-23 LAB — CBC WITH DIFFERENTIAL/PLATELET
Abs Immature Granulocytes: 0 10*3/uL (ref 0.00–0.07)
Basophils Absolute: 0.1 10*3/uL (ref 0.0–0.1)
Basophils Relative: 1 %
Eosinophils Absolute: 0.3 10*3/uL (ref 0.0–0.5)
Eosinophils Relative: 6 %
HCT: 41.5 % (ref 39.0–52.0)
Hemoglobin: 13.3 g/dL (ref 13.0–17.0)
Immature Granulocytes: 0 %
Lymphocytes Relative: 18 %
Lymphs Abs: 0.8 10*3/uL (ref 0.7–4.0)
MCH: 29.8 pg (ref 26.0–34.0)
MCHC: 32 g/dL (ref 30.0–36.0)
MCV: 93 fL (ref 80.0–100.0)
Monocytes Absolute: 0.5 10*3/uL (ref 0.1–1.0)
Monocytes Relative: 12 %
Neutro Abs: 2.9 10*3/uL (ref 1.7–7.7)
Neutrophils Relative %: 63 %
Platelets: 194 10*3/uL (ref 150–400)
RBC: 4.46 MIL/uL (ref 4.22–5.81)
RDW: 13.6 % (ref 11.5–15.5)
WBC: 4.5 10*3/uL (ref 4.0–10.5)
nRBC: 0 % (ref 0.0–0.2)

## 2023-06-23 LAB — TROPONIN I (HIGH SENSITIVITY): Troponin I (High Sensitivity): 6 ng/L

## 2023-06-23 MED ORDER — MECLIZINE HCL 25 MG PO TABS: 25.0000 mg | ORAL_TABLET | Freq: Three times a day (TID) | ORAL | 0 refills | Status: DC | PRN

## 2023-06-23 MED ORDER — MECLIZINE HCL 25 MG PO TABS
25.0000 mg | ORAL_TABLET | Freq: Once | ORAL | Status: AC
  Administered 2023-06-23: 25 mg via ORAL
  Filled 2023-06-23: qty 1

## 2023-06-23 NOTE — ED Notes (Signed)
 Cannot urinate at

## 2023-06-23 NOTE — ED Provider Triage Note (Signed)
 Emergency Medicine Provider Triage Evaluation Note  Keith Watkins , a 78 y.o. male  was evaluated in triage.  Pt complains of dizziness that started this morning when he tried to get out of bed.  Feeling of off balance and nausea.  Persisted even when he went to work.  No chest pain or shortness of breath.  Daughter says he is just not himself.  Did take a Robaxin  last night due to some right shoulder blade pain yesterday  Review of Systems  Positive: Dizziness, nausea Negative: Chest pain, shortness of breath, ataxia, unilateral numbness or weakness  Physical Exam  BP 137/60 (BP Location: Right Arm)   Pulse 75   Temp 97.9 F (36.6 C)   Resp 18   Ht 5' 11 (1.803 m)   Wt 124.7 kg   SpO2 97%   BMI 38.35 kg/m  Gen:   Awake, no distress   Resp:  Normal effort  MSK:   Moves extremities without difficulty  Other:  No aphasia, normal strength bilaterally, extraocular movements intact without significant nystagmus  Medical Decision Making  Medically screening exam initiated at 10:25 AM.  Appropriate orders placed.  Keith Watkins was informed that the remainder of the evaluation will be completed by another provider, this initial triage assessment does not replace that evaluation, and the importance of remaining in the ED until their evaluation is complete.     Almond Army, MD 06/23/23 1026

## 2023-06-23 NOTE — Discharge Instructions (Addendum)
 You have symptoms likely due to benign paroxysmal positional vertigo a condition that can cause dizziness.  You may take meclizine as needed for dizziness and follow-up closely with your doctor for further care.  Follow instruction below.

## 2023-06-23 NOTE — Telephone Encounter (Signed)
 Pt states that he woke up swimmy headed and nauseous and off balance.  Advised to go to the ED.   FYI Only or Action Required?: FYI only for provider.  Patient was last seen in primary care on 09/02/2022 by [name], MD. Called Nurse Triage reporting Dizziness. Symptoms began today. Interventions attempted: Nothing. Symptoms are: unchanged.  Triage Disposition: Go to ED or PCP/Alternative with Approval, Go to ED Now  Patient/caregiver understands and will follow disposition?: Yes, will follow disposition  Copied from CRM [id number]. Topic: Clinical - Red Word Triage >> Jun 23, 2023  8:00 AM Keith Watkins wrote: Red Word that prompted transfer to Nurse Triage: Patient woke up nauseated and swimmy, he also cannot keep his balance. Could not get out of bed and when he turns head feels sick to stomach. Reason for Disposition  [1] Dizziness (vertigo) present now AND [2] age > 76   (Exception: Prior doctor or NP/PA evaluation for this AND no different/worse than usual.)  Protocols used: Dizziness - Vertigo-A-AH

## 2023-06-23 NOTE — ED Provider Notes (Signed)
 [facility] AT [facility] Provider Note   CSN: [id number] Arrival date & time: 06/23/23  4098     Patient presents with: Dizziness and Nausea   ED E Mcnulty is a 78 y.o. male.   The history is provided by the patient and medical records. No language interpreter was used.  Dizziness    78 year old male history of spinal stenosis, hypertension, obesity presenting with complaint of lightheadedness.  Patient reports last night he felt fine going to bed.  This morning he woke up feeling fine however as soon as he stood up from his bed he became very dizzy and he described as room spinning sensation.  It took him a while to get himself more steady but as he stood up from the bed he fell back due to having bouts of dizziness again.  Eventually it subsided and he was able to use his bathroom and went to work.  Once he got to work after he was driving for approximately 20 minutes he was noted to slumped over inside his seat and his coworker noticed that and recommend patient to go home and rest.  He did walk inside his workplace and drink some coffee and went home.  Patient states he drove home and he was sitting in his car to gather himself before going in but the next he knows his family member was around his car.  They felt that he was not himself.  Patient denies passing out but states he just felt dizzy.  He also did endorse some mild nausea but now most of his symptom has subsided.  He did recall having some right shoulder pain yesterday as if he slept on his shoulder wrong.  He did take a muscle relaxant around 6 PM yesterday and felt little bit better.  He does not endorse any associate chest pain or trouble breathing no fever chills denies any focal numbness or focal weakness.  He is not on any blood thinner medication.  He denies any significant cardiac history and has not had a cardiac stress test in quite a while.  He denies tobacco use.  He denies any exertional  chest pain.  Prior to Admission medications   Medication Sig Start Date End Date Taking? Authorizing Provider  aspirin  81 MG chewable tablet Chew 1 tablet (81 mg total) by mouth 2 (two) times daily. 02/02/23   Arnie Lao, MD  chlorhexidine  (HIBICLENS ) 4 % external liquid Apply 15 mLs (1 Application total) topically as directed for 30 doses. Use as directed daily for 5 days every other week for 6 weeks. 02/01/23   Arnie Lao, MD  gabapentin  (NEURONTIN ) 100 MG capsule Take 1 capsule (100 mg total) by mouth 3 (three) times daily as needed (for burning pain). 02/03/23   Arnie Lao, MD  methocarbamol  (ROBAXIN ) 500 MG tablet Take 1 tablet (500 mg total) by mouth every 6 (six) hours as needed for muscle spasms. 04/04/23   Arnie Lao, MD  oxybutynin  (DITROPAN -XL) 10 MG 24 hr tablet TAKE 1 TABLET (10 MG TOTAL) BY MOUTH IN THE MORNING AND AT BEDTIME 02/04/23   [name], MD  oxyCODONE  (OXY IR/ROXICODONE ) 5 MG immediate release tablet Take 1 tablet (5 mg total) by mouth 3 (three) times daily as needed for moderate pain (pain score 4-6) (pain score 4-6). 04/04/23   Arnie Lao, MD  Pediatric Multivit-Minerals [facility] COMPLETE) CHEW Chew 1 tablet by mouth daily.    [provider]  tamsulosin  (FLOMAX ) 0.4 MG CAPS capsule TAKE 1 CAPSULE BY MOUTH EVERYDAY AT BEDTIME 06/21/23   [name], MD    Allergies: Patient has no known allergies.    Review of Systems  Neurological:  Positive for dizziness.  All other systems reviewed and are negative.   Updated Vital Signs BP 137/60 (BP Location: Right Arm)   Pulse 75   Temp 97.9 F (36.6 C)   Resp 18   Ht 5' 11 (1.803 m)   Wt 124.7 kg   SpO2 97%   BMI 38.35 kg/m   Physical Exam Constitutional:      General: He is not in acute distress.    Appearance: He is well-developed. He is obese.  HENT:     Head: Atraumatic.   Eyes:     Extraocular Movements: Extraocular  movements intact.     Conjunctiva/sclera: Conjunctivae normal.     Pupils: Pupils are equal, round, and reactive to light.     Comments: No nystagmus   Cardiovascular:     Rate and Rhythm: Normal rate and regular rhythm.     Pulses: Normal pulses.     Heart sounds: Normal heart sounds.  Pulmonary:     Effort: Pulmonary effort is normal.     Breath sounds: Normal breath sounds.  Abdominal:     Palpations: Abdomen is soft.     Tenderness: There is no abdominal tenderness.   Musculoskeletal:        General: Normal range of motion.     Cervical back: Normal range of motion and neck supple.   Skin:    Findings: No rash.   Neurological:     Mental Status: He is alert and oriented to person, place, and time.     Comments: Neurologic exam:  Speech clear, pupils equal round reactive to light, extraocular movements intact  Normal peripheral visual fields Cranial nerves III through XII normal including no facial droop Follows commands, moves all extremities x4, normal strength to bilateral upper and lower extremities at all major muscle groups including grip Sensation normal to light touch  Coordination intact, no limb ataxia, finger-nose-finger normal Rapid alternating movements normal No pronator drift Gait normal     (all labs ordered are listed, but only abnormal results are displayed) Labs Reviewed  COMPREHENSIVE METABOLIC PANEL WITH GFR - Abnormal; Notable for the following components:      Result Value   Glucose, Bld 132 (*)    Calcium 8.6 (*)    Total Protein 6.1 (*)    Albumin 2.7 (*)    All other components within normal limits  CBC WITH DIFFERENTIAL/PLATELET  URINALYSIS, ROUTINE W REFLEX MICROSCOPIC  TROPONIN I (HIGH SENSITIVITY)  TROPONIN I (HIGH SENSITIVITY)    EKG: EKG Interpretation Date/Time:  Thursday June 23 2023 08:47:04 EDT Ventricular Rate:  81 PR Interval:  154 QRS Duration:  82 QT Interval:  404 QTC Calculation: 469 R Axis:   -33  Text  Interpretation: Normal sinus rhythm Left axis deviation Abnormal ECG When compared with ECG of 27-Jan-2023 15:01, PREVIOUS ECG IS PRESENT Confirmed by Angela Kell [id number]) on 06/23/2023 12:07:19 PM ED ECG REPORT   Date: 06/23/2023  Rate: 81  Rhythm: normal sinus rhythm  QRS Axis: left  Intervals: normal  ST/T Wave abnormalities: nonspecific ST changes  Conduction Disutrbances:none  Narrative Interpretation:   Old EKG Reviewed: unchanged  I have personally reviewed the EKG tracing and agree with the computerized printout as noted.   Radiology: MR BRAIN WO  CONTRAST Result Date: 06/23/2023 CLINICAL DATA:  Syncope/presyncope, cerebrovascular cause suspected EXAM: MRI HEAD WITHOUT CONTRAST TECHNIQUE: Multiplanar, multiecho pulse sequences of the brain and surrounding structures were obtained without intravenous contrast. COMPARISON:  CT of the head dated 02/23/2023. FINDINGS: Brain: There is mild to moderate periventricular and deep cerebral white matter disease present. The brain otherwise appears normal. There is no evidence of hemorrhage, mass, cortical infarct or hydrocephalus. Vascular: Normal vascular flow voids. Skull and upper cervical spine: Normal bone marrow signal. Sinuses/Orbits: Normal orbits. Mild circumferential mucosal disease within the left maxillary sinus. Other: Blooming artifact associated with left dental amalgam. IMPRESSION: 1. Mild to moderate periventricular and deep cerebral white matter disease. No apparent acute process. Electronically Signed   By: Maribeth Shivers M.D.   On: 06/23/2023 13:59   CT Head Wo Contrast Result Date: 06/23/2023 CLINICAL DATA:  Peripheral vertigo. EXAM: CT HEAD WITHOUT CONTRAST TECHNIQUE: Contiguous axial images were obtained from the base of the skull through the vertex without intravenous contrast. RADIATION DOSE REDUCTION: This exam was performed according to the departmental dose-optimization program which includes automated exposure  control, adjustment of the mA and/or kV according to patient size and/or use of iterative reconstruction technique. COMPARISON:  None Available. FINDINGS: Brain: Normal brain. No evidence of hemorrhage, mass, cortical infarct or hydrocephalus. Vascular: Skull: Moderate calcific atheromatous disease within the carotid siphons. Intact and unremarkable. Sinuses/Orbits: No acute process. Other: None. IMPRESSION: Negative. Electronically Signed   By: Maribeth Shivers M.D.   On: 06/23/2023 11:40   DG Chest 2 View Result Date: 06/23/2023 CLINICAL DATA:  Dizziness. EXAM: CHEST - 2 VIEW COMPARISON:  None Available. FINDINGS: Low lung volumes. The heart size and mediastinal contours are within normal limits. No focal consolidation, pleural effusion, or pneumothorax. Surgical clips project over the upper abdomen. No acute osseous abnormality. IMPRESSION: Low lung volumes.  No acute cardiopulmonary findings. Electronically Signed   By: Mannie Seek M.D.   On: 06/23/2023 09:29     Procedures   Medications Ordered in the ED  meclizine (ANTIVERT) tablet 25 mg (25 mg Oral Given 06/23/23 1222)    Clinical Course as of 06/23/23 1511  Thu Jun 23, 2023  1501 FU on trop and dc home. BPPV, room spinning today. Looks good, ambulatory, MRI neg. No CP [BH]    Clinical Course User Index [BH] Henderly, Britni A, PA-C                                 Medical Decision Making Amount and/or Complexity of Data Reviewed Labs: ordered. Radiology: ordered.   BP 137/60 (BP Location: Right Arm)   Pulse 75   Temp 97.9 F (36.6 C)   Resp 18   Ht 5' 11 (1.803 m)   Wt 124.7 kg   SpO2 97%   BMI 38.35 kg/m   52:71 AM  78 year old male history of spinal stenosis, hypertension, obesity presenting with complaint of lightheadedness.  Patient reports last night he felt fine going to bed.  This morning he woke up feeling fine however as soon as he stood up from his bed he became very dizzy and he described as room  spinning sensation.  It took him a while to get himself more steady but as he stood up from the bed he fell back due to having bouts of dizziness again.  Eventually it subsided and he was able to use his bathroom and went to work.  Once he got to work after he was driving for approximately 20 minutes he was noted to slumped over inside his seat and his coworker noticed that and recommend patient to go home and rest.  He did walk inside his workplace and drink some coffee and went home.  Patient states he drove home and he was sitting in his car to gather himself before going in but the next he knows his family member was around his car.  They felt that he was not himself.  Patient denies passing out but states he just felt dizzy.  He also did endorse some mild nausea but now most of his symptom has subsided.  He did recall having some right shoulder pain yesterday as if he slept on his shoulder wrong.  He did take a muscle relaxant around 6 PM yesterday and felt little bit better.  He does not endorse any associate chest pain or trouble breathing no fever chills denies any focal numbness or focal weakness.  He is not on any blood thinner medication.  He denies any significant cardiac history and has not had a cardiac stress test in quite a while.  He denies tobacco use.  He denies any exertional chest pain.  On exam patient is mentating appropriately appears to be in no acute discomfort.  He does not have any focal neurodeficit.  He is able to ambulate without assist.  He does use a cane.  Heart with normal rate and rhythm, lungs are clear abdomen soft nontender strength are equal to all 4 extremities.    Symptoms are suspicious of BPPV.  Given his age, workup initiated.  Will consider brain MRI to rule out posterior circulation stroke.    -Labs ordered, independently viewed and interpreted by me.  Labs remarkable for reassuring labs, UA without signs of UTI, normal WBC, normal H&H, electrolyte panels are  reassuring, normal renal function.  Troponin considered but not performed as patient without any pain in his chest -The patient was maintained on a cardiac monitor.  I personally viewed and interpreted the cardiac monitored which showed an underlying rhythm of: NSR -Imaging independently viewed and interpreted by me and I agree with radiologist's interpretation.  Result remarkable for brain MRI without concerning finding, head CT scan negative, chest x-ray  unremarkable. -This patient presents to the ED for concern of dizziness, this involves an extensive number of treatment options, and is a complaint that carries with it a high risk of complications and morbidity.  The differential diagnosis includes BPPV, posterior stroke, infection, acs, anemia, electrolytes imbalance, hypoglycemia -Co morbidities that complicate the patient evaluation includes HTN, obesity,  -Treatment includes meclizine -Reevaluation of the patient after these medicines showed that the patient improved -PCP office notes or outside notes reviewed -Discussion with attending Dr. Reba Camper.  Pt sign out to oncoming provider who will f/u on troponin result.  If neg pt can be d/c home -Escalation to admission/observation considered: patients feels much better, is comfortable with discharge, and will follow up with PCP -Prescription medication considered, patient comfortable with meclizine -Social Determinant of Health considered which includes lack of mobility      Final diagnoses:  Vertigo    ED Discharge Orders          Ordered    meclizine (ANTIVERT) 25 MG tablet  3 times daily PRN        06/23/23 1511               Debbra Fairy, PA-C 06/23/23  1513    Burnette Carte, MD 06/23/23 1544

## 2023-06-23 NOTE — ED Provider Notes (Signed)
 Care assumed from previous provider.  See note for full HPI.  78 year old here for evaluation of vertiginous symptoms.  Ambulatory here.  Tolerating p.o. intake.  MRI and CT scan unremarkable.  Plan to follow-up on troponin per previous provider.  He denies any chest pain or shortness of breath.  No syncope. Physical Exam  BP 138/71 (BP Location: Right Arm)   Pulse 70   Temp 97.9 F (36.6 C)   Resp 18   Ht 5' 11 (1.803 m)   Wt 124.7 kg   SpO2 98%   BMI 38.35 kg/m   Physical Exam Vitals and nursing note reviewed.  Constitutional:      General: He is not in acute distress.    Appearance: He is well-developed. He is not ill-appearing or diaphoretic.  HENT:     Head: Atraumatic.   Eyes:     Pupils: Pupils are equal, round, and reactive to light.    Cardiovascular:     Rate and Rhythm: Normal rate and regular rhythm.  Pulmonary:     Effort: Pulmonary effort is normal. No respiratory distress.  Abdominal:     General: There is no distension.     Palpations: Abdomen is soft.   Musculoskeletal:        General: Normal range of motion.     Cervical back: Normal range of motion and neck supple.     Comments: Moves all 4 extremities   Skin:    General: Skin is warm and dry.   Neurological:     General: No focal deficit present.     Mental Status: He is alert and oriented to person, place, and time.     Comments: Ambulatory, uses cane due to prior knee surgery    Procedures  Procedures  ED Course / MDM   Clinical Course as of 06/23/23 1550  Thu Jun 23, 2023  1501 FU on trop and dc home. BPPV, room spinning today. Looks good, ambulatory, MRI neg. No CP [BH]    Clinical Course User Index [BH] Kasean Denherder A, PA-C   Care assumed from previous provider.  See note for full HPI.  78 year old here for evaluation of vertiginous symptoms.  Ambulatory here.  Tolerating p.o. intake.  MRI and CT scan unremarkable.  Plan to follow-up on troponin per previous provider.  He  denies any chest pain or shortness of breath.  No syncope.  Labs and imaging personally viewed interpreted  Patient reassessed.  Feels improved.  States he has ambulated multiple times with any dizziness, nausea.  We went over labs and imaging with patient and family in room.  Nonfocal neuroexam.  Reassuring workup.  Symptoms sound vertiginous.  Previous Clinical research associate had sent in a prescription for meclizine.  Will have him follow-up outpatient, return for new or worsening symptoms.  Low suspicion for CVA, dissection, unstable angina, electrolyte abnormality, arrhythmia, bleed, traumatic injury, AAA  The patient has been appropriately medically screened and/or stabilized in the ED. I have low suspicion for any other emergent medical condition which would require further screening, evaluation or treatment in the ED or require inpatient management.  Patient is hemodynamically stable and in no acute distress.  Patient able to ambulate in department prior to ED.  Evaluation does not show acute pathology that would require ongoing or additional emergent interventions while in the emergency department or further inpatient treatment.  I have discussed the diagnosis with the patient and answered all questions.  Pain is been managed while in the emergency department  and patient has no further complaints prior to discharge.  Patient is comfortable with plan discussed in room and is stable for discharge at this time.  I have discussed strict return precautions for returning to the emergency department.  Patient was encouraged to follow-up with PCP/specialist refer to at discharge.    Medical Decision Making Amount and/or Complexity of Data Reviewed External Data Reviewed: labs, radiology, ECG and notes. Labs: ordered. Decision-making details documented in ED Course. Radiology: ordered and independent interpretation performed. Decision-making details documented in ED Course. ECG/medicine tests: ordered and  independent interpretation performed. Decision-making details documented in ED Course.  Risk OTC drugs. Prescription drug management. Parenteral controlled substances. Decision regarding hospitalization.          Shelda Truby A, PA-C 06/23/23 1550    Lowery Rue, DO 06/23/23 2247

## 2023-06-23 NOTE — ED Triage Notes (Signed)
 Pt. Stated, I got up around 630 and tried to get up and fell back in the bed and felt nausea. When I tried to get up again I fell back down again and I felt swimmy headed. I went onto work and felt the same and still felt the same. She called the Dr. And they said to come here. Daughter stated when he got home he was just sitting there in his truck. Last night he said the night before sleep wrong and took a muscle relaxant.

## 2023-07-20 ENCOUNTER — Ambulatory Visit

## 2023-07-20 VITALS — Ht 71.0 in | Wt 275.0 lb

## 2023-07-20 DIAGNOSIS — Z0000 Encounter for general adult medical examination without abnormal findings: Secondary | ICD-10-CM

## 2023-07-20 NOTE — Patient Instructions (Signed)
 Keith Watkins , Thank you for taking time out of your busy schedule to complete your Annual Wellness Visit with me. I enjoyed our conversation and look forward to speaking with you again next year. I, as well as your care team,  appreciate your ongoing commitment to your health goals. Please review the following plan we discussed and let me know if I can assist you in the future. Your Game plan/ To Do List    Follow up Visits: Next Medicare AWV with our clinical staff: In 1 year    Have you seen your provider in the last 6 months (3 months if uncontrolled diabetes)? No Next Office Visit with your provider: To be scheduled   Clinician Recommendations:  Aim for 30 minutes of exercise or brisk walking, 6-8 glasses of water, and 5 servings of fruits and vegetables each day.       This is a list of the screening recommended for you and due dates:  Health Maintenance  Topic Date Due   COVID-19 Vaccine (4 - 2024-25 season) 09/05/2022   Flu Shot  08/05/2023   Medicare Annual Wellness Visit  07/19/2024   Pneumococcal Vaccine for age over 14  Completed   Hepatitis C Screening  Completed   Zoster (Shingles) Vaccine  Completed   Hepatitis B Vaccine  Aged Out   HPV Vaccine  Aged Out   Meningitis B Vaccine  Aged Out   DTaP/Tdap/Td vaccine  Discontinued   Colon Cancer Screening  Discontinued    Advanced directives: (ACP Link)Information on Advanced Care Planning can be found at [place]  Secretary of [facility] Advance Health Care Directives Advance Health Care Directives. [email or web address]   Advance Care Planning is important because it:  [x]  Makes sure you receive the medical care that is consistent with your values, goals, and preferences  [x]  It provides guidance to your family and loved ones and reduces their decisional burden about whether or not they are making the right decisions based on your wishes.  Follow the link provided in your after visit summary or read over the paperwork we have mailed to  you to help you started getting your Advance Directives in place. If you need assistance in completing these, please reach out to us  so that we can help you!  See attachments for Preventive Care and Fall Prevention Tips.

## 2023-07-20 NOTE — Progress Notes (Signed)
 Subjective:   Keith Watkins is a 78 y.o. who presents for a Medicare Wellness preventive visit.  As a reminder, Annual Wellness Visits don't include a physical exam, and some assessments may be limited, especially if this visit is performed virtually. We may recommend an in-person follow-up visit with your provider if needed.  Visit Complete: Virtual I connected with  Keith Watkins on 07/20/23 by a video and audio enabled telemedicine application and verified that I am speaking with the correct person using two identifiers.  Patient Location: Home  Provider Location: Home Office  I discussed the limitations of evaluation and management by telemedicine. The patient expressed understanding and agreed to proceed.  Vital Signs: Because this visit was a virtual/telehealth visit, some criteria may be missing or patient reported. Any vitals not documented were not able to be obtained and vitals that have been documented are patient reported.  Persons Participating in Visit: Patient.  AWV Questionnaire: Yes: Patient Medicare AWV questionnaire was completed by the patient on 07/16/23; I have confirmed that all information answered by patient is correct and no changes since this date.  Cardiac Risk Factors include: advanced age (>50men, >1 women);dyslipidemia;hypertension;male gender     Objective:    Today's Vitals   07/20/23 0833  Weight: 275 lb (124.7 kg)  Height: 5' 11 (1.803 m)   Body mass index is 38.35 kg/m.     07/20/2023    8:36 AM 06/23/2023    8:47 AM 02/16/2023    4:52 PM 01/27/2023    2:30 PM 07/15/2022    2:14 PM 07/02/2021    2:17 PM 05/30/2020    8:58 AM  Advanced Directives  Does Patient Have a Medical Advance Directive? No No Yes Yes No Yes Yes  Type of Surveyor, minerals;Living will Healthcare Power of [place];Living will  Healthcare Power of [place];Living will Healthcare Power of [place];Living will  Does patient want to make changes  to medical advance directive?       No - Patient declined  Copy of Healthcare Power of Attorney in Chart?    No - copy requested  No - copy requested No - copy requested  Would patient like information on creating a medical advance directive? Yes (MAU/Ambulatory/Procedural Areas - Information given)    Yes (MAU/Ambulatory/Procedural Areas - Information given)      Current Medications (verified) Outpatient Encounter Medications as of 07/20/2023  Medication Sig   oxybutynin  (DITROPAN -XL) 10 MG 24 hr tablet TAKE 1 TABLET (10 MG TOTAL) BY MOUTH IN THE MORNING AND AT BEDTIME   Pediatric Multivit-Minerals (FLINTSTONES COMPLETE) CHEW Chew 1 tablet by mouth daily.   tamsulosin  (FLOMAX ) 0.4 MG CAPS capsule TAKE 1 CAPSULE BY MOUTH EVERYDAY AT BEDTIME   [DISCONTINUED] aspirin  81 MG chewable tablet Chew 1 tablet (81 mg total) by mouth 2 (two) times daily.   [DISCONTINUED] chlorhexidine  (HIBICLENS ) 4 % external liquid Apply 15 mLs (1 Application total) topically as directed for 30 doses. Use as directed daily for 5 days every other week for 6 weeks.   [DISCONTINUED] gabapentin  (NEURONTIN ) 100 MG capsule Take 1 capsule (100 mg total) by mouth 3 (three) times daily as needed (for burning pain).   [DISCONTINUED] meclizine  (ANTIVERT ) 25 MG tablet Take 1 tablet (25 mg total) by mouth 3 (three) times daily as needed for dizziness.   [DISCONTINUED] methocarbamol  (ROBAXIN ) 500 MG tablet Take 1 tablet (500 mg total) by mouth every 6 (six) hours as needed for muscle spasms.   [  DISCONTINUED] oxyCODONE  (OXY IR/ROXICODONE ) 5 MG immediate release tablet Take 1 tablet (5 mg total) by mouth 3 (three) times daily as needed for moderate pain (pain score 4-6) (pain score 4-6).   No facility-administered encounter medications on file as of 07/20/2023.    Allergies (verified) Patient has no known allergies.   History: Past Medical History:  Diagnosis Date   Arthritis    Back pain    Cataract    Cellulitis 07/31/2011    Complication of anesthesia    Pt wakes up too quickly   Enlarged prostate    Foot abscess, left 08/07/2011   Hypertension    prior to gastric bypass- no meds taken   Lumbar spinal stenosis    Obesity 08/09/2011   Past Surgical History:  Procedure Laterality Date   APPENDECTOMY  1963   CHOLECYSTECTOMY  1984   GASTRIC FUNDOPLICATION  1982   at [facility]   I & D EXTREMITY  08/07/2011   Procedure: IRRIGATION AND DEBRIDEMENT EXTREMITY;  Surgeon: Norleen Armor, MD;  Location: [facility];  Service: Orthopedics;  Laterality: Left;  irrigation and debridement left foot abscess   JOINT REPLACEMENT  02-03-23   KNEE ARTHROSCOPY Right    TOTAL KNEE ARTHROPLASTY Left 02/01/2023   Procedure: LEFT TOTAL KNEE ARTHROPLASTY;  Surgeon: [name], MD;  Location: [facility] OR;  Service: Orthopedics;  Laterality: Left;   Family History  Problem Relation Age of Onset   Heart disease Mother    Cancer Sister        Breast   Breast cancer Sister    Hypertension Brother    Hypertension Maternal Grandmother    Stroke Maternal Grandmother    Arthritis Maternal Grandmother    Heart disease Maternal Grandfather    Hypertension Maternal Grandfather    Diabetes Sister    Heart disease Sister    Hyperlipidemia Sister    Hypertension Sister    Stroke Sister    Hypertension Paternal Grandmother    Stroke Paternal Grandmother    Diabetes Other    Stroke Other    Hypertension Other    Coronary artery disease Other    Heart failure Other    Colon cancer Neg Hx    Esophageal cancer Neg Hx    Stomach cancer Neg Hx    Rectal cancer Neg Hx    Social History   Socioeconomic History   Marital status: Married    Spouse name: Keith Watkins   Number of children: 3   Years of education: Not on file   Highest education level: Associate degree: occupational, Scientist, product/process development, or vocational program  Occupational History   Not on file  Tobacco Use   Smoking status: Never   Smokeless tobacco: Never  Vaping Use    Vaping status: Never Used  Substance and Sexual Activity   Alcohol use: Never   Drug use: Never   Sexual activity: Not Currently  Other Topics Concern   Not on file  Social History Narrative   3 daughters.   Still working.    Social Drivers of Corporate investment banker Strain: Low Risk  (07/16/2023)   Overall Financial Resource Strain (CARDIA)    Difficulty of Paying Living Expenses: Not hard at all  Food Insecurity: No Food Insecurity (07/16/2023)   Hunger Vital Sign    Worried About Running Out of Food in the Last Year: Never true    Ran Out of Food in the Last Year: Never true  Transportation Needs: No Transportation Needs (  07/16/2023)   PRAPARE - Administrator, Civil Service (Medical): No    Lack of Transportation (Non-Medical): No  Physical Activity: Inactive (07/16/2023)   Exercise Vital Sign    Days of Exercise per Week: 0 days    Minutes of Exercise per Session: 0 min  Stress: No Stress Concern Present (07/16/2023)   [employer] of Occupational Health - Occupational Stress Questionnaire    Feeling of Stress: Not at all  Social Connections: Socially Integrated (07/16/2023)   Social Connection and Isolation Panel    Frequency of Communication with Friends and Family: More than three times a week    Frequency of Social Gatherings with Friends and Family: More than three times a week    Attends Religious Services: More than 4 times per year    Active Member of [employer] or Organizations: Yes    Attends Engineer, structural: More than 4 times per year    Marital Status: Married    Tobacco Counseling Counseling given: Not Answered    Clinical Intake:  Pre-visit preparation completed: Yes  Pain : No/denies pain     Diabetes: No  Lab Results  Component Value Date   HGBA1C 5.2 09/02/2022   HGBA1C 5.6 08/07/2011     How often do you need to have someone help you when you read instructions, pamphlets, or other written materials from  your doctor or pharmacy?: 1 - Never  Interpreter Needed?: No  Information entered by :: Charmaine Bloodgood LPN   Activities of Daily Living     07/20/2023    8:16 AM 01/27/2023    2:40 PM  In your present state of health, do you have any difficulty performing the following activities:  Hearing? 0   Vision? 0   Difficulty concentrating or making decisions? 0   Walking or climbing stairs? 0   Dressing or bathing? 0   Doing errands, shopping? 0 0  Preparing Food and eating ? N   Using the Toilet? N   In the past six months, have you accidently leaked urine? N   Do you have problems with loss of bowel control? N   Managing your Medications? N   Managing your Finances? N   Housekeeping or managing your Housekeeping? N     Patient Care Team: Duanne Butler DASEN, MD as PCP - General (Family Medicine) [name], MD as Consulting Physician (Orthopedic Surgery)  I have updated your Care Teams any recent Medical Services you may have received from other providers in the past year.     Assessment:   This is a routine wellness examination for Matej.  Hearing/Vision screen Hearing Screening - Comments:: Denies hearing difficulties   Vision Screening - Comments:: No vision problems; will schedule routine eye exam soon     Goals Addressed             This Visit's Progress    Patient Stated   On track    I would like to get my left knee repaired      Prevent falls   On track      Depression Screen     07/20/2023    8:35 AM 09/02/2022    8:36 AM 07/15/2022    2:13 PM 01/12/2022    7:54 AM 07/02/2021    2:09 PM 09/16/2020   11:21 AM 05/30/2020    9:06 AM  PHQ 2/9 Scores  PHQ - 2 Score 0 0 0 0 0 0 0  Fall Risk     07/20/2023    8:36 AM 09/02/2022    8:35 AM 07/15/2022    2:15 PM 07/14/2022    5:00 PM 01/12/2022    7:54 AM  Fall Risk   Falls in the past year? 0 0 0 0 1  Number falls in past yr: 0 0 0  1  Injury with Fall? 0 0 0 0 1  Risk for fall due to :  Orthopedic patient Impaired mobility;Impaired balance/gait No Fall Risks  History of fall(s);Impaired balance/gait;Orthopedic patient  Follow up Falls prevention discussed;Education provided;Falls evaluation completed Falls prevention discussed Falls prevention discussed;Education provided;Falls evaluation completed  Education provided;Falls prevention discussed      Data saved with a previous flowsheet row definition    MEDICARE RISK AT HOME:  Medicare Risk at Home Any stairs in or around the home?: No If so, are there any without handrails?: No Home free of loose throw rugs in walkways, pet beds, electrical cords, etc?: Yes Adequate lighting in your home to reduce risk of falls?: Yes Life alert?: No Use of a cane, walker or w/c?: Yes Grab bars in the bathroom?: Yes Shower chair or bench in shower?: No Elevated toilet seat or a handicapped toilet?: Yes  TIMED UP AND GO:  Was the test performed?  No  Cognitive Function: 6CIT completed        07/20/2023    8:36 AM 07/15/2022    2:16 PM 07/02/2021    2:17 PM 07/27/2019    8:37 AM  6CIT Screen  What Year? 0 points 0 points 0 points 0 points  What month? 0 points 0 points 0 points 0 points  What time? 0 points 0 points 0 points 0 points  Count back from 20 0 points 0 points 0 points 0 points  Months in reverse 0 points 0 points 0 points 0 points  Repeat phrase 0 points 0 points 0 points 0 points  Total Score 0 points 0 points 0 points 0 points    Immunizations Immunization History  Administered Date(s) Administered   Fluad Quad(high Dose 65+) 09/27/2018, 10/02/2021   Influenza,inj,Quad PF,6+ Mos 02/06/2013, 02/14/2014   Influenza-Unspecified 01/20/2017   PFIZER Comirnaty(Gray Top)Covid-19 Tri-Sucrose Vaccine 10/02/2021   PFIZER(Purple Top)SARS-COV-2 Vaccination 02/17/2019, 03/14/2019   Pneumococcal Conjugate-13 02/06/2013   Pneumococcal Polysaccharide-23 08/09/2011   Respiratory Syncytial Virus Vaccine,Recomb  Aduvanted(Arexvy) 10/02/2021   Zoster Recombinant(Shingrix ) 09/02/2022, 11/02/2022   Zoster, Live 03/04/2014    Screening Tests Health Maintenance  Topic Date Due   COVID-19 Vaccine (4 - 2024-25 season) 09/05/2022   INFLUENZA VACCINE  08/05/2023   Medicare Annual Wellness (AWV)  07/19/2024   Pneumococcal Vaccine: 50+ Years  Completed   Hepatitis C Screening  Completed   Zoster Vaccines- Shingrix   Completed   Hepatitis B Vaccines  Aged Out   HPV VACCINES  Aged Out   Meningococcal B Vaccine  Aged Out   DTaP/Tdap/Td  Discontinued   Colonoscopy  Discontinued    Health Maintenance  Health Maintenance Due  Topic Date Due   COVID-19 Vaccine (4 - 2024-25 season) 09/05/2022    Additional Screening:  Vision Screening: Recommended annual ophthalmology exams for early detection of glaucoma and other disorders of the eye. Would you like a referral to an eye doctor? No    Dental Screening: Recommended annual dental exams for proper oral hygiene  Community Resource Referral / Chronic Care Management: CRR required this visit?  No   CCM required this visit?  No  Plan:    I have personally reviewed and noted the following in the patient's chart:   Medical and social history Use of alcohol, tobacco or illicit drugs  Current medications and supplements including opioid prescriptions. Patient is not currently taking opioid prescriptions. Functional ability and status Nutritional status Physical activity Advanced directives List of other physicians Hospitalizations, surgeries, and ER visits in previous 12 months Vitals Screenings to include cognitive, depression, and falls Referrals and appointments  In addition, I have reviewed and discussed with patient certain preventive protocols, quality metrics, and best practice recommendations. A written personalized care plan for preventive services as well as general preventive health recommendations were provided to  patient.   Lavelle Pfeiffer [facility], [place]   2/83/7974   After Visit Summary: (MyChart) Due to this being a telephonic visit, the after visit summary with patients personalized plan was offered to patient via MyChart   Notes: Nothing significant to report at this time.

## 2023-07-26 ENCOUNTER — Other Ambulatory Visit: Payer: Self-pay | Admitting: Family Medicine

## 2023-07-28 NOTE — Telephone Encounter (Signed)
 OV 09/02/22 Requested Prescriptions  Pending Prescriptions Disp Refills   oxybutynin  (DITROPAN -XL) 10 MG 24 hr tablet [Pharmacy Med Name: OXYBUTYNIN  CL ER 10 MG TABLET] 180 tablet 1    Sig: TAKE 1 TABLET (10 MG TOTAL) BY MOUTH IN THE MORNING AND AT BEDTIME     Urology:  Bladder Agents Passed - 07/28/2023  9:21 AM      Passed - Valid encounter within last 12 months    Recent Outpatient Visits           10 months ago General medical exam   [place] [facility] Duanne Butler DASEN, MD   1 year ago Joint laxity of left knee   [place] [facility] Duanne Butler DASEN, MD   1 year ago Morbid obesity with BMI of 45.0-49.9, adult [facility])   [place] [facility] Duanne Butler DASEN, MD   2 years ago Morbid obesity with BMI of 45.0-49.9, adult [facility])   [place] [facility] Duanne Butler DASEN, MD   10 years ago Hip pain, acute, right   [facility], Jaynie SQUIBB, DO       Future Appointments             In 2 months [name], MD [facility]

## 2023-09-17 ENCOUNTER — Other Ambulatory Visit: Payer: Self-pay | Admitting: Family Medicine

## 2023-09-17 DIAGNOSIS — N3941 Urge incontinence: Secondary | ICD-10-CM

## 2023-09-19 NOTE — Telephone Encounter (Signed)
 Requested Prescriptions  Pending Prescriptions Disp Refills   tamsulosin  (FLOMAX ) 0.4 MG CAPS capsule [Pharmacy Med Name: TAMSULOSIN  HCL 0.4 MG CAPSULE] 90 capsule 0    Sig: TAKE 1 CAPSULE BY MOUTH EVERYDAY AT BEDTIME     Urology: Alpha-Adrenergic Blocker Failed - 09/19/2023  3:21 PM      Failed - PSA in normal range and within 360 days    PSA  Date Value Ref Range Status  01/07/2022 0.31 < OR = 4.00 ng/mL Final    Comment:    The total PSA value from this assay system is  standardized against the WHO standard. The test  result will be approximately 20% lower when compared  to the equimolar-standardized total PSA (Beckman  Coulter). Comparison of serial PSA results should be  interpreted with this fact in mind. . This test was performed using the Siemens  chemiluminescent method. Values obtained from  different assay methods cannot be used interchangeably. PSA levels, regardless of value, should not be interpreted as absolute evidence of the presence or absence of disease.          Failed - Last BP in normal range    BP Readings from Last 1 Encounters:  06/23/23 (!) 143/70         Passed - Valid encounter within last 12 months    Recent Outpatient Visits           1 year ago General medical exam   [place] [facility] Duanne Butler DASEN, MD   1 year ago Joint laxity of left knee   [place] [facility] Duanne Butler DASEN, MD   2 years ago Morbid obesity with BMI of 45.0-49.9, adult [facility])   [place] [facility] Duanne Butler DASEN, MD   2 years ago Morbid obesity with BMI of 45.0-49.9, adult [facility])   [place] [facility] Duanne Butler DASEN, MD   10 years ago Hip pain, acute, right   [facility] Jaynie SHAUNNA, DO       Future Appointments             In 2 weeks [name], MD [facility]

## 2023-09-30 ENCOUNTER — Other Ambulatory Visit

## 2023-09-30 DIAGNOSIS — Z6841 Body Mass Index (BMI) 40.0 and over, adult: Secondary | ICD-10-CM | POA: Diagnosis not present

## 2023-09-30 DIAGNOSIS — Z125 Encounter for screening for malignant neoplasm of prostate: Secondary | ICD-10-CM | POA: Diagnosis not present

## 2023-09-30 DIAGNOSIS — Z131 Encounter for screening for diabetes mellitus: Secondary | ICD-10-CM | POA: Diagnosis not present

## 2023-10-01 LAB — CBC WITH DIFFERENTIAL/PLATELET
Absolute Lymphocytes: 1372 {cells}/uL (ref 850–3900)
Absolute Monocytes: 645 {cells}/uL (ref 200–950)
Basophils Absolute: 69 {cells}/uL (ref 0–200)
Basophils Relative: 1.6 %
Eosinophils Absolute: 507 {cells}/uL — ABNORMAL HIGH (ref 15–500)
Eosinophils Relative: 11.8 %
HCT: 39.4 % (ref 38.5–50.0)
Hemoglobin: 12.9 g/dL — ABNORMAL LOW (ref 13.2–17.1)
MCH: 30.4 pg (ref 27.0–33.0)
MCHC: 32.7 g/dL (ref 32.0–36.0)
MCV: 92.9 fL (ref 80.0–100.0)
MPV: 10.4 fL (ref 7.5–12.5)
Monocytes Relative: 15 %
Neutro Abs: 1707 {cells}/uL (ref 1500–7800)
Neutrophils Relative %: 39.7 %
Platelets: 180 Thousand/uL (ref 140–400)
RBC: 4.24 Million/uL (ref 4.20–5.80)
RDW: 12.7 % (ref 11.0–15.0)
Total Lymphocyte: 31.9 %
WBC: 4.3 Thousand/uL (ref 3.8–10.8)

## 2023-10-01 LAB — COMPREHENSIVE METABOLIC PANEL WITH GFR
AG Ratio: 1.2 (calc) (ref 1.0–2.5)
ALT: 5 U/L — ABNORMAL LOW (ref 9–46)
AST: 16 U/L (ref 10–35)
Albumin: 3 g/dL — ABNORMAL LOW (ref 3.6–5.1)
Alkaline phosphatase (APISO): 59 U/L (ref 35–144)
BUN: 18 mg/dL (ref 7–25)
CO2: 30 mmol/L (ref 20–32)
Calcium: 8.5 mg/dL — ABNORMAL LOW (ref 8.6–10.3)
Chloride: 105 mmol/L (ref 98–110)
Creat: 0.94 mg/dL (ref 0.70–1.28)
Globulin: 2.5 g/dL (ref 1.9–3.7)
Glucose, Bld: 90 mg/dL (ref 65–99)
Potassium: 4.5 mmol/L (ref 3.5–5.3)
Sodium: 139 mmol/L (ref 135–146)
Total Bilirubin: 0.7 mg/dL (ref 0.2–1.2)
Total Protein: 5.5 g/dL — ABNORMAL LOW (ref 6.1–8.1)
eGFR: 83 mL/min/1.73m2 (ref >=60)

## 2023-10-01 LAB — PSA: PSA: 0.36 ng/mL (ref <=4.00)

## 2023-10-01 LAB — LIPID PANEL
Cholesterol: 110 mg/dL (ref <200)
HDL: 37 mg/dL — ABNORMAL LOW (ref >=40)
LDL Cholesterol (Calc): 60 mg/dL
Non-HDL Cholesterol (Calc): 73 mg/dL (ref <130)
Total CHOL/HDL Ratio: 3 (calc) (ref <5.0)
Triglycerides: 52 mg/dL (ref <150)

## 2023-10-01 LAB — HEMOGLOBIN A1C
Hgb A1c MFr Bld: 5.1 % (ref <5.7)
Mean Plasma Glucose: 100 mg/dL
eAG (mmol/L): 5.5 mmol/L

## 2023-10-03 ENCOUNTER — Ambulatory Visit: Payer: Self-pay | Admitting: Family Medicine

## 2023-10-04 ENCOUNTER — Encounter: Payer: Self-pay | Admitting: Family Medicine

## 2023-10-04 ENCOUNTER — Ambulatory Visit (INDEPENDENT_AMBULATORY_CARE_PROVIDER_SITE_OTHER): Admitting: Family Medicine

## 2023-10-04 VITALS — BP 112/64 | HR 75 | Ht 71.0 in | Wt 274.0 lb

## 2023-10-04 DIAGNOSIS — Z6841 Body Mass Index (BMI) 40.0 and over, adult: Secondary | ICD-10-CM

## 2023-10-04 DIAGNOSIS — Z23 Encounter for immunization: Secondary | ICD-10-CM | POA: Diagnosis not present

## 2023-10-04 DIAGNOSIS — Z1211 Encounter for screening for malignant neoplasm of colon: Secondary | ICD-10-CM

## 2023-10-04 DIAGNOSIS — Z0001 Encounter for general adult medical examination with abnormal findings: Secondary | ICD-10-CM

## 2023-10-04 DIAGNOSIS — Z0000 Encounter for general adult medical examination without abnormal findings: Secondary | ICD-10-CM

## 2023-10-04 NOTE — Progress Notes (Signed)
 Subjective:    Patient ID: Keith Watkins, male    DOB: 1945-06-20, 78 y.o.   MRN: [id number]  HPI Patient is a 78 year old Caucasian gentleman with a history of morbid obesity here for CPE.  .  Colonoscopy was 2016 and recommended repeat in 2026.  He would like to get Cologuard.  He is due for a flu shot.  Patient has lost considerable weight.  He has had his left knee replaced.  He is planning to have his right knee replaced.  Otherwise he feels great.  He denies any chest pain shortness of breath or dyspnea on exertion.  Immunizations are up-to-date. Immunization History  Administered Date(s) Administered   Fluad Quad(high Dose 65+) 09/27/2018, 10/02/2021   Influenza,inj,Quad PF,6+ Mos 02/06/2013, 02/14/2014   Influenza-Unspecified 01/20/2017   PFIZER Comirnaty(Gray Top)Covid-19 Tri-Sucrose Vaccine 10/02/2021   PFIZER(Purple Top)SARS-COV-2 Vaccination 02/17/2019, 03/14/2019   Pneumococcal Conjugate-13 02/06/2013   Pneumococcal Polysaccharide-23 08/09/2011   Respiratory Syncytial Virus Vaccine,Recomb Aduvanted(Arexvy) 10/02/2021   Zoster Recombinant(Shingrix ) 09/02/2022, 11/02/2022   Zoster, Live 03/04/2014   Lab on 09/30/2023  Component Date Value Ref Range Status   WBC 09/30/2023 4.3  3.8 - 10.8 Thousand/uL Final   RBC 09/30/2023 4.24  4.20 - 5.80 Million/uL Final   Hemoglobin 09/30/2023 12.9 (L)  13.2 - 17.1 g/dL Final   HCT 90/73/7974 39.4  38.5 - 50.0 % Final   MCV 09/30/2023 92.9  80.0 - 100.0 fL Final   MCH 09/30/2023 30.4  27.0 - 33.0 pg Final   MCHC 09/30/2023 32.7  32.0 - 36.0 g/dL Final   Comment: For adults, a slight decrease in the calculated MCHC value (in the range of 30 to 32 g/dL) is most likely not clinically significant; however, it should be interpreted with caution in correlation with other red cell parameters and the patient's clinical condition.    RDW 09/30/2023 12.7  11.0 - 15.0 % Final   Platelets 09/30/2023 180  140 - 400 Thousand/uL Final   MPV  09/30/2023 10.4  7.5 - 12.5 fL Final   Neutro Abs 09/30/2023 1,707  1,500 - 7,800 cells/uL Final   Absolute Lymphocytes 09/30/2023 1,372  850 - 3,900 cells/uL Final   Absolute Monocytes 09/30/2023 645  200 - 950 cells/uL Final   Eosinophils Absolute 09/30/2023 507 (H)  15 - 500 cells/uL Final   Basophils Absolute 09/30/2023 69  0 - 200 cells/uL Final   Neutrophils Relative % 09/30/2023 39.7  % Final   Total Lymphocyte 09/30/2023 31.9  % Final   Monocytes Relative 09/30/2023 15.0  % Final   Eosinophils Relative 09/30/2023 11.8  % Final   Basophils Relative 09/30/2023 1.6  % Final   Glucose, Bld 09/30/2023 90  65 - 99 mg/dL Final   Comment: .            Fasting reference interval .    BUN 09/30/2023 18  7 - 25 mg/dL Final   Creat 90/73/7974 0.94  0.70 - 1.28 mg/dL Final   eGFR 90/73/7974 83  > OR = 60 mL/min/1.58m2 Final   BUN/Creatinine Ratio 09/30/2023 SEE NOTE:  6 - 22 (calc) Final   Comment:    Not Reported: BUN and Creatinine are within    reference range. .    Sodium 09/30/2023 139  135 - 146 mmol/L Final   Potassium 09/30/2023 4.5  3.5 - 5.3 mmol/L Final   Chloride 09/30/2023 105  98 - 110 mmol/L Final   CO2 09/30/2023 30  20 - 32 mmol/L  Final   Calcium 09/30/2023 8.5 (L)  8.6 - 10.3 mg/dL Final   Total Protein 90/73/7974 5.5 (L)  6.1 - 8.1 g/dL Final   Albumin 90/73/7974 3.0 (L)  3.6 - 5.1 g/dL Final   Globulin 90/73/7974 2.5  1.9 - 3.7 g/dL (calc) Final   AG Ratio 09/30/2023 1.2  1.0 - 2.5 (calc) Final   Total Bilirubin 09/30/2023 0.7  0.2 - 1.2 mg/dL Final   Alkaline phosphatase (APISO) 09/30/2023 59  35 - 144 U/L Final   AST 09/30/2023 16  10 - 35 U/L Final   ALT 09/30/2023 5 (L)  9 - 46 U/L Final   PSA 09/30/2023 0.36  < OR = 4.00 ng/mL Final   Comment: The total PSA value from this assay system is  standardized against the WHO standard. The test  result will be approximately 20% lower when compared  to the equimolar-standardized total PSA (Beckman  Coulter).  Comparison of serial PSA results should be  interpreted with this fact in mind. . This test was performed using the Siemens  chemiluminescent method. Values obtained from  different assay methods cannot be used interchangeably. PSA levels, regardless of value, should not be interpreted as absolute evidence of the presence or absence of disease.    Cholesterol 09/30/2023 110  <200 mg/dL Final   HDL 90/73/7974 37 (L)  > OR = 40 mg/dL Final   Triglycerides 90/73/7974 52  <150 mg/dL Final   LDL Cholesterol (Calc) 09/30/2023 60  mg/dL (calc) Final   Comment: Reference range: <100 . Desirable range <100 mg/dL for primary prevention;   <70 mg/dL for patients with CHD or diabetic patients  with > or = 2 CHD risk factors. SABRA LDL-C is now calculated using the Martin-Hopkins  calculation, which is a validated novel method providing  better accuracy than the Friedewald equation in the  estimation of LDL-C.  Gladis APPLETHWAITE et al. SANDREA. 7986;689(80): 2061-2068  (http://education.[facility].com/faq/FAQ164)    Total CHOL/HDL Ratio 09/30/2023 3.0  <4.9 (calc) Final   Non-HDL Cholesterol (Calc) 09/30/2023 73  <130 mg/dL (calc) Final   Comment: For patients with diabetes plus 1 major ASCVD risk  factor, treating to a non-HDL-C goal of <100 mg/dL  (LDL-C of <29 mg/dL) is considered a therapeutic  option.    Hgb A1c MFr Bld 09/30/2023 5.1  <5.7 % Final   Comment: For the purpose of screening for the presence of diabetes: . <5.7%       Consistent with the absence of diabetes 5.7-6.4%    Consistent with increased risk for diabetes             (prediabetes) > or =6.5%  Consistent with diabetes . This assay result is consistent with a decreased risk of diabetes. . Currently, no consensus exists regarding use of hemoglobin A1c for diagnosis of diabetes in children. . According to American Diabetes Association (ADA) guidelines, hemoglobin A1c <7.0% represents optimal control in non-pregnant  diabetic patients. Different metrics may apply to specific patient populations.  Standards of Medical Care in Diabetes(ADA). .    Mean Plasma Glucose 09/30/2023 100  mg/dL Final   eAG (mmol/L) 90/73/7974 5.5  mmol/L Final    Past Medical History:  Diagnosis Date   Arthritis    Back pain    Cataract    Cellulitis 07/31/2011   Complication of anesthesia    Pt wakes up too quickly   Enlarged prostate    Foot abscess, left 08/07/2011   Hypertension    prior  to gastric bypass- no meds taken   Lumbar spinal stenosis    Obesity 08/09/2011   Past Surgical History:  Procedure Laterality Date   APPENDECTOMY  1963   CHOLECYSTECTOMY  1984   GASTRIC FUNDOPLICATION  1982   at [facility]   I & D EXTREMITY  08/07/2011   Procedure: IRRIGATION AND DEBRIDEMENT EXTREMITY;  Surgeon: Norleen Armor, MD;  Location: [facility];  Service: Orthopedics;  Laterality: Left;  irrigation and debridement left foot abscess   JOINT REPLACEMENT  02-03-23   KNEE ARTHROSCOPY Right    TOTAL KNEE ARTHROPLASTY Left 02/01/2023   Procedure: LEFT TOTAL KNEE ARTHROPLASTY;  Surgeon: [name], MD;  Location: [facility] OR;  Service: Orthopedics;  Laterality: Left;   Current Outpatient Medications on File Prior to Visit  Medication Sig Dispense Refill   oxybutynin  (DITROPAN -XL) 10 MG 24 hr tablet TAKE 1 TABLET (10 MG TOTAL) BY MOUTH IN THE MORNING AND AT BEDTIME 180 tablet 0   Pediatric Multivit-Minerals (FLINTSTONES COMPLETE) CHEW Chew 1 tablet by mouth daily.     tamsulosin  (FLOMAX ) 0.4 MG CAPS capsule TAKE 1 CAPSULE BY MOUTH EVERYDAY AT BEDTIME 90 capsule 0   No current facility-administered medications on file prior to visit.   No Known Allergies Social History   Socioeconomic History   Marital status: Married    Spouse name: Heron   Number of children: 3   Years of education: Not on file   Highest education level: Associate degree: occupational, Scientist, product/process development, or vocational program  Occupational  History   Not on file  Tobacco Use   Smoking status: Never   Smokeless tobacco: Never  Vaping Use   Vaping status: Never Used  Substance and Sexual Activity   Alcohol use: Never   Drug use: Never   Sexual activity: Not Currently  Other Topics Concern   Not on file  Social History Narrative   3 daughters.   Still working.    Social Drivers of Corporate investment banker Strain: Low Risk  (07/16/2023)   Overall Financial Resource Strain (CARDIA)    Difficulty of Paying Living Expenses: Not hard at all  Food Insecurity: No Food Insecurity (07/16/2023)   Hunger Vital Sign    Worried About Running Out of Food in the Last Year: Never true    Ran Out of Food in the Last Year: Never true  Transportation Needs: No Transportation Needs (07/16/2023)   PRAPARE - Administrator, Civil Service (Medical): No    Lack of Transportation (Non-Medical): No  Physical Activity: Inactive (07/16/2023)   Exercise Vital Sign    Days of Exercise per Week: 0 days    Minutes of Exercise per Session: 0 min  Stress: No Stress Concern Present (07/16/2023)   [employer] of Occupational Health - Occupational Stress Questionnaire    Feeling of Stress: Not at all  Social Connections: Socially Integrated (07/16/2023)   Social Connection and Isolation Panel    Frequency of Communication with Friends and Family: More than three times a week    Frequency of Social Gatherings with Friends and Family: More than three times a week    Attends Religious Services: More than 4 times per year    Active Member of [employer] or Organizations: Yes    Attends Banker Meetings: More than 4 times per year    Marital Status: Married  Catering manager Violence: Not At Risk (07/20/2023)   Humiliation, Afraid, Rape, and Kick questionnaire  Fear of Current or Ex-Partner: No    Emotionally Abused: No    Physically Abused: No    Sexually Abused: No      Review of Systems  All other systems reviewed  and are negative.      Objective:   Physical Exam Vitals reviewed.  Constitutional:      Appearance: He is obese.  Cardiovascular:     Rate and Rhythm: Normal rate and regular rhythm.     Heart sounds: Normal heart sounds.  Pulmonary:     Effort: Pulmonary effort is normal. No respiratory distress.     Breath sounds: Normal breath sounds. No wheezing.  Abdominal:     General: Bowel sounds are normal. There is no distension.     Tenderness: There is no abdominal tenderness. There is no guarding.  Musculoskeletal:     Right lower leg: Edema present.     Left lower leg: Edema present.  Skin:    Findings: No erythema.  Neurological:     Mental Status: He is alert.           Assessment & Plan:  Colon cancer screening - Plan: Cologuard  General medical exam  Morbid obesity with BMI of 45.0-49.9, adult (HCC) I am very proud of the patient for his weight loss.  I encouraged him to keep up the good work.  Lab work is outstanding.  He received a flu shot today.  Recommend COVID-vaccine.  Schedule patient for Cologuard.  PSA is outstanding.

## 2023-10-04 NOTE — Addendum Note (Signed)
 Addended by: ANGELENA RONAL BRADLEY K on: 10/04/2023 10:49 AM   Modules accepted: Orders

## 2023-10-05 ENCOUNTER — Other Ambulatory Visit (INDEPENDENT_AMBULATORY_CARE_PROVIDER_SITE_OTHER): Payer: Self-pay

## 2023-10-05 ENCOUNTER — Ambulatory Visit: Admitting: Orthopaedic Surgery

## 2023-10-05 ENCOUNTER — Encounter: Payer: Self-pay | Admitting: Orthopaedic Surgery

## 2023-10-05 DIAGNOSIS — Z96652 Presence of left artificial knee joint: Secondary | ICD-10-CM | POA: Diagnosis not present

## 2023-10-05 DIAGNOSIS — M25561 Pain in right knee: Secondary | ICD-10-CM | POA: Diagnosis not present

## 2023-10-05 DIAGNOSIS — G8929 Other chronic pain: Secondary | ICD-10-CM | POA: Diagnosis not present

## 2023-10-05 DIAGNOSIS — M1711 Unilateral primary osteoarthritis, right knee: Secondary | ICD-10-CM | POA: Insufficient documentation

## 2023-10-05 NOTE — Progress Notes (Signed)
 The patient is now just past 8 months status post a left total knee replacement to treat significant left knee pain and arthritis.  He has known severe arthritis of his right knee and would like to consider a right knee replacement in January.  He is 78 years old but still works and has some projects he is doing in December.  He says at this point his right knee pain is daily and it is detrimentally affecting his mobility, his quality life and his actives daily living.  We have x-rays showing severe end-stage arthritis with bone-on-bone wear of the right knee in all 3 compartments as well as osteophytes and varus malalignment.  He is very pleased on his range of motion and how the left knee is doing.  Of note he has had all forms of conservative treatment for the right knee including injections.  His BMI is 38.22.  He is not a diabetic.  He is walking without an assistive device.  His left knee is nice and straight.  There is no effusion.  He has full range of motion of the left knee and it is ligamentously stable.  The right knee has varus malalignment with patellofemoral crepitation and significant medial joint line tenderness but good range of motion.  X-rays today of the left knee show well-seated left total knee arthroplasty with no complicating features.  Previous x-rays of the right knee show complete loss of joint space with bone-on-bone wear of all 3 compartments as well as osteophytes and varus malalignment.  I offered him a steroid injection in his right knee today but he has deferred this since they have not really helped in the past given the severity of his arthritis.  He would like to be scheduled for knee replacement surgery sometime after the first of the year.  We will be in touch.  All questions and concerns were addressed and answered.

## 2023-10-13 ENCOUNTER — Telehealth: Payer: Self-pay

## 2023-10-13 NOTE — Telephone Encounter (Signed)
 I called patient to discuss scheduling right TKA.  Left voice mail message for return call.

## 2023-10-30 ENCOUNTER — Other Ambulatory Visit: Payer: Self-pay | Admitting: Family Medicine

## 2023-11-07 ENCOUNTER — Encounter: Payer: Self-pay | Admitting: Radiology

## 2023-12-17 ENCOUNTER — Other Ambulatory Visit: Payer: Self-pay | Admitting: Family Medicine

## 2023-12-17 DIAGNOSIS — N3941 Urge incontinence: Secondary | ICD-10-CM

## 2023-12-30 NOTE — Progress Notes (Signed)
 Sent message, via epic in basket, requesting orders in epic from Careers adviser.

## 2024-01-02 NOTE — Patient Instructions (Signed)
 SURGICAL WAITING ROOM VISITATION Patients having surgery or a procedure may have no more than 2 support people in the waiting area - these visitors may rotate in the visitor waiting room.   Due to an increase in RSV and influenza rates and associated hospitalizations, children ages 69 and under may not visit patients in [facility] hospitals. If the patient needs to stay at the hospital during part of their recovery, the visitor guidelines for inpatient rooms apply.  PRE-OP VISITATION  Pre-op nurse will coordinate an appropriate time for 1 support person to accompany the patient in pre-op.  This support person may not rotate.  This visitor will be contacted when the time is appropriate for the visitor to come back in the pre-op area.  Please refer to the [facility] website for the visitor guidelines for Inpatients (after your surgery is over and you are in a regular room).  You are not required to quarantine at this time prior to your surgery. However, you must do this: Hand Hygiene often Do NOT share personal items Notify your provider if you are in close contact with someone who has COVID or you develop fever 100.4 or greater, new onset of sneezing, cough, sore throat, shortness of breath or body aches.  If you test positive for Covid or have been in contact with anyone that has tested positive in the last 10 days please notify you surgeon.    Your procedure is scheduled on:  01/13/2024  Report to [facility] Main Entrance: [place] entrance where the [employer] is available.   Report to admitting at:8:45 AM  Call this number if you have any questions or problems the morning of surgery [phone]  FOLLOW ANY ADDITIONAL PRE OP INSTRUCTIONS YOU RECEIVED FROM YOUR SURGEON'S OFFICE!!!  Do not eat food after Midnight the night prior to your surgery/procedure.  After Midnight you may have the following liquids until: 8:15 AM DAY OF SURGERY  Clear Liquid Diet Water Black  Coffee (sugar ok, NO MILK/CREAM OR CREAMERS)  Tea (sugar ok, NO MILK/CREAM OR CREAMERS) regular and decaf                             Plain Jell-O  with no fruit (NO RED)                                           Fruit ices (not with fruit pulp, NO RED)                                     Popsicles (NO RED)                                                                  Juice: NO CITRUS JUICES: only apple, WHITE grape, WHITE cranberry Sports drinks like Gatorade or Powerade (NO RED)   The day of surgery:  Drink ONE (1) Pre-Surgery Clear Ensure at : 8:15 AM the morning of surgery. Drink in one sitting. Do not sip.  This drink was given to  you during your hospital pre-op appointment visit. Nothing else to drink after completing the Pre-Surgery Clear Ensure or G2 : No candy, chewing gum or throat lozenges.    Oral Hygiene is also important to reduce your risk of infection.        Remember - BRUSH YOUR TEETH THE MORNING OF SURGERY WITH YOUR REGULAR TOOTHPASTE  Do NOT smoke after Midnight the night before surgery.  STOP TAKING all Vitamins, Herbs and supplements 1 week before your surgery.   Take ONLY these medicines the morning of surgery with A SIP OF WATER: oxybutynin (ditropan ).                   You may not have any metal on your body including hair pins, jewelry, and body piercing  Do not wear lotions, powders, perfumes / cologne, or deodorant  Men may shave face and neck.  Contacts, Hearing Aids, dentures or bridgework may not be worn into surgery. DENTURES WILL BE REMOVED PRIOR TO SURGERY PLEASE DO NOT APPLY Poly grip OR ADHESIVES!!!  You may bring a small overnight bag with you on the day of surgery, only pack items that are not valuable. [facility] IS NOT RESPONSIBLE   FOR VALUABLES THAT ARE LOST OR STOLEN.   Patients discharged on the day of surgery will not be allowed to drive home.  Someone NEEDS to stay with you for the first 24 hours after anesthesia.  Do not bring your  home medications to the hospital. The Pharmacy will dispense medications listed on your medication list to you during your admission in the Hospital.  Special Instructions: Bring a copy of your healthcare power of attorney and living will documents the day of surgery, if you wish to have them scanned into your [facility]- EPIC  Please read over the following fact sheets you were given: IF YOU HAVE QUESTIONS ABOUT YOUR PRE-OP INSTRUCTIONS, PLEASE CALL [phone]   PATIENT SIGNATURE_________________________________  NURSE SIGNATURE__________________________________  ________________________________________________________________________  Pre-operative 4 CHG Bath Instructions  DYNA-Hex 4 Chlorhexidine  Gluconate 4% Solution Antiseptic 4 fl. oz   You can play a key role in reducing the risk of infection after surgery. Your skin needs to be as free of germs as possible. You can reduce the number of germs on your skin by washing with CHG (chlorhexidine  gluconate) soap before surgery. CHG is an antiseptic soap that kills germs and continues to kill germs even after washing.   DO NOT use if you have an allergy to chlorhexidine /CHG or antibacterial soaps. If your skin becomes reddened or irritated, stop using the CHG and notify one of our RNs at   Please shower with the CHG soap starting 4 days before surgery using the following schedule:     Please keep in mind the following:  DO NOT shave, including legs and underarms, starting the day of your first shower.   You may shave your face at any point before/day of surgery.  Place clean sheets on your bed the day you start using CHG soap. Use a clean washcloth (not used since being washed) for each shower. DO NOT sleep with pets once you start using the CHG.  CHG Shower Instructions:  If you choose to wash your hair and private area, wash first with your normal shampoo/soap.  After you use shampoo/soap, rinse your hair and body  thoroughly to remove shampoo/soap residue.  Turn the water OFF and apply about 3 tablespoons (45 ml) of CHG soap to a CLEAN washcloth.  Apply CHG soap ONLY FROM YOUR NECK DOWN TO YOUR TOES (washing for 3-5 minutes)  DO NOT use CHG soap on face, private areas, open wounds, or sores.  Pay special attention to the area where your surgery is being performed.  If you are having back surgery, having someone wash your back for you may be helpful. Wait 2 minutes after CHG soap is applied, then you may rinse off the CHG soap.  Pat dry with a clean towel  Put on clean clothes/pajamas   If you choose to wear lotion, please use ONLY the CHG-compatible lotions on the back of this paper.     Additional instructions for the day of surgery: DO NOT APPLY any lotions, deodorants, cologne, or perfumes.   Put on clean/comfortable clothes.  Brush your teeth.  Ask your nurse before applying any prescription medications to the skin.   CHG Compatible Lotions   Aveeno Moisturizing lotion  Cetaphil Moisturizing Cream  Cetaphil Moisturizing Lotion  Clairol Herbal Essence Moisturizing Lotion, Dry Skin  Clairol Herbal Essence Moisturizing Lotion, Extra Dry Skin  Clairol Herbal Essence Moisturizing Lotion, Normal Skin  Curel Age Defying Therapeutic Moisturizing Lotion with Alpha Hydroxy  Curel Extreme Care Body Lotion  Curel Soothing Hands Moisturizing Hand Lotion  Curel Therapeutic Moisturizing Cream, Fragrance-Free  Curel Therapeutic Moisturizing Lotion, Fragrance-Free  Curel Therapeutic Moisturizing Lotion, Original Formula  Eucerin Daily Replenishing Lotion  Eucerin Dry Skin Therapy Plus Alpha Hydroxy Crme  Eucerin Dry Skin Therapy Plus Alpha Hydroxy Lotion  Eucerin Original Crme  Eucerin Original Lotion  Eucerin Plus Crme Eucerin Plus Lotion  Eucerin TriLipid Replenishing Lotion  Keri Anti-Bacterial Hand Lotion  Keri Deep Conditioning Original Lotion Dry Skin Formula Softly Scented  Keri Deep  Conditioning Original Lotion, Fragrance Free Sensitive Skin Formula  Keri Lotion Fast Absorbing Fragrance Free Sensitive Skin Formula  Keri Lotion Fast Absorbing Softly Scented Dry Skin Formula  Keri Original Lotion  Keri Skin Renewal Lotion Keri Silky Smooth Lotion  Keri Silky Smooth Sensitive Skin Lotion  Nivea Body Creamy Conditioning Oil  Nivea Body Extra Enriched Lotion  Nivea Body Original Lotion  Nivea Body Sheer Moisturizing Lotion Nivea Crme  Nivea Skin Firming Lotion  NutraDerm 30 Skin Lotion  NutraDerm Skin Lotion  NutraDerm Therapeutic Skin Cream  NutraDerm Therapeutic Skin Lotion  ProShield Protective Hand Cream  Provon moisturizing lotion  Incentive Spirometer  An incentive spirometer is a tool that can help keep your lungs clear and active. This tool measures how well you are filling your lungs with each breath. Taking long deep breaths may help reverse or decrease the chance of developing breathing (pulmonary) problems (especially infection) following: A long period of time when you are unable to move or be active. BEFORE THE PROCEDURE  If the spirometer includes an indicator to show your best effort, your nurse or respiratory therapist will set it to a desired goal. If possible, sit up straight or lean slightly forward. Try not to slouch. Hold the incentive spirometer in an upright position. INSTRUCTIONS FOR USE  Sit on the edge of your bed if possible, or sit up as far as you can in bed or on a chair. Hold the incentive spirometer in an upright position. Breathe out normally. Place the mouthpiece in your mouth and seal your lips tightly around it. Breathe in slowly and as deeply as possible, raising the piston or the ball toward the top of the column. Hold your breath for 3-5 seconds or for as long as possible. Allow  the piston or ball to fall to the bottom of the column. Remove the mouthpiece from your mouth and breathe out normally. Rest for a few seconds and  repeat Steps 1 through 7 at least 10 times every 1-2 hours when you are awake. Take your time and take a few normal breaths between deep breaths. The spirometer may include an indicator to show your best effort. Use the indicator as a goal to work toward during each repetition. After each set of 10 deep breaths, practice coughing to be sure your lungs are clear. If you have an incision (the cut made at the time of surgery), support your incision when coughing by placing a pillow or rolled up towels firmly against it. Once you are able to get out of bed, walk around indoors and cough well. You may stop using the incentive spirometer when instructed by your caregiver.  RISKS AND COMPLICATIONS Take your time so you do not get dizzy or light-headed. If you are in pain, you may need to take or ask for pain medication before doing incentive spirometry. It is harder to take a deep breath if you are having pain. AFTER USE Rest and breathe slowly and easily. It can be helpful to keep track of a log of your progress. Your caregiver can provide you with a simple table to help with this. If you are using the spirometer at home, follow these instructions: SEEK MEDICAL CARE IF:  You are having difficultly using the spirometer. You have trouble using the spirometer as often as instructed. Your pain medication is not giving enough relief while using the spirometer. You develop fever of 100.5 F (38.1 C) or higher. SEEK IMMEDIATE MEDICAL CARE IF:  You cough up bloody sputum that had not been present before. You develop fever of 102 F (38.9 C) or greater. You develop worsening pain at or near the incision site. MAKE SURE YOU:  Understand these instructions. Will watch your condition. Will get help right away if you are not doing well or get worse. Document Released: 05/03/2006 Document Revised: 03/15/2011 Document Reviewed: 07/04/2006 [facility] Patient Information 2014 [place],  [place].   ________________________________________________________________________

## 2024-01-03 ENCOUNTER — Other Ambulatory Visit: Payer: Self-pay | Admitting: Physician Assistant

## 2024-01-03 DIAGNOSIS — Z01818 Encounter for other preprocedural examination: Secondary | ICD-10-CM

## 2024-01-09 ENCOUNTER — Other Ambulatory Visit: Payer: Self-pay

## 2024-01-09 ENCOUNTER — Encounter (HOSPITAL_COMMUNITY)
Admission: RE | Admit: 2024-01-09 | Discharge: 2024-01-09 | Disposition: A | Discharge status: Home / Self Care | Source: Ambulatory Visit | Attending: Orthopaedic Surgery | Admitting: Orthopaedic Surgery

## 2024-01-09 ENCOUNTER — Encounter (HOSPITAL_COMMUNITY): Payer: Self-pay

## 2024-01-09 VITALS — BP 141/85 | HR 80 | Temp 97.7°F | Ht 71.0 in | Wt 272.0 lb

## 2024-01-09 DIAGNOSIS — M1711 Unilateral primary osteoarthritis, right knee: Secondary | ICD-10-CM | POA: Insufficient documentation

## 2024-01-09 DIAGNOSIS — I10 Essential (primary) hypertension: Secondary | ICD-10-CM | POA: Insufficient documentation

## 2024-01-09 DIAGNOSIS — Z96652 Presence of left artificial knee joint: Secondary | ICD-10-CM | POA: Insufficient documentation

## 2024-01-09 DIAGNOSIS — N3941 Urge incontinence: Secondary | ICD-10-CM

## 2024-01-09 DIAGNOSIS — Z9884 Bariatric surgery status: Secondary | ICD-10-CM | POA: Diagnosis not present

## 2024-01-09 DIAGNOSIS — Z01818 Encounter for other preprocedural examination: Secondary | ICD-10-CM

## 2024-01-09 DIAGNOSIS — Z01812 Encounter for preprocedural laboratory examination: Secondary | ICD-10-CM | POA: Diagnosis present

## 2024-01-09 LAB — CBC
HCT: 44.6 % (ref 39.0–52.0)
Hemoglobin: 14.9 g/dL (ref 13.0–17.0)
MCH: 31 pg (ref 26.0–34.0)
MCHC: 33.4 g/dL (ref 30.0–36.0)
MCV: 92.7 fL (ref 80.0–100.0)
Platelets: 196 K/uL (ref 150–400)
RBC: 4.81 MIL/uL (ref 4.22–5.81)
RDW: 13.4 % (ref 11.5–15.5)
WBC: 5.3 K/uL (ref 4.0–10.5)
nRBC: 0 % (ref 0.0–0.2)

## 2024-01-09 LAB — BASIC METABOLIC PANEL WITH GFR
Anion gap: 6 (ref 5–15)
BUN: 18 mg/dL (ref 8–23)
CO2: 28 mmol/L (ref 22–32)
Calcium: 8.9 mg/dL (ref 8.9–10.3)
Chloride: 105 mmol/L (ref 98–111)
Creatinine, Ser: 0.89 mg/dL (ref 0.61–1.24)
GFR, Estimated: >60 mL/min
Glucose, Bld: 99 mg/dL (ref 70–99)
Potassium: 4.5 mmol/L (ref 3.5–5.1)
Sodium: 139 mmol/L (ref 135–145)

## 2024-01-09 LAB — SURGICAL PCR SCREEN
MRSA, PCR: NEGATIVE
Staphylococcus aureus: NEGATIVE

## 2024-01-09 NOTE — Progress Notes (Signed)
" °   01/09/24 1330  OBSTRUCTIVE SLEEP APNEA  Have you ever been diagnosed with sleep apnea through a sleep study? No  Do you snore loudly (loud enough to be heard through closed doors)?  1  Do you often feel tired, fatigued, or sleepy during the daytime (such as falling asleep during driving or talking to someone)? 0  Has anyone observed you stop breathing during your sleep? 1  Do you have, or are you being treated for high blood pressure? 0  BMI more than 35 kg/m2? 1  Age > 50 (1-yes) 1  Neck circumference greater than:Male 16 inches or larger, Male 17inches or larger? 0  Male Gender (Yes=1) 1  Obstructive Sleep Apnea Score 5    "

## 2024-01-09 NOTE — Progress Notes (Deleted)
" °   01/09/24 1331  OBSTRUCTIVE SLEEP APNEA  Score 5 or greater  Results sent to PCP    "

## 2024-01-09 NOTE — Progress Notes (Signed)
 For Anesthesia: PCP - Duanne Butler DASEN, MD  Cardiologist - N/A  Bowel Prep reminder:  Chest x-ray - 06/23/23 EKG - 06/23/23 Stress Test -  ECHO -  Cardiac Cath -  Pacemaker/ICD device last checked: Pacemaker orders received: Device Rep notified:  Spinal Cord Stimulator:N/A  Sleep Study - N/A CPAP -   Fasting Blood Sugar - N/A Checks Blood Sugar _____ times a day Date and result of last Hgb A1c-  Last dose of GLP1 agonist- N/A GLP1 instructions: Hold 7 days prior to schedule (Hold 24 hours-daily)   Last dose of SGLT-2 inhibitors- N/A SGLT-2 instructions: Hold 72 hours prior to surgery  Blood Thinner Instructions:N/A Last Dose: Time last taken:  Aspirin  Instructions:N/A Last Dose: Time last taken:  Activity level: Can go up a flight of stairs and activities of daily living without stopping and without chest pain and/or shortness of breath   Able to exercise without chest pain and/or shortness of breath  Anesthesia review:   Patient denies shortness of breath, fever, cough and chest pain at PAT appointment   Patient verbalized understanding of instructions that were reviewed over the telephone.

## 2024-01-10 DIAGNOSIS — M1711 Unilateral primary osteoarthritis, right knee: Secondary | ICD-10-CM

## 2024-01-10 NOTE — Care Plan (Signed)
 Ortho Bundle Case Management Note  Patient Details  Name: Keith Watkins MRN: [id number] Date of Birth: 01-Dec-1945  [facility] RNCM call to patient to discuss his upcoming Right total knee arthroplasty with Dr. Vernetta on 01/13/24 at [facility]. He had his left total knee done about a year ago with us  as well. He is agreeable to case management. He will be discharging to his daughter's home after discharge and his family will be assisting. He has all needed DME- RW, rollator, cane. Anticipate HHPT will be needed after short hospital stay. Referral made to [facility] after choice provided. Reviewed post op care instructions and copy emailed to patient for his records. Will continue to follow for needs.                  DME Arranged:   (Patient has RW, cane, rollator at his home already; no DME needed) DME Agency:     HH Arranged:  PT HH Agency:  [facility]  Additional Comments: Please contact me with any questions of if this plan should need to change.  Tylene Ned, RN, BSN, [employer]  [phone] 01/10/2024, 1:33 PM

## 2024-01-10 NOTE — Anesthesia Preprocedure Evaluation (Addendum)
"                                    Anesthesia Evaluation  Patient identified by MRN, date of birth, ID band Patient awake    Reviewed: Allergy & Precautions, NPO status , Patient's Chart, lab work & pertinent test results  History of Anesthesia Complications Negative for: history of anesthetic complications  Airway Mallampati: II  TM Distance: >3 FB Neck ROM: Full    Dental  (+) Missing, Poor Dentition, Chipped   Pulmonary neg pulmonary ROS   Pulmonary exam normal        Cardiovascular hypertension, Normal cardiovascular exam     Neuro/Psych negative neurological ROS     GI/Hepatic negative GI ROS, Neg liver ROS,,,  Endo/Other  negative endocrine ROS    Renal/GU negative Renal ROS   BPH    Musculoskeletal  (+) Arthritis ,    Abdominal   Peds  Hematology negative hematology ROS (+)   Anesthesia Other Findings   Reproductive/Obstetrics                              Anesthesia Physical Anesthesia Plan  ASA: 2  Anesthesia Plan: Spinal   Post-op Pain Management: Tylenol  PO (pre-op)* and Regional block*   Induction:   PONV Risk Score and Plan: 2 and Treatment may vary due to age or medical condition, Ondansetron , Propofol  infusion and Dexamethasone   Airway Management Planned: Natural Airway and Simple Face Mask  Additional Equipment: None  Intra-op Plan:   Post-operative Plan:   Informed Consent: I have reviewed the patients History and Physical, chart, labs and discussed the procedure including the risks, benefits and alternatives for the proposed anesthesia with the patient or authorized representative who has indicated his/her understanding and acceptance.       Plan Discussed with: CRNA  Anesthesia Plan Comments: (See PAT note from 1/5)         Anesthesia Quick Evaluation  "

## 2024-01-10 NOTE — Progress Notes (Signed)
 " Case: [id number] Date/Time: 01/13/24 0815   Procedure: ARTHROPLASTY, KNEE, TOTAL (Right: Knee)   Anesthesia type: Spinal   Diagnosis: Primary osteoarthritis of right knee [M17.11]   Pre-op diagnosis: osteoarthritis right knee   Location: [facility] ROOM 10 / [facility]   Surgeons: [name], MD       DISCUSSION: Keith Watkins is a 79 yo male with PMH of HTN, STOP-BANG score 5, BPH, osteoarthritis, gastric bypass (1982), cholecystectomy (1984), appendectomy (1963), knee arthroscopy. Reported waking up to quickly with a prior procedure.  Patient underwent L TKA on 02/01/23 with Dr. Vernetta under spinal anesthesia. No complications noted.  Seen by PCP on 10/04/23 for annual exam. Per Dr. Duanne: Patient has lost considerable weight.  He has had his left knee replaced.  He is planning to have his right knee replaced.  Otherwise he feels great.  He denies any chest pain shortness of breath or dyspnea on exertion.  Immunizations are up-to-date.   VS: BP (!) 141/85   Pulse 80   Temp 36.5 C (Oral)   Ht 5' 11 (1.803 m)   Wt 123.4 kg   SpO2 97%   BMI 37.94 kg/m   PROVIDERS: Duanne Butler DASEN, MD   LABS: Labs reviewed: Acceptable for surgery. (all labs ordered are listed, but only abnormal results are displayed)  Labs Reviewed  SURGICAL PCR SCREEN  CBC  BASIC METABOLIC PANEL WITH GFR  TYPE AND SCREEN     IMAGES:   EKG 06/23/23:  NSR LAD   Echo 07/29/21: IMPRESSIONS   1. Technically difficult study. Left ventricular ejection fraction, by  estimation, is 50 to 55%. The left ventricle has low normal function. Left  ventricular endocardial border not optimally defined to evaluate regional  wall motion. There is mild left  ventricular hypertrophy. Left ventricular diastolic parameters were  normal.   2. Right ventricular systolic function is normal. The right ventricular  size is normal. Tricuspid regurgitation signal is inadequate for assessing  PA pressure.   3. The  mitral valve is normal in structure. No evidence of mitral valve  regurgitation.   4. The aortic valve was not well visualized. Aortic valve regurgitation  is not visualized. No aortic stenosis is present.   5. Aortic dilatation noted. There is dilatation of the ascending aorta,  measuring 41 mm.   6. The inferior vena cava is dilated in size with <50% respiratory  variability, suggesting right atrial pressure of 15 mmHg.   Past Medical History:  Diagnosis Date   Arthritis    Back pain    Cataract    Cellulitis 07/31/2011   Complication of anesthesia    Pt wakes up too quickly   Enlarged prostate    Foot abscess, left 08/07/2011   Hypertension    prior to gastric bypass- no meds taken   Lumbar spinal stenosis    Obesity 08/09/2011    Past Surgical History:  Procedure Laterality Date   APPENDECTOMY  1963   CHOLECYSTECTOMY  1984   GASTRIC FUNDOPLICATION  1982   at [facility]   I & D EXTREMITY  08/07/2011   Procedure: IRRIGATION AND DEBRIDEMENT EXTREMITY;  Surgeon: Norleen Armor, MD;  Location: [facility];  Service: Orthopedics;  Laterality: Left;  irrigation and debridement left foot abscess   JOINT REPLACEMENT  02-03-23   KNEE ARTHROSCOPY Right    TOTAL KNEE ARTHROPLASTY Left 02/01/2023   Procedure: LEFT TOTAL KNEE ARTHROPLASTY;  Surgeon: [name], MD;  Location: [facility];  Service: Orthopedics;  Laterality: Left;    MEDICATIONS:  oxybutynin  (DITROPAN -XL) 10 MG 24 hr tablet   tamsulosin  (FLOMAX ) 0.4 MG CAPS capsule   No current facility-administered medications for this encounter.   Burnard CHRISTELLA Odis DEVONNA [facility] Surgical Short Stay/Anesthesiology [facility] Phone [phone] 01/10/2024 10:21 AM        "

## 2024-01-12 NOTE — H&P (Signed)
 TOTAL KNEE ADMISSION H&P  Patient is being admitted for right total knee arthroplasty.  Subjective:  Chief Complaint:right knee pain.  HPI: ASHDON Watkins, 79 y.o. male, has a history of pain and functional disability in the right knee due to arthritis and has failed non-surgical conservative treatments for greater than 12 weeks to includeNSAID's and/or analgesics, corticosteriod injections, flexibility and strengthening excercises, use of assistive devices, weight reduction as appropriate, and activity modification.  Onset of symptoms was gradual, starting several years ago with gradually worsening course since that time. The patient noted prior procedures on the knee to include  arthroscopy on the right knee(s).  Patient currently rates pain in the right knee(s) at 10 out of 10 with activity. Patient has night pain, worsening of pain with activity and weight bearing, pain that interferes with activities of daily living, pain with passive range of motion, crepitus, and joint swelling.  Patient has evidence of subchondral cysts, subchondral sclerosis, periarticular osteophytes, and joint space narrowing by imaging studies. There is no active infection.  Patient Active Problem List   Diagnosis Date Noted   Unilateral primary osteoarthritis, right knee 10/05/2023   Status post total left knee replacement 02/01/2023   Bilateral lower extremity edema 06/21/2018   Cellulitis of left lower leg 08/21/2016   Eosinophilia 03/18/2014   Lumbar spinal stenosis    Benign prostatic hyperplasia (BPH) with urinary urge incontinence 08/09/2011   Past Medical History:  Diagnosis Date   Arthritis    Back pain    Cataract    Cellulitis 07/31/2011   Complication of anesthesia    Pt wakes up too quickly   Enlarged prostate    Foot abscess, left 08/07/2011   Hypertension    prior to gastric bypass- no meds taken   Lumbar spinal stenosis    Obesity 08/09/2011    Past Surgical History:  Procedure Laterality  Date   APPENDECTOMY  1963   CHOLECYSTECTOMY  1984   GASTRIC FUNDOPLICATION  1982   at [facility]   I & D EXTREMITY  08/07/2011   Procedure: IRRIGATION AND DEBRIDEMENT EXTREMITY;  Surgeon: Norleen Armor, MD;  Location: [facility];  Service: Orthopedics;  Laterality: Left;  irrigation and debridement left foot abscess   JOINT REPLACEMENT  02-03-23   KNEE ARTHROSCOPY Right    TOTAL KNEE ARTHROPLASTY Left 02/01/2023   Procedure: LEFT TOTAL KNEE ARTHROPLASTY;  Surgeon: [name], MD;  Location: [facility] OR;  Service: Orthopedics;  Laterality: Left;    No current facility-administered medications for this encounter.   Current Outpatient Medications  Medication Sig Dispense Refill Last Dose/Taking   oxybutynin  (DITROPAN -XL) 10 MG 24 hr tablet TAKE 1 TABLET (10 MG TOTAL) BY MOUTH IN THE MORNING AND AT BEDTIME 180 tablet 0 Taking   tamsulosin  (FLOMAX ) 0.4 MG CAPS capsule TAKE 1 CAPSULE BY MOUTH EVERYDAY AT BEDTIME 90 capsule 0 Taking   Allergies[1]  Social History   Tobacco Use   Smoking status: Never   Smokeless tobacco: Never  Substance Use Topics   Alcohol use: Never    Family History  Problem Relation Age of Onset   Heart disease Mother    Cancer Sister        Breast   Breast cancer Sister    Hypertension Brother    Hypertension Maternal Grandmother    Stroke Maternal Grandmother    Arthritis Maternal Grandmother    Heart disease Maternal Grandfather    Hypertension Maternal Grandfather    Diabetes Sister  Heart disease Sister    Hyperlipidemia Sister    Hypertension Sister    Stroke Sister    Hypertension Paternal Grandmother    Stroke Paternal Grandmother    Diabetes Other    Stroke Other    Hypertension Other    Coronary artery disease Other    Heart failure Other    Colon cancer Neg Hx    Esophageal cancer Neg Hx    Stomach cancer Neg Hx    Rectal cancer Neg Hx      Review of Systems  Objective:  Physical Exam Constitutional:      Appearance:  Normal appearance. He is obese.  HENT:     Head: Normocephalic and atraumatic.  Eyes:     Extraocular Movements: Extraocular movements intact.     Pupils: Pupils are equal, round, and reactive to light.  Cardiovascular:     Rate and Rhythm: Normal rate and regular rhythm.  Pulmonary:     Effort: Pulmonary effort is normal.     Breath sounds: Normal breath sounds.  Abdominal:     Palpations: Abdomen is soft.  Musculoskeletal:     Cervical back: Normal range of motion and neck supple.     Right knee: Effusion, bony tenderness and crepitus present. Decreased range of motion. Tenderness present over the medial joint line and lateral joint line. Abnormal alignment and abnormal meniscus.  Neurological:     Mental Status: He is alert and oriented to person, place, and time.  Psychiatric:        Behavior: Behavior normal.     Vital signs in last 24 hours:    Labs:   Estimated body mass index is 37.94 kg/m as calculated from the following:   Height as of 01/09/24: 5' 11 (1.803 m).   Weight as of 01/09/24: 123.4 kg.   Imaging Review Plain radiographs demonstrate severe degenerative joint disease of the right knee(s). The overall alignment ismild varus. The bone quality appears to be good for age and reported activity level.      Assessment/Plan:  End stage arthritis, right knee   The patient history, physical examination, clinical judgment of the provider and imaging studies are consistent with end stage degenerative joint disease of the right knee(s) and total knee arthroplasty is deemed medically necessary. The treatment options including medical management, injection therapy arthroscopy and arthroplasty were discussed at length. The risks and benefits of total knee arthroplasty were presented and reviewed. The risks due to aseptic loosening, infection, stiffness, patella tracking problems, thromboembolic complications and other imponderables were discussed. The patient  acknowledged the explanation, agreed to proceed with the plan and consent was signed. Patient is being admitted for inpatient treatment for surgery, pain control, PT, OT, prophylactic antibiotics, VTE prophylaxis, progressive ambulation and ADL's and discharge planning. The patient is planning to be discharged home with home health services        [1] No Known Allergies

## 2024-01-13 ENCOUNTER — Ambulatory Visit (HOSPITAL_COMMUNITY): Payer: Self-pay | Admitting: Medical

## 2024-01-13 ENCOUNTER — Encounter (HOSPITAL_COMMUNITY): Payer: Self-pay | Admitting: Orthopaedic Surgery

## 2024-01-13 ENCOUNTER — Observation Stay (HOSPITAL_COMMUNITY)
Admission: RE | Admit: 2024-01-13 | Discharge: 2024-01-16 | Disposition: A | Attending: Orthopaedic Surgery | Admitting: Orthopaedic Surgery

## 2024-01-13 ENCOUNTER — Observation Stay (HOSPITAL_COMMUNITY)

## 2024-01-13 ENCOUNTER — Encounter (HOSPITAL_COMMUNITY): Admission: RE | Disposition: A | Payer: Self-pay | Source: Home / Self Care | Attending: Orthopaedic Surgery

## 2024-01-13 ENCOUNTER — Ambulatory Visit (HOSPITAL_COMMUNITY): Admitting: Anesthesiology

## 2024-01-13 ENCOUNTER — Other Ambulatory Visit: Payer: Self-pay

## 2024-01-13 DIAGNOSIS — Z01818 Encounter for other preprocedural examination: Secondary | ICD-10-CM

## 2024-01-13 DIAGNOSIS — I10 Essential (primary) hypertension: Secondary | ICD-10-CM | POA: Insufficient documentation

## 2024-01-13 DIAGNOSIS — M1711 Unilateral primary osteoarthritis, right knee: Secondary | ICD-10-CM | POA: Diagnosis not present

## 2024-01-13 DIAGNOSIS — Z96652 Presence of left artificial knee joint: Secondary | ICD-10-CM | POA: Diagnosis not present

## 2024-01-13 DIAGNOSIS — Z96651 Presence of right artificial knee joint: Secondary | ICD-10-CM

## 2024-01-13 DIAGNOSIS — M25561 Pain in right knee: Secondary | ICD-10-CM | POA: Diagnosis present

## 2024-01-13 HISTORY — PX: TOTAL KNEE ARTHROPLASTY: SHX125

## 2024-01-13 LAB — CBC
HCT: 39 % (ref 39.0–52.0)
Hemoglobin: 12.6 g/dL — ABNORMAL LOW (ref 13.0–17.0)
MCH: 30 pg (ref 26.0–34.0)
MCHC: 32.3 g/dL (ref 30.0–36.0)
MCV: 92.9 fL (ref 80.0–100.0)
Platelets: 184 K/uL (ref 150–400)
RBC: 4.2 MIL/uL — ABNORMAL LOW (ref 4.22–5.81)
RDW: 13.4 % (ref 11.5–15.5)
WBC: 4.6 K/uL (ref 4.0–10.5)
nRBC: 0 % (ref 0.0–0.2)

## 2024-01-13 LAB — BASIC METABOLIC PANEL WITH GFR
Anion gap: 8 (ref 5–15)
BUN: 20 mg/dL (ref 8–23)
CO2: 25 mmol/L (ref 22–32)
Calcium: 8.5 mg/dL — ABNORMAL LOW (ref 8.9–10.3)
Chloride: 107 mmol/L (ref 98–111)
Creatinine, Ser: 0.84 mg/dL (ref 0.61–1.24)
GFR, Estimated: >60 mL/min
Glucose, Bld: 116 mg/dL — ABNORMAL HIGH (ref 70–99)
Potassium: 3.6 mmol/L (ref 3.5–5.1)
Sodium: 141 mmol/L (ref 135–145)

## 2024-01-13 LAB — TYPE AND SCREEN
ABO/RH(D): O POS
Antibody Screen: NEGATIVE

## 2024-01-13 LAB — ABO/RH: ABO/RH(D): O POS

## 2024-01-13 SURGERY — ARTHROPLASTY, KNEE, TOTAL
Anesthesia: Spinal | Site: Knee | Laterality: Right

## 2024-01-13 MED ORDER — 0.9 % SODIUM CHLORIDE (POUR BTL) OPTIME
TOPICAL | Status: DC | PRN
  Administered 2024-01-13: 1000 mL

## 2024-01-13 MED ORDER — DIPHENHYDRAMINE HCL 12.5 MG/5ML PO ELIX: 12.5000 mg | ORAL_SOLUTION | ORAL | Status: DC | PRN

## 2024-01-13 MED ORDER — CEFAZOLIN SODIUM-DEXTROSE 3-4 GM/150ML-% IV SOLN
3.0000 g | INTRAVENOUS | Status: AC
  Administered 2024-01-13: 3 g via INTRAVENOUS
  Filled 2024-01-13: qty 150

## 2024-01-13 MED ORDER — PHENYLEPHRINE 80 MCG/ML (10ML) SYRINGE FOR IV PUSH (FOR BLOOD PRESSURE SUPPORT)
PREFILLED_SYRINGE | INTRAVENOUS | Status: AC
  Filled 2024-01-13: qty 10

## 2024-01-13 MED ORDER — ONDANSETRON HCL 4 MG/2ML IJ SOLN
INTRAMUSCULAR | Status: DC | PRN
  Administered 2024-01-13: 4 mg via INTRAVENOUS

## 2024-01-13 MED ORDER — PROPOFOL 1000 MG/100ML IV EMUL
INTRAVENOUS | Status: AC
  Filled 2024-01-13: qty 100

## 2024-01-13 MED ORDER — ASPIRIN 81 MG PO CHEW
81.0000 mg | CHEWABLE_TABLET | Freq: Two times a day (BID) | ORAL | Status: DC
  Administered 2024-01-13 – 2024-01-16 (×6): 81 mg via ORAL
  Filled 2024-01-13 (×6): qty 1

## 2024-01-13 MED ORDER — HYDROMORPHONE HCL 1 MG/ML IJ SOLN
INTRAMUSCULAR | Status: AC
  Filled 2024-01-13: qty 1

## 2024-01-13 MED ORDER — SODIUM CHLORIDE 0.9 % IV SOLN: INTRAVENOUS | Status: AC

## 2024-01-13 MED ORDER — CLONIDINE HCL (ANALGESIA) 100 MCG/ML EP SOLN
EPIDURAL | Status: DC | PRN
  Administered 2024-01-13: 100 ug

## 2024-01-13 MED ORDER — BUPIVACAINE IN DEXTROSE 0.75-8.25 % IT SOLN
INTRATHECAL | Status: DC | PRN
  Administered 2024-01-13: 1.6 mL via INTRATHECAL

## 2024-01-13 MED ORDER — TRANEXAMIC ACID-NACL 1000-0.7 MG/100ML-% IV SOLN
1000.0000 mg | INTRAVENOUS | Status: AC
  Administered 2024-01-13: 1000 mg via INTRAVENOUS
  Filled 2024-01-13: qty 100

## 2024-01-13 MED ORDER — PANTOPRAZOLE SODIUM 40 MG PO TBEC
40.0000 mg | DELAYED_RELEASE_TABLET | Freq: Every day | ORAL | Status: DC
  Administered 2024-01-13 – 2024-01-16 (×4): 40 mg via ORAL
  Filled 2024-01-13 (×4): qty 1

## 2024-01-13 MED ORDER — CEFAZOLIN SODIUM-DEXTROSE 2-4 GM/100ML-% IV SOLN
2.0000 g | Freq: Four times a day (QID) | INTRAVENOUS | Status: AC
  Administered 2024-01-13 (×2): 2 g via INTRAVENOUS
  Filled 2024-01-13 (×2): qty 100

## 2024-01-13 MED ORDER — ALUM & MAG HYDROXIDE-SIMETH 200-200-20 MG/5ML PO SUSP: 30.0000 mL | ORAL | Status: DC | PRN

## 2024-01-13 MED ORDER — METHOCARBAMOL 500 MG PO TABS
ORAL_TABLET | ORAL | Status: AC
  Filled 2024-01-13: qty 1

## 2024-01-13 MED ORDER — ONDANSETRON HCL 4 MG/2ML IJ SOLN
INTRAMUSCULAR | Status: AC
  Filled 2024-01-13: qty 2

## 2024-01-13 MED ORDER — PROPOFOL 500 MG/50ML IV EMUL
INTRAVENOUS | Status: AC
  Filled 2024-01-13: qty 50

## 2024-01-13 MED ORDER — LACTATED RINGERS IV SOLN: INTRAVENOUS | Status: DC

## 2024-01-13 MED ORDER — PHENYLEPHRINE HCL-NACL 20-0.9 MG/250ML-% IV SOLN
INTRAVENOUS | Status: DC | PRN
  Administered 2024-01-13: 30 ug/min via INTRAVENOUS

## 2024-01-13 MED ORDER — BUPIVACAINE-EPINEPHRINE 0.25% -1:200000 IJ SOLN
INTRAMUSCULAR | Status: DC | PRN
  Administered 2024-01-13: 30 mL

## 2024-01-13 MED ORDER — PHENYLEPHRINE 80 MCG/ML (10ML) SYRINGE FOR IV PUSH (FOR BLOOD PRESSURE SUPPORT)
PREFILLED_SYRINGE | INTRAVENOUS | Status: DC | PRN
  Administered 2024-01-13 (×4): 80 ug via INTRAVENOUS

## 2024-01-13 MED ORDER — MENTHOL 3 MG MT LOZG: 1.0000 | LOZENGE | OROMUCOSAL | Status: DC | PRN

## 2024-01-13 MED ORDER — DOCUSATE SODIUM 100 MG PO CAPS
100.0000 mg | ORAL_CAPSULE | Freq: Two times a day (BID) | ORAL | Status: DC
  Administered 2024-01-13 – 2024-01-16 (×6): 100 mg via ORAL
  Filled 2024-01-13 (×6): qty 1

## 2024-01-13 MED ORDER — POVIDONE-IODINE 10 % EX SWAB: 2.0000 | Freq: Once | CUTANEOUS | Status: DC

## 2024-01-13 MED ORDER — ACETAMINOPHEN 500 MG PO TABS
1000.0000 mg | ORAL_TABLET | Freq: Once | ORAL | Status: AC
  Administered 2024-01-13: 1000 mg via ORAL
  Filled 2024-01-13: qty 2

## 2024-01-13 MED ORDER — CHLORHEXIDINE GLUCONATE 0.12 % MT SOLN
15.0000 mL | Freq: Once | OROMUCOSAL | Status: AC
  Administered 2024-01-13: 15 mL via OROMUCOSAL

## 2024-01-13 MED ORDER — PROPOFOL 10 MG/ML IV BOLUS
INTRAVENOUS | Status: DC | PRN
  Administered 2024-01-13 (×2): 20 mg via INTRAVENOUS
  Administered 2024-01-13: 100 ug/kg/min via INTRAVENOUS

## 2024-01-13 MED ORDER — TAMSULOSIN HCL 0.4 MG PO CAPS
0.4000 mg | ORAL_CAPSULE | Freq: Every day | ORAL | Status: DC
  Administered 2024-01-13: 0.4 mg via ORAL
  Filled 2024-01-13 (×2): qty 1

## 2024-01-13 MED ORDER — OXYBUTYNIN CHLORIDE ER 5 MG PO TB24
10.0000 mg | ORAL_TABLET | Freq: Every day | ORAL | Status: DC
  Administered 2024-01-13: 10 mg via ORAL
  Filled 2024-01-13: qty 2

## 2024-01-13 MED ORDER — HYDROMORPHONE HCL 1 MG/ML IJ SOLN
0.5000 mg | INTRAMUSCULAR | Status: DC | PRN
  Administered 2024-01-13 – 2024-01-15 (×3): 1 mg via INTRAVENOUS
  Filled 2024-01-13 (×2): qty 1

## 2024-01-13 MED ORDER — FENTANYL CITRATE (PF) 50 MCG/ML IJ SOSY
50.0000 ug | PREFILLED_SYRINGE | INTRAMUSCULAR | Status: DC
  Administered 2024-01-13: 50 ug via INTRAVENOUS
  Filled 2024-01-13: qty 2

## 2024-01-13 MED ORDER — METHOCARBAMOL 500 MG PO TABS
500.0000 mg | ORAL_TABLET | Freq: Four times a day (QID) | ORAL | Status: DC | PRN
  Administered 2024-01-13 – 2024-01-14 (×2): 500 mg via ORAL
  Filled 2024-01-13: qty 1

## 2024-01-13 MED ORDER — ONDANSETRON HCL 4 MG/2ML IJ SOLN: 4.0000 mg | Freq: Four times a day (QID) | INTRAMUSCULAR | Status: DC | PRN

## 2024-01-13 MED ORDER — OXYCODONE HCL 5 MG PO TABS
5.0000 mg | ORAL_TABLET | ORAL | Status: DC | PRN
  Administered 2024-01-14 – 2024-01-16 (×4): 10 mg via ORAL
  Filled 2024-01-13 (×5): qty 2

## 2024-01-13 MED ORDER — METOCLOPRAMIDE HCL 5 MG PO TABS: 5.0000 mg | ORAL_TABLET | Freq: Three times a day (TID) | ORAL | Status: DC | PRN

## 2024-01-13 MED ORDER — BUPIVACAINE-EPINEPHRINE (PF) 0.25% -1:200000 IJ SOLN
INTRAMUSCULAR | Status: AC
  Filled 2024-01-13: qty 30

## 2024-01-13 MED ORDER — METHOCARBAMOL 1000 MG/10ML IJ SOLN: 500.0000 mg | Freq: Four times a day (QID) | INTRAMUSCULAR | Status: DC | PRN

## 2024-01-13 MED ORDER — METOCLOPRAMIDE HCL 5 MG/ML IJ SOLN: 5.0000 mg | Freq: Three times a day (TID) | INTRAMUSCULAR | Status: DC | PRN

## 2024-01-13 MED ORDER — MIDAZOLAM HCL (PF) 2 MG/2ML IJ SOLN: 1.0000 mg | INTRAMUSCULAR | Status: DC

## 2024-01-13 MED ORDER — OXYCODONE HCL 5 MG PO TABS: 5.0000 mg | ORAL_TABLET | Freq: Once | ORAL | Status: DC | PRN

## 2024-01-13 MED ORDER — FENTANYL CITRATE (PF) 50 MCG/ML IJ SOSY: 25.0000 ug | PREFILLED_SYRINGE | INTRAMUSCULAR | Status: DC | PRN

## 2024-01-13 MED ORDER — ACETAMINOPHEN 325 MG PO TABS
325.0000 mg | ORAL_TABLET | Freq: Four times a day (QID) | ORAL | Status: DC | PRN
  Administered 2024-01-14 – 2024-01-16 (×2): 650 mg via ORAL
  Filled 2024-01-13 (×2): qty 2

## 2024-01-13 MED ORDER — SODIUM CHLORIDE 0.9 % IR SOLN
Status: DC | PRN
  Administered 2024-01-13: 1000 mL

## 2024-01-13 MED ORDER — DROPERIDOL 2.5 MG/ML IJ SOLN: 0.6250 mg | Freq: Once | INTRAMUSCULAR | Status: DC | PRN

## 2024-01-13 MED ORDER — POLYETHYLENE GLYCOL 3350 17 G PO PACK: 17.0000 g | PACK | Freq: Every day | ORAL | Status: DC | PRN

## 2024-01-13 MED ORDER — ORAL CARE MOUTH RINSE: 15.0000 mL | Freq: Once | OROMUCOSAL | Status: AC

## 2024-01-13 MED ORDER — BUPIVACAINE-EPINEPHRINE (PF) 0.5% -1:200000 IJ SOLN
INTRAMUSCULAR | Status: DC | PRN
  Administered 2024-01-13: 15 mL via PERINEURAL

## 2024-01-13 MED ORDER — ONDANSETRON HCL 4 MG PO TABS: 4.0000 mg | ORAL_TABLET | Freq: Four times a day (QID) | ORAL | Status: DC | PRN

## 2024-01-13 MED ORDER — PHENOL 1.4 % MT LIQD: 1.0000 | OROMUCOSAL | Status: DC | PRN

## 2024-01-13 MED ORDER — OXYCODONE HCL 5 MG/5ML PO SOLN: 5.0000 mg | Freq: Once | ORAL | Status: DC | PRN

## 2024-01-13 MED ORDER — OXYCODONE HCL 5 MG PO TABS
10.0000 mg | ORAL_TABLET | ORAL | Status: DC | PRN
  Administered 2024-01-13: 10 mg via ORAL
  Administered 2024-01-14: 15 mg via ORAL
  Administered 2024-01-14: 10 mg via ORAL
  Administered 2024-01-14 (×2): 15 mg via ORAL
  Administered 2024-01-15 – 2024-01-16 (×3): 10 mg via ORAL
  Filled 2024-01-13 (×4): qty 2
  Filled 2024-01-13 (×5): qty 3

## 2024-01-13 SURGICAL SUPPLY — 55 items
BAG COUNTER SPONGE SURGICOUNT (BAG) IMPLANT
BAG ZIPLOCK 12X15 (MISCELLANEOUS) ×1 IMPLANT
BENZOIN TINCTURE PRP APPL 2/3 (GAUZE/BANDAGES/DRESSINGS) IMPLANT
BLADE SAG 18X100X1.27 (BLADE) ×1 IMPLANT
BLADE SAW SGTL 11.0X1.19X90.0M (BLADE) IMPLANT
BNDG ELASTIC 6INX 5YD STR LF (GAUZE/BANDAGES/DRESSINGS) ×2 IMPLANT
BNDG ELASTIC 6X10 VLCR STRL LF (GAUZE/BANDAGES/DRESSINGS) IMPLANT
BOWL SMART MIX CTS (DISPOSABLE) IMPLANT
CEMENT BONE R 1X40 (Cement) IMPLANT
COMPONENT FEM CMT PRNSA SZ10RT (Joint) IMPLANT
COOLER ICEMAN CLASSIC (MISCELLANEOUS) ×1 IMPLANT
COVER SURGICAL LIGHT HANDLE (MISCELLANEOUS) ×1 IMPLANT
CUFF TRNQT CYL 34X4.125X (TOURNIQUET CUFF) ×1 IMPLANT
DRAPE U-SHAPE 47X51 STRL (DRAPES) ×1 IMPLANT
DURAPREP 26ML APPLICATOR (WOUND CARE) ×1 IMPLANT
ELECT BLADE TIP CTD 4 INCH (ELECTRODE) ×1 IMPLANT
ELECT PENCIL ROCKER SW 15FT (MISCELLANEOUS) ×1 IMPLANT
ELECT REM PT RETURN 15FT ADLT (MISCELLANEOUS) ×1 IMPLANT
GAUZE PAD ABD 8X10 STRL (GAUZE/BANDAGES/DRESSINGS) ×2 IMPLANT
GAUZE SPONGE 4X4 12PLY STRL (GAUZE/BANDAGES/DRESSINGS) ×1 IMPLANT
GAUZE XEROFORM 1X8 LF (GAUZE/BANDAGES/DRESSINGS) IMPLANT
GLOVE BIO SURGEON STRL SZ7.5 (GLOVE) ×1 IMPLANT
GLOVE BIOGEL PI IND STRL 8 (GLOVE) ×2 IMPLANT
GLOVE SURG ORTHO 8.0 STRL STRW (GLOVE) ×1 IMPLANT
GOWN STRL REUS W/ TWL XL LVL3 (GOWN DISPOSABLE) ×2 IMPLANT
HOLDER FOLEY CATH W/STRAP (MISCELLANEOUS) IMPLANT
IMMOBILIZER KNEE 20 THIGH 36 (SOFTGOODS) ×1 IMPLANT
INSERT TIB ASF PS 8-11 GH RT (Insert) IMPLANT
KIT TURNOVER KIT A (KITS) ×1 IMPLANT
MANIFOLD NEPTUNE II (INSTRUMENTS) ×1 IMPLANT
MAT HALF PREVALON HALF STRYKER (MISCELLANEOUS) IMPLANT
NS IRRIG 1000ML POUR BTL (IV SOLUTION) ×1 IMPLANT
PACK TOTAL KNEE CUSTOM (KITS) ×1 IMPLANT
PAD COLD SHLDR WRAP-ON (PAD) ×1 IMPLANT
PADDING CAST COTTON 6X4 STRL (CAST SUPPLIES) ×2 IMPLANT
PENCIL SMOKE EVACUATOR (MISCELLANEOUS) IMPLANT
PIN DRILL HDLS TROCAR 75 4PK (PIN) IMPLANT
PROTECTOR NERVE ULNAR (MISCELLANEOUS) ×1 IMPLANT
SCREW FEMALE HEX FIX 25X2.5 (ORTHOPEDIC DISPOSABLE SUPPLIES) IMPLANT
SET HNDPC FAN SPRY TIP SCT (DISPOSABLE) ×1 IMPLANT
SET PAD KNEE POSITIONER (MISCELLANEOUS) ×1 IMPLANT
SOLN STERILE WATER BTL 1000 ML (IV SOLUTION) ×2 IMPLANT
SPIKE FLUID TRANSFER (MISCELLANEOUS) IMPLANT
STAPLER SKIN PROX 35W (STAPLE) IMPLANT
STEM POLY PAT PLY 35M KNEE (Knees) IMPLANT
STEM TIB ST PERS 14+30 (Stem) IMPLANT
STEM TIBIA 5 DEG SZ G R KNEE (Knees) IMPLANT
STRIP CLOSURE SKIN 1/2X4 (GAUZE/BANDAGES/DRESSINGS) IMPLANT
SUT MNCRL AB 4-0 PS2 18 (SUTURE) IMPLANT
SUT VIC AB 0 CT1 36 (SUTURE) ×1 IMPLANT
SUT VIC AB 1 CT1 36 (SUTURE) ×2 IMPLANT
SUT VIC AB 2-0 CT1 TAPERPNT 27 (SUTURE) ×2 IMPLANT
TOWEL GREEN STERILE FF (TOWEL DISPOSABLE) ×1 IMPLANT
TRAY FOLEY MTR SLVR 16FR STAT (SET/KITS/TRAYS/PACK) IMPLANT
YANKAUER SUCT BULB TIP NO VENT (SUCTIONS) ×1 IMPLANT

## 2024-01-13 NOTE — Evaluation (Signed)
 Physical Therapy Evaluation Patient Details Name: Keith Watkins MRN: [id number] DOB: Oct 08, 1945 Today's Date: 01/13/2024  History of Present Illness  Pt s/p R TKR and with hx of L TKR and lumbar spinal stenosis  Clinical Impression  Pt s/p R TKR and presents with decreased R LE strength/ROM, generalized weakness/deconditioning, post op pain, and obesity limiting functional mobility.  Pt hopes to progress to dc to dtrs home with assist of family and follow up HHPT.        If plan is discharge home, recommend the following: Two people to help with walking and/or transfers;A lot of help with bathing/dressing/bathroom;Assistance with cooking/housework;Assist for transportation;Help with stairs or ramp for entrance   Can travel by private vehicle        Equipment Recommendations None recommended by PT  Recommendations for Other Services       Functional Status Assessment Patient has had a recent decline in their functional status and demonstrates the ability to make significant improvements in function in a reasonable and predictable amount of time.     Precautions / Restrictions Precautions Precautions: Knee;Fall Required Braces or Orthoses: Knee Immobilizer - Right Knee Immobilizer - Right: Discontinue once straight leg raise with < 10 degree lag Restrictions Weight Bearing Restrictions Per Provider Order: Yes RLE Weight Bearing Per Provider Order: Weight bearing as tolerated      Mobility  Bed Mobility Overal bed mobility: Needs Assistance Bed Mobility: Supine to Sit, Sit to Supine     Supine to sit: Mod assist, Used rails Sit to supine: Mod assist, Used rails   General bed mobility comments: cues for sequence and use of UEs to self assist.    Transfers Overall transfer level: Needs assistance Equipment used: Rolling walker (2 wheels) Transfers: Sit to/from Stand Sit to Stand: From elevated surface, Mod assist           General transfer comment: Cues for LE  management and use of UEs to self assist    Ambulation/Gait Ambulation/Gait assistance: Min assist, Mod assist, +2 physical assistance, +2 safety/equipment Gait Distance (Feet): 3 Feet Assistive device: Rolling walker (2 wheels) Gait Pattern/deviations: Step-to pattern, Decreased step length - right, Decreased step length - left, Shuffle, Trunk flexed Gait velocity: decr     General Gait Details: Pt tolerated side shuffle along EOB only - limited by pain and deconditioning  Stairs            Wheelchair Mobility     Tilt Bed    Modified Rankin (Stroke Patients Only)       Balance Overall balance assessment: Needs assistance Sitting-balance support: No upper extremity supported, Feet supported Sitting balance-Leahy Scale: Good     Standing balance support: Bilateral upper extremity supported Standing balance-Leahy Scale: Poor                               Pertinent Vitals/Pain Pain Assessment Pain Assessment: 0-10 Pain Score: 8  Pain Location: R knee Pain Descriptors / Indicators: Aching, Sore, Grimacing Pain Intervention(s): Limited activity within patient's tolerance, Monitored during session, Premedicated before session, Patient requesting pain meds-RN notified, Ice applied    Home Living Family/patient expects to be discharged to:: Private residence Living Arrangements: Spouse/significant other Available Help at Discharge: Family Type of Home: House Home Access: Stairs to enter Entrance Stairs-Rails: Right Entrance Stairs-Number of Steps: 4   Home Layout: One level Home Equipment: Agricultural Consultant (2 wheels);Rollator (4 wheels);Cane - single point;Crutches;BSC/3in1 Additional  Comments: Pt plans to d/c to daughter's home initially with 4 STE and railing on R side    Prior Function Prior Level of Function : Independent/Modified Independent             Mobility Comments: use of cane as needed       Extremity/Trunk Assessment   Upper  Extremity Assessment Upper Extremity Assessment: Generalized weakness    Lower Extremity Assessment Lower Extremity Assessment: Generalized weakness;RLE deficits/detail;LLE deficits/detail LLE Deficits / Details: this is my weak leg, even before I had that knee replacement last year    Cervical / Trunk Assessment Cervical / Trunk Assessment: Normal  Communication   Communication Communication: No apparent difficulties    Cognition Arousal: Alert Behavior During Therapy: WFL for tasks assessed/performed   PT - Cognitive impairments: No apparent impairments                         Following commands: Intact       Cueing Cueing Techniques: Verbal cues     General Comments      Exercises Total Joint Exercises Ankle Circles/Pumps: AROM, Both, 15 reps, Supine   Assessment/Plan    PT Assessment Patient needs continued PT services  PT Problem List Decreased strength;Decreased range of motion;Decreased activity tolerance;Decreased balance;Decreased mobility;Decreased knowledge of use of DME;Pain       PT Treatment Interventions DME instruction;Gait training;Stair training;Functional mobility training;Therapeutic activities;Therapeutic exercise;Balance training;Patient/family education    PT Goals (Current goals can be found in the Care Plan section)  Acute Rehab PT Goals Patient Stated Goal: Regain IND PT Goal Formulation: With patient Time For Goal Achievement: 01/27/24 Potential to Achieve Goals: Fair    Frequency 7X/week     Co-evaluation               AM-PAC PT 6 Clicks Mobility  Outcome Measure Help needed turning from your back to your side while in a flat bed without using bedrails?: A Lot Help needed moving from lying on your back to sitting on the side of a flat bed without using bedrails?: A Lot Help needed moving to and from a bed to a chair (including a wheelchair)?: A Lot Help needed standing up from a chair using your arms (e.g.,  wheelchair or bedside chair)?: A Lot Help needed to walk in hospital room?: Total Help needed climbing 3-5 steps with a railing? : Total 6 Click Score: 10    End of Session Equipment Utilized During Treatment: Gait belt;Right knee immobilizer Activity Tolerance: Patient limited by fatigue;Patient limited by pain Patient left: in bed;with call bell/phone within reach;with bed alarm set;with nursing/sitter in room;with family/visitor present Nurse Communication: Mobility status PT Visit Diagnosis: Difficulty in walking, not elsewhere classified (R26.2);Muscle weakness (generalized) (M62.81);Pain Pain - Right/Left: Right Pain - part of body: Knee    Time: 8340-8266 PT Time Calculation (min) (ACUTE ONLY): 34 min   Charges:   PT Evaluation $PT Eval Low Complexity: 1 Low PT Treatments $Therapeutic Activity: 8-22 mins PT General Charges $$ ACUTE PT VISIT: 1 Visit         [facility] PT Acute Rehabilitation Services Office [phone]   Chey Cho 01/13/2024, 5:53 PM

## 2024-01-13 NOTE — Interval H&P Note (Signed)
 History and Physical Interval Note: Keith Watkins is here today for a right total knee replacement to treat his significant right knee pain and arthritis.  There has been no acute or interval change in his medical status.  The risks and benefits of surgery have been discussed in detail and informed consent has been obtained.  The right operative knee has been marked.  01/13/2024 6:50 AM  Keith Watkins  has presented today for surgery, with the diagnosis of osteoarthritis right knee.  The various methods of treatment have been discussed with the patient and family. After consideration of risks, benefits and other options for treatment, the patient has consented to  Procedures: ARTHROPLASTY, KNEE, TOTAL (Right) as a surgical intervention.  The patient's history has been reviewed, patient examined, no change in status, stable for surgery.  I have reviewed the patient's chart and labs.  Questions were answered to the patient's satisfaction.     Keith Watkins

## 2024-01-13 NOTE — Op Note (Signed)
 "    Operative Note  Date of operation: 01/13/2024 Preoperative diagnosis: Right knee primary osteoarthritis Postoperative diagnosis: Same  Procedure: Right cemented total knee arthroplasty  Implants: Biomet/Zimmer persona cemented knee system (CR medial congruent) Implant Name Type Inv. Item Serial No. Manufacturer Lot No. LRB No. Used Action  CEMENT BONE R 1X40 - [id number] Cement CEMENT BONE R 1X40  ZIMMER RECON(ORTH,TRAU,BIO,SG) [id number] Right 2 Implanted  STEM TIBIA 5 DEG SZ G R KNEE - [id number] Knees STEM TIBIA 5 DEG SZ G R KNEE  ZIMMER RECON(ORTH,TRAU,BIO,SG) [id number] Right 1 Implanted  STEM TIB ST PERS 14+30 - [id number] Stem STEM TIB ST PERS 14+30  ZIMMER RECON(ORTH,TRAU,BIO,SG) [id number] Right 1 Implanted  COMPONENT FEM CMT PRNSA [id number] - [id number] Joint COMPONENT FEM CMT PRNSA SZ10RT  ZIMMER RECON(ORTH,TRAU,BIO,SG) [id number] Right 1 Implanted  INSERT TIB ASF PS 8-11 GH RT - [id number] Insert INSERT TIB ASF PS 8-11 GH RT  ZIMMER RECON(ORTH,TRAU,BIO,SG) [id number] Right 1 Implanted  STEM POLY PAT PLY 87M KNEE - [id number] Knees STEM POLY PAT PLY 87M KNEE  ZIMMER RECON(ORTH,TRAU,BIO,SG) [id number] Right 1 Implanted   Surgeon: Lonni GRADE. Vernetta, MD Assistant: Almeda Rummer, PA-C  Anesthesia: #1 right lower extremity adductor canal block, under 2 spinal, #3 local Antibiotics: IV Ancef  Tourniquet time under 1 hour EBL: Less than 50 cc Complications: None  Indications: Keith Watkins is a 79 year old gentleman well-known to me.  He has debilitating arthritis involving his right knee.  He is in need of knee replacement surgery.  He has tried and failed all forms conservative treatment for over a year now.  At this point his right knee pain is daily and it is detrimentally affecting his mobility, his quality of life and his actives daily living.  About a year ago we replaced his left knee and that is done very well.  Having had knee replacement surgery before on the left side he is fully aware of  the risks and benefits of the surgery including the risk of acute blood loss anemia, nerve or vessel injury, fracture, infection, DVT, implant failure and wound healing issues.  He understands that our goals are hopefully decreased pain, improved mobility and improve quality of life.  Procedure description: After informed consent was obtained and the appropriate  right knee was marked, a right lower extremity adductor canal block was performed in the holding room by anesthesia.  The patient was then brought to the operating room and set up on the operative table where spinal anesthesia was obtained.  He was then laid in spine position on the operating table and a Foley catheter is placed.  A nonsterile tractors placed on his upper right thigh and his right thigh, knee, leg and ankle were prepped and draped with DuraPrep and sterile drapes including a sterile stockinette.  A timeout was called and he was identified as great patient the correct right knee.  An Esmarch was used to wrap of the leg and the tourniquet was plated 300 mm of pressure.  With the knee extended a direct medial incision was made over the patella and carried proximally distally.  Dissection was carried down to the knee joint and a medial parapatellar arthrotomy is made finding a large joint effusion.  With the knee in a flexed position we found significant cartilage wear throughout the knee in all 3 compartments.  The ACL was as well as remanence of the medial and lateral meniscus were removed.  Osteophytes were removed from all 3 compartments.  Next we used an extramedullary  cutting guide for making her proximal tibia cut correcting for varus and valgus and a 5 degree slope.  We made this cut to take 2 mm off the low side.  We then used the intramedullary base cutting guide through the notch of the femur for our distal femur cut setting this for right knee at 5 degrees externally rotated and a 10 mm distal femoral cut and we did back it down  to more millimeters.  We then brought the knee back down to full extension and had achieved full extension with a 10 degree extension block.  We then went back to the femur and put a femoral sizing guide based off of the epicondylar axis.  Based off this we chose a size 10 femur.  We put a 4-in-1 cutting block for a size 10 femur and made our anterior and posterior cuts followed by her chamfer cuts.  We then backed the tibia and chose a size G right tibial tray for coverage over the tibial plateau so the rotation of the tibial tubercle and the femur.  We did our drill hole/post and keel punch off of this.  We then trialed a size G right tibia combined with her size 10 right CR standard femur.  We trialed up to a 12 mm thickness right medial congruent path and insert and we are actually pleased with range of motion and stability with this insert.  We then made a patella cut drilled 3L for a size 35 patella button.  Again with all trial instrumentation the knee we replaced the range of motion stability.  All trial instrumentation was then removed and we irrigate the knee with normal saline solution using pulsatile lavage.  Marcaine  with epinephrine  was then placed around the arthrotomy while the cement was mixed.  Next with the knee in a flexed position and dried we cemented our Biomet/Zimmer persona tibial tray for a right knee size G followed by cementing our size 10 right CR standard femur.  We placed our 12 mm thickness right medial congruent polythene insert and cemented our size 35 patella button.  We then held the knee fully extended and compressed while the cemented hardened.  Once the cement hardened the tourniquet was let down hemostasis was obtained with electrocautery.  The arthrotomy was then closed with interrupted #1 Vicryl suture followed by 0 Vicryl goes deep tissue and 2-0 Vicryl to close subcutaneous tissue.  The skin was closed with staples.  Well-padded sterile dressing is applied.  The patient  was taken the recovery in in stable condition.  He did have an episode of going in and out of A-fib so this was monitored by anesthesia.  Postoperatively if we need to get cardiology involved we will.  Almeda Rummer, PA-C did assist in the interrogation beginning and her assistance was crucial and medically necessary for soft tissue management and retraction, helping guide implant placement and a layered closure of the wound. "

## 2024-01-13 NOTE — Anesthesia Procedure Notes (Signed)
 Anesthesia Regional Block: Adductor canal block   Pre-Anesthetic Checklist: , timeout performed,  Correct Patient, Correct Site, Correct Laterality,  Correct Procedure, Correct Position, site marked,  Risks and benefits discussed,  Pre-op evaluation,  At surgeon's request and post-op pain management  Laterality: Right  Prep: Maximum Sterile Barrier Precautions used, chloraprep       Needles:  Injection technique: Single-shot  Needle Type: Echogenic Stimulator Needle     Needle Length: 9cm  Needle Gauge: 22     Additional Needles:   Procedures:,,,, ultrasound used (permanent image in chart),,    Narrative:  Start time: 01/13/2024 7:50 AM End time: 01/13/2024 7:53 AM Injection made incrementally with aspirations every 5 mL.  Performed by: Personally  Anesthesiologist: Paul Lamarr BRAVO, MD  Additional Notes: Risks, benefits, and alternative discussed. Patient gave consent for procedure. Patient prepped and draped in sterile fashion. Sedation administered, patient remains easily responsive to voice. Relevant anatomy identified with ultrasound guidance. Local anesthetic given in 5cc increments with no signs or symptoms of intravascular injection. No pain or paraesthesias with injection. Patient monitored throughout procedure with no signs of LAST or immediate complications. Tolerated well. Ultrasound image placed in chart.  LANEY Paul, MD

## 2024-01-13 NOTE — Anesthesia Procedure Notes (Signed)
 Spinal  Patient location during procedure: OR Start time: 01/13/2024 8:40 AM End time: 01/13/2024 8:43 AM Reason for block: surgical anesthesia  Staffing Performed: anesthesiologist  Authorized by: Paul Lamarr BRAVO, MD   Performed by: Paul Lamarr BRAVO, MD  Preanesthetic Checklist Completed: patient identified, IV checked, risks and benefits discussed, surgical consent, monitors and equipment checked, pre-op evaluation and timeout performed Spinal Block Patient position: sitting Prep: DuraPrep and site prepped and draped Patient monitoring: continuous pulse ox, blood pressure and heart rate Approach: midline Location: L3-4 Injection technique: single-shot Needle Needle type: Pencan  Needle gauge: 24 G Needle length: 9 cm Assessment Events: CSF return  Additional Notes Risks, benefits, and alternative discussed. Patient gave consent to procedure. Prepped and draped in sitting position. Patient sedated but responsive to voice. Clear CSF obtained after one needle pass. Positive terminal aspiration. No pain or paraesthesias with injection. Patient tolerated procedure well. Vital signs stable. LANEY Paul, MD

## 2024-01-13 NOTE — Plan of Care (Signed)
" °  Problem: Education: Goal: Knowledge of the prescribed therapeutic regimen will improve Outcome: Progressing   Problem: Bowel/Gastric: Goal: Gastrointestinal status for postoperative course will improve Outcome: Progressing   Problem: Cardiac: Goal: Ability to maintain an adequate cardiac output Outcome: Progressing Goal: Will show no evidence of cardiac arrhythmias Outcome: Progressing   Problem: Nutritional: Goal: Will attain and maintain optimal nutritional status Outcome: Progressing   Problem: Neurological: Goal: Will regain or maintain usual level of consciousness Outcome: Progressing   Problem: Clinical Measurements: Goal: Ability to maintain clinical measurements within normal limits Outcome: Progressing Goal: Postoperative complications will be avoided or minimized Outcome: Progressing   Problem: Respiratory: Goal: Will regain and/or maintain adequate ventilation Outcome: Progressing Goal: Respiratory status will improve Outcome: Progressing   Problem: Skin Integrity: Goal: Demonstrates signs of wound healing without infection Outcome: Progressing   Problem: Urinary Elimination: Goal: Will remain free from infection Outcome: Progressing Goal: Ability to achieve and maintain adequate urine output Outcome: Progressing   Problem: Education: Goal: Knowledge of General Education information will improve Description: Including pain rating scale, medication(s)/side effects and non-pharmacologic comfort measures Outcome: Progressing   Problem: Health Behavior/Discharge Planning: Goal: Ability to manage health-related needs will improve Outcome: Progressing   Problem: Clinical Measurements: Goal: Ability to maintain clinical measurements within normal limits will improve Outcome: Progressing Goal: Will remain free from infection Outcome: Progressing Goal: Diagnostic test results will improve Outcome: Progressing Goal: Respiratory complications will  improve Outcome: Progressing Goal: Cardiovascular complication will be avoided Outcome: Progressing   Problem: Activity: Goal: Risk for activity intolerance will decrease Outcome: Progressing   Problem: Nutrition: Goal: Adequate nutrition will be maintained Outcome: Progressing   Problem: Coping: Goal: Level of anxiety will decrease Outcome: Progressing   Problem: Elimination: Goal: Will not experience complications related to bowel motility Outcome: Progressing Goal: Will not experience complications related to urinary retention Outcome: Progressing   Problem: Pain Managment: Goal: General experience of comfort will improve and/or be controlled Outcome: Progressing   Problem: Safety: Goal: Ability to remain free from injury will improve Outcome: Progressing   Problem: Skin Integrity: Goal: Risk for impaired skin integrity will decrease Outcome: Progressing   Problem: Education: Goal: Knowledge of the prescribed therapeutic regimen will improve Outcome: Progressing Goal: Individualized Educational Video(s) Outcome: Progressing   Problem: Activity: Goal: Ability to avoid complications of mobility impairment will improve Outcome: Progressing Goal: Range of joint motion will improve Outcome: Progressing   Problem: Clinical Measurements: Goal: Postoperative complications will be avoided or minimized Outcome: Progressing   Problem: Pain Management: Goal: Pain level will decrease with appropriate interventions Outcome: Progressing   Problem: Skin Integrity: Goal: Will show signs of wound healing Outcome: Progressing   "

## 2024-01-13 NOTE — Anesthesia Postprocedure Evaluation (Signed)
"   Anesthesia Post Note  Patient: Keith Watkins  Procedure(s) Performed: ARTHROPLASTY, KNEE, TOTAL (Right: Knee)     Patient location during evaluation: PACU Anesthesia Type: Spinal Level of consciousness: awake and alert Pain management: pain level controlled Vital Signs Assessment: post-procedure vital signs reviewed and stable Respiratory status: spontaneous breathing, nonlabored ventilation and respiratory function stable Cardiovascular status: blood pressure returned to baseline Postop Assessment: no apparent nausea or vomiting, spinal receding, no headache and no backache Anesthetic complications: no   No notable events documented.  Last Vitals:  Vitals:   01/13/24 1245 01/13/24 1300  BP: 126/80 136/89  Pulse: (!) 56 64  Resp: 12 14  Temp: (!) 36.3 C   SpO2: 100% 100%    Last Pain:  Vitals:   01/13/24 1230  TempSrc:   PainSc: 0-No pain                 Vertell Row      "

## 2024-01-13 NOTE — Transfer of Care (Signed)
 Immediate Anesthesia Transfer of Care Note  Patient: Keith Watkins  Procedure(s) Performed: ARTHROPLASTY, KNEE, TOTAL (Right: Knee)  Patient Location: PACU  Anesthesia Type:MAC and Spinal  Level of Consciousness: drowsy  Airway & Oxygen Therapy: Patient connected to face mask oxygen  Post-op Assessment: Report given to RN and Post -op Vital signs reviewed and stable  Post vital signs: Reviewed and stable  Last Vitals:  Vitals Value Taken Time  BP 123/103 01/13/24 10:45  Temp    Pulse 74 01/13/24 10:46  Resp 13 01/13/24 10:46  SpO2 97 % 01/13/24 10:46  Vitals shown include unfiled device data.  Last Pain:  Vitals:   01/13/24 0704  TempSrc:   PainSc: 0-No pain         Complications: No notable events documented.

## 2024-01-14 DIAGNOSIS — M1711 Unilateral primary osteoarthritis, right knee: Secondary | ICD-10-CM | POA: Diagnosis not present

## 2024-01-14 LAB — BASIC METABOLIC PANEL WITH GFR
Anion gap: 5 (ref 5–15)
BUN: 21 mg/dL (ref 8–23)
CO2: 27 mmol/L (ref 22–32)
Calcium: 8.6 mg/dL — ABNORMAL LOW (ref 8.9–10.3)
Chloride: 104 mmol/L (ref 98–111)
Creatinine, Ser: 0.92 mg/dL (ref 0.61–1.24)
GFR, Estimated: >60 mL/min
Glucose, Bld: 127 mg/dL — ABNORMAL HIGH (ref 70–99)
Potassium: 5 mmol/L (ref 3.5–5.1)
Sodium: 136 mmol/L (ref 135–145)

## 2024-01-14 LAB — CBC
HCT: 39.7 % (ref 39.0–52.0)
Hemoglobin: 13 g/dL (ref 13.0–17.0)
MCH: 30.7 pg (ref 26.0–34.0)
MCHC: 32.7 g/dL (ref 30.0–36.0)
MCV: 93.9 fL (ref 80.0–100.0)
Platelets: 172 K/uL (ref 150–400)
RBC: 4.23 MIL/uL (ref 4.22–5.81)
RDW: 13.5 % (ref 11.5–15.5)
WBC: 7.7 K/uL (ref 4.0–10.5)
nRBC: 0 % (ref 0.0–0.2)

## 2024-01-14 MED ORDER — TIZANIDINE HCL 4 MG PO TABS
4.0000 mg | ORAL_TABLET | Freq: Four times a day (QID) | ORAL | Status: DC | PRN
  Administered 2024-01-14 – 2024-01-15 (×4): 4 mg via ORAL
  Filled 2024-01-14 (×4): qty 1

## 2024-01-14 MED ORDER — TAMSULOSIN HCL 0.4 MG PO CAPS
0.4000 mg | ORAL_CAPSULE | Freq: Every day | ORAL | Status: DC
  Administered 2024-01-14 – 2024-01-15 (×2): 0.4 mg via ORAL
  Filled 2024-01-14 (×2): qty 1

## 2024-01-14 MED ORDER — KETOROLAC TROMETHAMINE 15 MG/ML IJ SOLN
7.5000 mg | Freq: Four times a day (QID) | INTRAMUSCULAR | Status: AC
  Administered 2024-01-14 (×3): 7.5 mg via INTRAVENOUS
  Filled 2024-01-14 (×3): qty 1

## 2024-01-14 MED ORDER — ASPIRIN 81 MG PO CHEW: 81.0000 mg | CHEWABLE_TABLET | Freq: Two times a day (BID) | ORAL | 0 refills | Status: AC

## 2024-01-14 MED ORDER — OXYCODONE HCL 5 MG PO TABS: 5.0000 mg | ORAL_TABLET | ORAL | 0 refills | Status: DC | PRN

## 2024-01-14 MED ORDER — OXYBUTYNIN CHLORIDE ER 5 MG PO TB24
10.0000 mg | ORAL_TABLET | Freq: Two times a day (BID) | ORAL | Status: DC
  Administered 2024-01-14 – 2024-01-16 (×4): 10 mg via ORAL
  Filled 2024-01-14 (×4): qty 2

## 2024-01-14 MED ORDER — SODIUM CHLORIDE 0.9 % IV SOLN: INTRAVENOUS | Status: AC

## 2024-01-14 MED ORDER — TIZANIDINE HCL 4 MG PO TABS: 4.0000 mg | ORAL_TABLET | Freq: Four times a day (QID) | ORAL | 0 refills | Status: DC | PRN

## 2024-01-14 MED ORDER — OXYBUTYNIN CHLORIDE ER 5 MG PO TB24
10.0000 mg | ORAL_TABLET | Freq: Two times a day (BID) | ORAL | Status: DC
  Administered 2024-01-14: 10 mg via ORAL
  Filled 2024-01-14: qty 2

## 2024-01-14 NOTE — Progress Notes (Signed)
 Physical Therapy Treatment Patient Details Name: Keith Watkins MRN: [id number] DOB: 1945-11-09 Today's Date: 01/14/2024   History of Present Illness Pt s/p R TKR and with hx of L TKR and lumbar spinal stenosis    PT Comments  Pt continues very cooperative and reports improved pain control but noted to have mild slurring of words and with decreased BP but pt denies any symptoms of hypotension.  Pt with continued progress with mobility including increased distance ambulated and decreasing level of assist for most tasks.  BP on L arm supine 96/64; sit 94/66; stand 96/66 and after ambulating 37' 99/63.  Pt pleased with progress.    If plan is discharge home, recommend the following: Two people to help with walking and/or transfers;A lot of help with bathing/dressing/bathroom;Assistance with cooking/housework;Assist for transportation;Help with stairs or ramp for entrance   Can travel by private vehicle        Equipment Recommendations  None recommended by PT    Recommendations for Other Services       Precautions / Restrictions Precautions Precautions: Knee;Fall Required Braces or Orthoses: Knee Immobilizer - Right Knee Immobilizer - Right: Discontinue once straight leg raise with < 10 degree lag Restrictions Weight Bearing Restrictions Per Provider Order: Yes RLE Weight Bearing Per Provider Order: Weight bearing as tolerated     Mobility  Bed Mobility Overal bed mobility: Needs Assistance Bed Mobility: Supine to Sit, Sit to Supine     Supine to sit: Min assist, Used rails Sit to supine: Min assist, Mod assist, Used rails   General bed mobility comments: cues for sequence and use of  L LE to self assist.    Transfers Overall transfer level: Needs assistance Equipment used: Rolling walker (2 wheels) Transfers: Sit to/from Stand Sit to Stand: From elevated surface, Min assist, +2 physical assistance, +2 safety/equipment           General transfer comment: Cues for LE  management and use of UEs to self assist    Ambulation/Gait Ambulation/Gait assistance: Min assist, +2 safety/equipment, +2 physical assistance Gait Distance (Feet): 37 Feet Assistive device: Rolling walker (2 wheels) Gait Pattern/deviations: Step-to pattern, Decreased step length - right, Decreased step length - left, Shuffle, Trunk flexed Gait velocity: decr     General Gait Details: Increased time with cues for sequence, posture and position from RW; distance ltd by pain/fatigue   Stairs             Wheelchair Mobility     Tilt Bed    Modified Rankin (Stroke Patients Only)       Balance Overall balance assessment: Needs assistance Sitting-balance support: No upper extremity supported, Feet supported Sitting balance-Leahy Scale: Good     Standing balance support: Bilateral upper extremity supported Standing balance-Leahy Scale: Poor                              Communication Communication Communication: No apparent difficulties  Cognition Arousal: Alert Behavior During Therapy: WFL for tasks assessed/performed   PT - Cognitive impairments: No apparent impairments                         Following commands: Intact      Cueing Cueing Techniques: Verbal cues  Exercises Total Joint Exercises Ankle Circles/Pumps: AROM, Both, 15 reps, Supine Quad Sets: AROM, Both, 10 reps, Supine Heel Slides: AAROM, Right, 10 reps, Supine Straight Leg Raises: AAROM, Right, 10  reps, Supine    General Comments        Pertinent Vitals/Pain Pain Assessment Pain Assessment: 0-10 Pain Score: 4  Pain Location: R knee Pain Descriptors / Indicators: Aching, Sore, Grimacing Pain Intervention(s): Limited activity within patient's tolerance, Monitored during session, Premedicated before session, Ice applied    Home Living                          Prior Function            PT Goals (current goals can now be found in the care plan  section) Acute Rehab PT Goals Patient Stated Goal: Regain IND PT Goal Formulation: With patient Time For Goal Achievement: 01/27/24 Potential to Achieve Goals: Fair Progress towards PT goals: Progressing toward goals    Frequency    7X/week      PT Plan      Co-evaluation              AM-PAC PT 6 Clicks Mobility   Outcome Measure  Help needed turning from your back to your side while in a flat bed without using bedrails?: A Lot Help needed moving from lying on your back to sitting on the side of a flat bed without using bedrails?: A Lot Help needed moving to and from a bed to a chair (including a wheelchair)?: A Lot Help needed standing up from a chair using your arms (e.g., wheelchair or bedside chair)?: A Lot Help needed to walk in hospital room?: A Little Help needed climbing 3-5 steps with a railing? : Total 6 Click Score: 12    End of Session Equipment Utilized During Treatment: Gait belt;Right knee immobilizer Activity Tolerance: Patient limited by fatigue;Patient limited by pain Patient left: in bed;with call bell/phone within reach;with bed alarm set;with nursing/sitter in room;with family/visitor present Nurse Communication: Mobility status PT Visit Diagnosis: Difficulty in walking, not elsewhere classified (R26.2);Muscle weakness (generalized) (M62.81);Pain Pain - Right/Left: Right Pain - part of body: Knee     Time: 8395-8366 PT Time Calculation (min) (ACUTE ONLY): 29 min  Charges:    $Gait Training: 8-22 mins $Therapeutic Exercise: 8-22 mins $Therapeutic Activity: 8-22 mins PT General Charges $$ ACUTE PT VISIT: 1 Visit                     [facility] PT Acute Rehabilitation Services Office [phone]    Eleanna Theilen 01/14/2024, 4:39 PM

## 2024-01-14 NOTE — Progress Notes (Signed)
 Physical Therapy Treatment Patient Details Name: Keith Watkins MRN: [id number] DOB: 1945-04-15 Today's Date: 01/14/2024   History of Present Illness Pt s/p R TKR and with hx of L TKR and lumbar spinal stenosis    PT Comments  Pt very cooperative but apprehensive 2* pain overnight and earlier this am.  With increased time, therex program initiated, pt up to sitting, to standing, and ambulated limited distance in room(several standing rest breaks to complete task.  Sitting up in chair not attempted 2* low height of chairs and pts current limitations - pt uses lift chair at home.    If plan is discharge home, recommend the following: Two people to help with walking and/or transfers;A lot of help with bathing/dressing/bathroom;Assistance with cooking/housework;Assist for transportation;Help with stairs or ramp for entrance   Can travel by private vehicle        Equipment Recommendations  None recommended by PT    Recommendations for Other Services       Precautions / Restrictions Precautions Precautions: Knee;Fall Required Braces or Orthoses: Knee Immobilizer - Right Knee Immobilizer - Right: Discontinue once straight leg raise with < 10 degree lag Restrictions Weight Bearing Restrictions Per Provider Order: Yes RLE Weight Bearing Per Provider Order: Weight bearing as tolerated     Mobility  Bed Mobility Overal bed mobility: Needs Assistance Bed Mobility: Supine to Sit, Sit to Supine     Supine to sit: Min assist, Mod assist, Used rails Sit to supine: Min assist, Mod assist, Used rails   General bed mobility comments: cues for sequence and use of  L LE to self assist.    Transfers Overall transfer level: Needs assistance Equipment used: Rolling walker (2 wheels) Transfers: Sit to/from Stand Sit to Stand: From elevated surface, Mod assist           General transfer comment: Cues for LE management and use of UEs to self assist    Ambulation/Gait Ambulation/Gait  assistance: Min assist, +2 safety/equipment, +2 physical assistance Gait Distance (Feet): 22 Feet Assistive device: Rolling walker (2 wheels) Gait Pattern/deviations: Step-to pattern, Decreased step length - right, Decreased step length - left, Shuffle, Trunk flexed Gait velocity: decr     General Gait Details: Increased time with cues for sequence, posture and position from RW; distance ltd by pain/fatigue   Stairs             Wheelchair Mobility     Tilt Bed    Modified Rankin (Stroke Patients Only)       Balance Overall balance assessment: Needs assistance Sitting-balance support: No upper extremity supported, Feet supported Sitting balance-Leahy Scale: Good     Standing balance support: Bilateral upper extremity supported Standing balance-Leahy Scale: Poor                              Communication Communication Communication: No apparent difficulties  Cognition Arousal: Alert Behavior During Therapy: WFL for tasks assessed/performed   PT - Cognitive impairments: No apparent impairments                         Following commands: Intact      Cueing Cueing Techniques: Verbal cues  Exercises Total Joint Exercises Ankle Circles/Pumps: AROM, Both, 15 reps, Supine Quad Sets: AROM, Both, 10 reps, Supine Heel Slides: AAROM, Right, 10 reps, Supine Straight Leg Raises: AAROM, Right, 10 reps, Supine    General Comments  Pertinent Vitals/Pain Pain Assessment Pain Assessment: 0-10 Pain Score: 6  Pain Location: R knee Pain Descriptors / Indicators: Aching, Sore, Grimacing Pain Intervention(s): Limited activity within patient's tolerance, Monitored during session, Premedicated before session, Ice applied    Home Living                          Prior Function            PT Goals (current goals can now be found in the care plan section) Acute Rehab PT Goals Patient Stated Goal: Regain IND PT Goal Formulation:  With patient Time For Goal Achievement: 01/27/24 Potential to Achieve Goals: Fair Progress towards PT goals: Progressing toward goals    Frequency    7X/week      PT Plan      Co-evaluation              AM-PAC PT 6 Clicks Mobility   Outcome Measure  Help needed turning from your back to your side while in a flat bed without using bedrails?: A Lot Help needed moving from lying on your back to sitting on the side of a flat bed without using bedrails?: A Lot Help needed moving to and from a bed to a chair (including a wheelchair)?: A Lot Help needed standing up from a chair using your arms (e.g., wheelchair or bedside chair)?: A Lot Help needed to walk in hospital room?: A Lot Help needed climbing 3-5 steps with a railing? : Total 6 Click Score: 11    End of Session Equipment Utilized During Treatment: Gait belt;Right knee immobilizer Activity Tolerance: Patient limited by fatigue;Patient limited by pain Patient left: in bed;with call bell/phone within reach;with bed alarm set;with nursing/sitter in room;with family/visitor present Nurse Communication: Mobility status PT Visit Diagnosis: Difficulty in walking, not elsewhere classified (R26.2);Muscle weakness (generalized) (M62.81);Pain Pain - Right/Left: Right Pain - part of body: Knee     Time: 8944-8861 PT Time Calculation (min) (ACUTE ONLY): 43 min  Charges:    $Gait Training: 8-22 mins $Therapeutic Exercise: 8-22 mins $Therapeutic Activity: 8-22 mins PT General Charges $$ ACUTE PT VISIT: 1 Visit                     [facility] PT Acute Rehabilitation Services Office [phone]    Ruford Dudzinski 01/14/2024, 2:02 PM

## 2024-01-14 NOTE — Progress Notes (Signed)
 Subjective: 1 Day Post-Op Procedures (LRB): ARTHROPLASTY, KNEE, TOTAL (Right) Patient reports pain as 10 on 0-10 scale.    Objective: Vital signs in last 24 hours: Temp:  [96.2 F (35.7 C)-98.4 F (36.9 C)] 98.2 F (36.8 C) (01/10 0851) Pulse Rate:  [56-86] 86 (01/10 0851) Resp:  [9-18] 18 (01/10 0851) BP: (101-141)/(62-89) 118/73 (01/10 0851) SpO2:  [70 %-100 %] 94 % (01/10 0901) Weight:  [125.2 kg] 125.2 kg (01/09 1555)  Intake/Output from previous day: 01/09 0701 - 01/10 0700 In: 836.9 [I.V.:801.7; IV Piggyback:35.2] Out: 905 [Urine:875; Blood:30] Intake/Output this shift: Total I/O In: 998.9 [I.V.:998.9] Out: -   Recent Labs    01/13/24 1051 01/14/24 0313  HGB 12.6* 13.0   Recent Labs    01/13/24 1051 01/14/24 0313  WBC 4.6 7.7  RBC 4.20* 4.23  HCT 39.0 39.7  PLT 184 172   Recent Labs    01/13/24 0719 01/14/24 0313  NA 141 136  K 3.6 5.0  CL 107 104  CO2 25 27  BUN 20 21  CREATININE 0.84 0.92  GLUCOSE 116* 127*  CALCIUM 8.5* 8.6*   No results for input(s): LABPT, INR in the last 72 hours.  Sensation intact distally Intact pulses distally Dorsiflexion/Plantar flexion intact Incision: dressing C/D/I Compartment soft   Assessment/Plan: 1 Day Post-Op Procedures (LRB): ARTHROPLASTY, KNEE, TOTAL (Right) Up with therapy Plan for discharge tomorrow Discharge home with home health  Will work on pain control for today.  Toradol  ordered.    Lonni CINDERELLA Poli 01/14/2024, 11:29 AM

## 2024-01-14 NOTE — Discharge Instructions (Signed)
 Per [facility] clinic policy, our goal is ensure optimal postoperative pain control with a multimodal pain management strategy. For all [facility] patients, our goal is to wean post-operative narcotic medications by 6 weeks post-operatively. If this is not possible due to utilization of pain medication prior to surgery, your [facility] doctor will support your acute post-operative pain control for the first 6 weeks postoperatively, with a plan to transition you back to your primary pain team following that. Keith Watkins will work to ensure a Therapist, occupational.  INSTRUCTIONS AFTER JOINT REPLACEMENT   Remove items at home which could result in a fall. This includes throw rugs or furniture in walking pathways ICE to the affected joint every three hours while awake for 30 minutes at a time, for at least the first 3-5 days, and then as needed for pain and swelling.  Continue to use ice for pain and swelling. You may notice swelling that will progress down to the foot and ankle.  This is normal after surgery.  Elevate your leg when you are not up walking on it.   Continue to use the breathing machine you got in the hospital (incentive spirometer) which will help keep your temperature down.  It is common for your temperature to cycle up and down following surgery, especially at night when you are not up moving around and exerting yourself.  The breathing machine keeps your lungs expanded and your temperature down.   DIET:  As you were doing prior to hospitalization, we recommend a well-balanced diet.  DRESSING / WOUND CARE / SHOWERING  Keep the surgical dressing until follow up.  The dressing is water  proof, so you can shower without any extra covering.  IF THE DRESSING FALLS OFF or the wound gets wet inside, change the dressing with sterile gauze.  Please use good hand washing techniques before changing the dressing.  Do not use any lotions or creams on the incision until instructed by your surgeon.     ACTIVITY  Increase activity slowly as tolerated, but follow the weight bearing instructions below.   No driving for 6 weeks or until further direction given by your physician.  You cannot drive while taking narcotics.  No lifting or carrying greater than 10 lbs. until further directed by your surgeon. Avoid periods of inactivity such as sitting longer than an hour when not asleep. This helps prevent blood clots.  You may return to work once you are authorized by your doctor.     WEIGHT BEARING   Weight bearing as tolerated with assist device (walker, cane, etc) as directed, use it as long as suggested by your surgeon or therapist, typically at least 4-6 weeks.   EXERCISES  Results after joint replacement surgery are often greatly improved when you follow the exercise, range of motion and muscle strengthening exercises prescribed by your doctor. Safety measures are also important to protect the joint from further injury. Any time any of these exercises cause you to have increased pain or swelling, decrease what you are doing until you are comfortable again and then slowly increase them. If you have problems or questions, call your caregiver or physical therapist for advice.   Rehabilitation is important following a joint replacement. After just a few days of immobilization, the muscles of the leg can become weakened and shrink (atrophy).  These exercises are designed to build up the tone and strength of the thigh and leg muscles and to improve motion. Often times heat used for twenty to thirty minutes before  working out will loosen up your tissues and help with improving the range of motion but do not use heat for the first two weeks following surgery (sometimes heat can increase post-operative swelling).   These exercises can be done on a training (exercise) mat, on the floor, on a table or on a bed. Use whatever works the best and is most comfortable for you.    Use music or television  while you are exercising so that the exercises are a pleasant break in your day. This will make your life better with the exercises acting as a break in your routine that you can look forward to.   Perform all exercises about fifteen times, three times per day or as directed.  You should exercise both the operative leg and the other leg as well.  Exercises include:   Quad Sets - Tighten up the muscle on the front of the thigh (Quad) and hold for 5-10 seconds.   Straight Leg Raises - With your knee straight (if you were given a brace, keep it on), lift the leg to 60 degrees, hold for 3 seconds, and slowly lower the leg.  Perform this exercise against resistance later as your leg gets stronger.  Leg Slides: Lying on your back, slowly slide your foot toward your buttocks, bending your knee up off the floor (only go as far as is comfortable). Then slowly slide your foot back down until your leg is flat on the floor again.  Angel Wings: Lying on your back spread your legs to the side as far apart as you can without causing discomfort.  Hamstring Strength:  Lying on your back, push your heel against the floor with your leg straight by tightening up the muscles of your buttocks.  Repeat, but this time bend your knee to a comfortable angle, and push your heel against the floor.  You may put a pillow under the heel to make it more comfortable if necessary.   A rehabilitation program following joint replacement surgery can speed recovery and prevent re-injury in the future due to weakened muscles. Contact your doctor or a physical therapist for more information on knee rehabilitation.    CONSTIPATION  Constipation is defined medically as fewer than three stools per week and severe constipation as less than one stool per week.  Even if you have a regular bowel pattern at home, your normal regimen is likely to be disrupted due to multiple reasons following surgery.  Combination of anesthesia, postoperative  narcotics, change in appetite and fluid intake all can affect your bowels.   YOU MUST use at least one of the following options; they are listed in order of increasing strength to get the job done.  They are all available over the counter, and you may need to use some, POSSIBLY even all of these options:    Drink plenty of fluids (prune juice may be helpful) and high fiber foods Colace 100 mg by mouth twice a day  Senokot for constipation as directed and as needed Dulcolax (bisacodyl), take with full glass of water  Miralax (polyethylene glycol) once or twice a day as needed.  If you have tried all these things and are unable to have a bowel movement in the first 3-4 days after surgery call either your surgeon or your primary doctor.    If you experience loose stools or diarrhea, hold the medications until you stool forms back up.  If your symptoms do not get better within 1 week  or if they get worse, check with your doctor.  If you experience the worst abdominal pain ever or develop nausea or vomiting, please contact the office immediately for further recommendations for treatment.   ITCHING:  If you experience itching with your medications, try taking only a single pain pill, or even half a pain pill at a time.  You can also use Benadryl  over the counter for itching or also to help with sleep.   TED HOSE STOCKINGS:  Use stockings on both legs until for at least 2 weeks or as directed by physician office. They may be removed at night for sleeping.  MEDICATIONS:  See your medication summary on the "After Visit Summary" that nursing will review with you.  You may have some home medications which will be placed on hold until you complete the course of blood thinner medication.  It is important for you to complete the blood thinner medication as prescribed.  PRECAUTIONS:  If you experience chest pain or shortness of breath - call 911 immediately for transfer to the hospital emergency department.    If you develop a fever greater that 101 F, purulent drainage from wound, increased redness or drainage from wound, foul odor from the wound/dressing, or calf pain - CONTACT YOUR SURGEON.                                                   FOLLOW-UP APPOINTMENTS:  If you do not already have a post-op appointment, please call the office for an appointment to be seen by your surgeon.  Guidelines for how soon to be seen are listed in your "After Visit Summary", but are typically between 1-4 weeks after surgery.  OTHER INSTRUCTIONS:   Knee Replacement:  Do not place pillow under knee, focus on keeping the knee straight while resting. CPM instructions: 0-90 degrees, 2 hours in the morning, 2 hours in the afternoon, and 2 hours in the evening. Place foam block, curve side up under heel at all times except when in CPM or when walking.  DO NOT modify, tear, cut, or change the foam block in any way.  POST-OPERATIVE OPIOID TAPER INSTRUCTIONS: It is important to wean off of your opioid medication as soon as possible. If you do not need pain medication after your surgery it is ok to stop day one. Opioids include: Codeine, Hydrocodone(Norco, Vicodin), Oxycodone (Percocet, oxycontin ) and hydromorphone  amongst others.  Long term and even short term use of opiods can cause: Increased pain response Dependence Constipation Depression Respiratory depression And more.  Withdrawal symptoms can include Flu like symptoms Nausea, vomiting And more Techniques to manage these symptoms Hydrate well Eat regular healthy meals Stay active Use relaxation techniques(deep breathing, meditating, yoga) Do Not substitute Alcohol to help with tapering If you have been on opioids for less than two weeks and do not have pain than it is ok to stop all together.  Plan to wean off of opioids This plan should start within one week post op of your joint replacement. Maintain the same interval or time between taking each dose  and first decrease the dose.  Cut the total daily intake of opioids by one tablet each day Next start to increase the time between doses. The last dose that should be eliminated is the evening dose.   MAKE SURE YOU:  Understand these instructions.  Get help right away if you are not doing well or get worse.    Thank you for letting us  be a part of your medical care team.  It is a privilege we respect greatly.  We hope these instructions will help you stay on track for a fast and full recovery!     Dental Antibiotics:  In most cases prophylactic antibiotics for Dental procdeures after total joint surgery are not necessary.  Exceptions are as follows:  1. History of prior total joint infection  2. Severely immunocompromised (Organ Transplant, cancer chemotherapy, Rheumatoid biologic meds such as Humera)  3. Poorly controlled diabetes (A1C &gt; 8.0, blood glucose over 200)  If you have one of these conditions, contact your surgeon for an antibiotic prescription, prior to your dental procedure.

## 2024-01-14 NOTE — Plan of Care (Signed)
  Problem: Education: Goal: Knowledge of General Education information will improve Description: Including pain rating scale, medication(s)/side effects and non-pharmacologic comfort measures Outcome: Progressing   Problem: Health Behavior/Discharge Planning: Goal: Ability to manage health-related needs will improve Outcome: Progressing   Problem: Clinical Measurements: Goal: Ability to maintain clinical measurements within normal limits will improve Outcome: Progressing   Problem: Activity: Goal: Risk for activity intolerance will decrease Outcome: Progressing   Problem: Nutrition: Goal: Adequate nutrition will be maintained Outcome: Progressing   Problem: Elimination: Goal: Will not experience complications related to bowel motility Outcome: Progressing   Problem: Pain Managment: Goal: General experience of comfort will improve and/or be controlled Outcome: Progressing   Problem: Safety: Goal: Ability to remain free from injury will improve Outcome: Progressing   Problem: Skin Integrity: Goal: Risk for impaired skin integrity will decrease Outcome: Progressing

## 2024-01-15 DIAGNOSIS — M1711 Unilateral primary osteoarthritis, right knee: Secondary | ICD-10-CM | POA: Diagnosis not present

## 2024-01-15 NOTE — Care Management Obs Status (Signed)
 MEDICARE OBSERVATION STATUS NOTIFICATION   Patient Details  Name: Keith Watkins MRN: [id number] Date of Birth: [birth year]   Medicare Observation Status Notification Given:  Yes    Alex Leahy LILLETTE Fenton, LCSW 01/15/2024, 10:32 AM

## 2024-01-15 NOTE — Progress Notes (Signed)
" °  Subjective: Keith Watkins is a 79 y.o. male s/p right TKA.  They are POD2.  Pt's pain is controlled.  Pt denies any complain of chest pain, shortness of breath, abdominal pain, calf pain.  Patient denies any fevers or chills. Amb about 40 feet with PT yesterday and did have some relatively asymptomatic hypotension with therapy.  Was able to ambulate even further with PT onto the hall today.  Has not done stair training yet.  Has 5 stairs to get into his house.  Objective: Vital signs in last 24 hours: Temp:  [97.7 F (36.5 C)-98.4 F (36.9 C)] 98.4 F (36.9 C) (01/11 0945) Pulse Rate:  [74-92] 89 (01/11 0945) Resp:  [17-20] 20 (01/10 1452) BP: (90-131)/(48-70) 115/48 (01/11 0945) SpO2:  [95 %-99 %] 99 % (01/11 0945)  Intake/Output from previous day: 01/10 0701 - 01/11 0700 In: 1058.9 [P.O.:60; I.V.:998.9] Out: 500 [Urine:500] Intake/Output this shift: Total I/O In: -  Out: 100 [Urine:100]  Exam:  No gross blood or drainage overlying the dressing 2+ DP pulse Sensation intact distally in the operative foot Able to dorsiflex and plantarflex the operative foot No calf tenderness.  Negative Homans' sign. Unable to perform straight leg raise   Labs: Recent Labs    01/13/24 1051 01/14/24 0313  HGB 12.6* 13.0   Recent Labs    01/13/24 1051 01/14/24 0313  WBC 4.6 7.7  RBC 4.20* 4.23  HCT 39.0 39.7  PLT 184 172   Recent Labs    01/13/24 0719 01/14/24 0313  NA 141 136  K 3.6 5.0  CL 107 104  CO2 25 27  BUN 20 21  CREATININE 0.84 0.92  GLUCOSE 116* 127*  CALCIUM 8.5* 8.6*   No results for input(s): LABPT, INR in the last 72 hours.  Assessment/Plan: Pt is POD2 s/p TKA.    -Plan to discharge to home today or tomorrow pending patient's pain and PT eval  -WBAT with a walker  -Follow-up with Dr. Vernetta in clinic 2 weeks postoperatively    [facility] 01/15/2024, 10:42 AM       "

## 2024-01-15 NOTE — Plan of Care (Signed)
" °  Problem: Education: Goal: Knowledge of the prescribed therapeutic regimen will improve Outcome: Progressing   Problem: Pain Managment: Goal: General experience of comfort will improve and/or be controlled Outcome: Progressing   Problem: Activity: Goal: Ability to avoid complications of mobility impairment will improve Outcome: Progressing   "

## 2024-01-15 NOTE — Progress Notes (Signed)
 Physical Therapy Treatment Patient Details Name: Keith Watkins MRN: [id number] DOB: 13-May-1945 Today's Date: 01/15/2024   History of Present Illness Pt s/p R TKR and with hx of L TKR and lumbar spinal stenosis    PT Comments  Pt continues motivated and progressing steadily with mobility but requiring increased time and multiple rest breaks for all tasks.  Pt up to EOB sitting for use of urinal, up to hallway to ambulate increased distance and negotiated stairs with rail and crutch.  Pt pleased with progress and hopeful for dc home tomorrow.   If plan is discharge home, recommend the following: A lot of help with bathing/dressing/bathroom;Assistance with cooking/housework;Assist for transportation;Help with stairs or ramp for entrance;A lot of help with walking and/or transfers   Can travel by private vehicle        Equipment Recommendations  None recommended by PT    Recommendations for Other Services       Precautions / Restrictions Precautions Precautions: Knee;Fall Required Braces or Orthoses: Knee Immobilizer - Right Knee Immobilizer - Right: Discontinue once straight leg raise with < 10 degree lag Restrictions Weight Bearing Restrictions Per Provider Order: Yes RLE Weight Bearing Per Provider Order: Weight bearing as tolerated     Mobility  Bed Mobility Overal bed mobility: Needs Assistance Bed Mobility: Supine to Sit, Sit to Supine     Supine to sit: Min assist, Used rails Sit to supine: Min assist, Used rails   General bed mobility comments: cues for sequence and use of  L LE to self assist.    Transfers Overall transfer level: Needs assistance Equipment used: Rolling walker (2 wheels) Transfers: Sit to/from Stand Sit to Stand: From elevated surface, Min assist, +2 physical assistance, +2 safety/equipment           General transfer comment: Cues for LE management and use of UEs to self assist    Ambulation/Gait Ambulation/Gait assistance: Min assist, +2  safety/equipment Gait Distance (Feet): 80 Feet Assistive device: Rolling walker (2 wheels) Gait Pattern/deviations: Step-to pattern, Decreased step length - right, Decreased step length - left, Shuffle, Trunk flexed Gait velocity: decr     General Gait Details: Increased time with cues for sequence, posture and position from RW; distance ltd by pain/fatigue   Stairs Stairs: Yes Stairs assistance: Min assist, Mod assist, +2 physical assistance, +2 safety/equipment Stair Management: One rail Right, Step to pattern, Forwards, With crutches Number of Stairs: 3 General stair comments: cues for sequence, posture and position from RW; multiple standing rest breaks 2* fatigue   Wheelchair Mobility     Tilt Bed    Modified Rankin (Stroke Patients Only)       Balance Overall balance assessment: Needs assistance Sitting-balance support: No upper extremity supported, Feet supported Sitting balance-Leahy Scale: Good     Standing balance support: No upper extremity supported Standing balance-Leahy Scale: Fair                              Hotel Manager: No apparent difficulties  Cognition Arousal: Alert Behavior During Therapy: WFL for tasks assessed/performed   PT - Cognitive impairments: No apparent impairments                         Following commands: Intact      Cueing Cueing Techniques: Verbal cues  Exercises Total Joint Exercises Ankle Circles/Pumps: AROM, Both, 15 reps, Supine Quad Sets: AROM, Both, 10  reps, Supine Heel Slides: AAROM, Right, Supine, 15 reps Straight Leg Raises: AAROM, Right, Supine, 15 reps    General Comments        Pertinent Vitals/Pain Pain Assessment Pain Assessment: 0-10 Pain Score: 6  Pain Location: R knee Pain Descriptors / Indicators: Aching, Sore, Grimacing Pain Intervention(s): Limited activity within patient's tolerance, Monitored during session, Premedicated before session, Ice  applied    Home Living                          Prior Function            PT Goals (current goals can now be found in the care plan section) Acute Rehab PT Goals Patient Stated Goal: Regain IND PT Goal Formulation: With patient Time For Goal Achievement: 01/27/24 Potential to Achieve Goals: Fair Progress towards PT goals: Progressing toward goals    Frequency    7X/week      PT Plan      Co-evaluation              AM-PAC PT 6 Clicks Mobility   Outcome Measure  Help needed turning from your back to your side while in a flat bed without using bedrails?: A Lot Help needed moving from lying on your back to sitting on the side of a flat bed without using bedrails?: A Lot Help needed moving to and from a bed to a chair (including a wheelchair)?: A Lot Help needed standing up from a chair using your arms (e.g., wheelchair or bedside chair)?: A Lot Help needed to walk in hospital room?: A Little Help needed climbing 3-5 steps with a railing? : A Lot 6 Click Score: 13    End of Session Equipment Utilized During Treatment: Gait belt;Right knee immobilizer Activity Tolerance: Patient tolerated treatment well Patient left: in bed;with call bell/phone within reach;with bed alarm set;with nursing/sitter in room;with family/visitor present Nurse Communication: Mobility status PT Visit Diagnosis: Difficulty in walking, not elsewhere classified (R26.2);Muscle weakness (generalized) (M62.81);Pain Pain - Right/Left: Right Pain - part of body: Knee     Time: 1431-1515 PT Time Calculation (min) (ACUTE ONLY): 44 min  Charges:    $Gait Training: 23-37 mins $Therapeutic Exercise: 8-22 mins $Therapeutic Activity: 8-22 mins PT General Charges $$ ACUTE PT VISIT: 1 Visit                     [facility] PT Acute Rehabilitation Services Office [phone]    Aleecia Tapia 01/15/2024, 3:59 PM

## 2024-01-15 NOTE — Progress Notes (Signed)
 Physical Therapy Treatment Patient Details Name: Keith Watkins MRN: [id number] DOB: January 18, 1945 Today's Date: 01/15/2024   History of Present Illness Pt s/p R TKR and with hx of L TKR and lumbar spinal stenosis    PT Comments  Pt continues cooperative and progressing steadily with mobility with noted improvement in distance ambulated and levels of assist for most tasks.  Pt highly concerned about managing stairs bc its my good leg they did surgery on    If plan is discharge home, recommend the following: A lot of help with bathing/dressing/bathroom;Assistance with cooking/housework;Assist for transportation;Help with stairs or ramp for entrance;A lot of help with walking and/or transfers   Can travel by private vehicle        Equipment Recommendations  None recommended by PT    Recommendations for Other Services       Precautions / Restrictions Precautions Precautions: Knee;Fall Required Braces or Orthoses: Knee Immobilizer - Right Knee Immobilizer - Right: Discontinue once straight leg raise with < 10 degree lag Restrictions Weight Bearing Restrictions Per Provider Order: Yes RLE Weight Bearing Per Provider Order: Weight bearing as tolerated     Mobility  Bed Mobility Overal bed mobility: Needs Assistance Bed Mobility: Supine to Sit, Sit to Supine     Supine to sit: Min assist, Used rails Sit to supine: Min assist, Mod assist, Used rails   General bed mobility comments: cues for sequence and use of  L LE to self assist.    Transfers Overall transfer level: Needs assistance Equipment used: Rolling walker (2 wheels) Transfers: Sit to/from Stand Sit to Stand: From elevated surface, Min assist, +2 physical assistance, +2 safety/equipment           General transfer comment: Cues for LE management and use of UEs to self assist    Ambulation/Gait Ambulation/Gait assistance: Min assist, +2 safety/equipment, +2 physical assistance Gait Distance (Feet): 64  Feet Assistive device: Rolling walker (2 wheels) Gait Pattern/deviations: Step-to pattern, Decreased step length - right, Decreased step length - left, Shuffle, Trunk flexed Gait velocity: decr     General Gait Details: Increased time with cues for sequence, posture and position from RW; distance ltd by pain/fatigue   Stairs             Wheelchair Mobility     Tilt Bed    Modified Rankin (Stroke Patients Only)       Balance Overall balance assessment: Needs assistance Sitting-balance support: No upper extremity supported, Feet supported Sitting balance-Leahy Scale: Good     Standing balance support: No upper extremity supported Standing balance-Leahy Scale: Fair                              Hotel Manager: No apparent difficulties  Cognition Arousal: Alert Behavior During Therapy: WFL for tasks assessed/performed   PT - Cognitive impairments: No apparent impairments                         Following commands: Intact      Cueing Cueing Techniques: Verbal cues  Exercises Total Joint Exercises Ankle Circles/Pumps: AROM, Both, 15 reps, Supine Quad Sets: AROM, Both, 10 reps, Supine Heel Slides: AAROM, Right, Supine, 15 reps Straight Leg Raises: AAROM, Right, Supine, 15 reps    General Comments        Pertinent Vitals/Pain Pain Assessment Pain Assessment: 0-10 Pain Score: 5  Pain Location: R knee Pain Descriptors /  Indicators: Aching, Sore, Grimacing Pain Intervention(s): Limited activity within patient's tolerance, Monitored during session, Premedicated before session, Ice applied    Home Living                          Prior Function            PT Goals (current goals can now be found in the care plan section) Acute Rehab PT Goals Patient Stated Goal: Regain IND PT Goal Formulation: With patient Time For Goal Achievement: 01/27/24 Potential to Achieve Goals: Fair Progress towards  PT goals: Progressing toward goals    Frequency    7X/week      PT Plan      Co-evaluation              AM-PAC PT 6 Clicks Mobility   Outcome Measure  Help needed turning from your back to your side while in a flat bed without using bedrails?: A Lot Help needed moving from lying on your back to sitting on the side of a flat bed without using bedrails?: A Lot Help needed moving to and from a bed to a chair (including a wheelchair)?: A Lot Help needed standing up from a chair using your arms (e.g., wheelchair or bedside chair)?: A Lot Help needed to walk in hospital room?: A Little Help needed climbing 3-5 steps with a railing? : Total 6 Click Score: 12    End of Session Equipment Utilized During Treatment: Gait belt;Right knee immobilizer Activity Tolerance: Patient tolerated treatment well Patient left: in bed;with call bell/phone within reach;with bed alarm set;with nursing/sitter in room;with family/visitor present Nurse Communication: Mobility status PT Visit Diagnosis: Difficulty in walking, not elsewhere classified (R26.2);Muscle weakness (generalized) (M62.81);Pain Pain - Right/Left: Right Pain - part of body: Knee     Time: 9195-9150 PT Time Calculation (min) (ACUTE ONLY): 45 min  Charges:    $Gait Training: 8-22 mins $Therapeutic Exercise: 8-22 mins $Therapeutic Activity: 8-22 mins PT General Charges $$ ACUTE PT VISIT: 1 Visit                     [facility] PT Acute Rehabilitation Services Office [phone]    Nailah Luepke 01/15/2024, 12:25 PM

## 2024-01-16 ENCOUNTER — Encounter (HOSPITAL_COMMUNITY): Payer: Self-pay | Admitting: Orthopaedic Surgery

## 2024-01-16 DIAGNOSIS — M1711 Unilateral primary osteoarthritis, right knee: Secondary | ICD-10-CM | POA: Diagnosis not present

## 2024-01-16 NOTE — Progress Notes (Signed)
 Subjective: 3 Days Post-Op Procedures (LRB): ARTHROPLASTY, KNEE, TOTAL (Right) Patient reports pain as moderate.  Slow mobility with therapy, but making progress.  Objective: Vital signs in last 24 hours: Temp:  [98.3 F (36.8 C)-98.5 F (36.9 C)] 98.3 F (36.8 C) (01/12 0612) Pulse Rate:  [89-100] 100 (01/12 0612) Resp:  [18] 18 (01/12 0612) BP: (92-128)/(48-82) 128/68 (01/12 0612) SpO2:  [96 %-99 %] 97 % (01/12 0612)  Intake/Output from previous day: 01/11 0701 - 01/12 0700 In: 360 [P.O.:360] Out: 650 [Urine:650] Intake/Output this shift: No intake/output data recorded.  Recent Labs    01/13/24 1051 01/14/24 0313  HGB 12.6* 13.0   Recent Labs    01/13/24 1051 01/14/24 0313  WBC 4.6 7.7  RBC 4.20* 4.23  HCT 39.0 39.7  PLT 184 172   Recent Labs    01/14/24 0313  NA 136  K 5.0  CL 104  CO2 27  BUN 21  CREATININE 0.92  GLUCOSE 127*  CALCIUM 8.6*   No results for input(s): LABPT, INR in the last 72 hours.  Sensation intact distally Intact pulses distally Dorsiflexion/Plantar flexion intact Incision: dressing C/D/I   Assessment/Plan: 3 Days Post-Op Procedures (LRB): ARTHROPLASTY, KNEE, TOTAL (Right) Up with therapy Discharge home with home health this afternoon.      Keith Watkins Poli 01/16/2024, 7:27 AM

## 2024-01-16 NOTE — Progress Notes (Signed)
 Physical Therapy Treatment Patient Details Name: Keith Watkins MRN: [id number] DOB: [birth year] Today's Date: 01/16/2024   History of Present Illness Pt s/p R TKR and with hx of L TKR and lumbar spinal stenosis    PT Comments  POD # 3 am session AxO x 3 motivated.  Slightly HOH and required repeat Instructions.  Lives home with Spouse but plans to D/C to daughter's house initially.  Had other knee replaced a year ago. Spouse and 1 of 3 Daughter was present. Assisted OOB.  General bed mobility comments: demonstarted and Educated on how to use a strap to self guide R LE off bed.  Pt plans to sleep in his LIFT CHAIR. Great difficulty performing supine to sit due to body habitus.  Assisted with standing to amb.  General transfer comment: required elevated surface and increased time with repeat effort to rise.  Pt is use to his LIFT CHAIR and admits to going up highest level.General Gait Details: This session, amb without KI with instructions to take shorter steps while assessing knee stability during stance phase of gait.  NO knee buckle seen.  Educated Pt and Family about progressing off KI today POD # 3 but advised to wear for stairs.  Pt tolerated amb a functional distance 42 feet.  Performed a few TE's followed HEP handout.  [facility] is [facility].  Will see Pt again for stair training.    If plan is discharge home, recommend the following: A lot of help with bathing/dressing/bathroom;Assistance with cooking/housework;Assist for transportation;Help with stairs or ramp for entrance;A lot of help with walking and/or transfers   Can travel by private vehicle        Equipment Recommendations  None recommended by PT    Recommendations for Other Services       Precautions / Restrictions Precautions Precautions: Knee;Fall Precaution/Restrictions Comments: NO pillow under knee, Instructed to wear KI for stairs Required Braces or Orthoses: Knee Immobilizer - Right Knee Immobilizer -  Right: Discontinue once straight leg raise with < 10 degree lag Restrictions Weight Bearing Restrictions Per Provider Order: No RLE Weight Bearing Per Provider Order: Weight bearing as tolerated     Mobility  Bed Mobility Overal bed mobility: Needs Assistance Bed Mobility: Supine to Sit     Supine to sit: Supervision, Contact guard     General bed mobility comments: demonstarted and Educated on how to use a strap to self guide R LE off bed.  Pt plans to sleep in his LIFT CHAIR.    Transfers Overall transfer level: Needs assistance Equipment used: Rolling walker (2 wheels) Transfers: Sit to/from Stand Sit to Stand: From elevated surface, Min assist           General transfer comment: required elevated surface and increased time with repeat effort to rise.  Pt is use to his LIFT CHAIR and admits to going up highest level.    Ambulation/Gait Ambulation/Gait assistance: Supervision, Contact guard assist Gait Distance (Feet): 42 Feet Assistive device: Rolling walker (2 wheels) Gait Pattern/deviations: Step-to pattern, Decreased step length - right, Decreased step length - left, Shuffle, Trunk flexed Gait velocity: decreased     General Gait Details: This session, amb without KI with instructions to take shorter steps while assessing knee stability during stance phase of gait.  NO knee buckle seen.  Educated Pt and Family about progressing off KI today POD # 3 but advised to wear for stairs.  Pt tolerated amb a functional distance 42 feet.  Stairs             Wheelchair Mobility     Tilt Bed    Modified Rankin (Stroke Patients Only)       Balance                                            Communication Communication Communication: No apparent difficulties Factors Affecting Communication: Hearing impaired  Cognition Arousal: Alert Behavior During Therapy: WFL for tasks assessed/performed   PT - Cognitive impairments: No apparent  impairments                       PT - Cognition Comments: AxO x 3 motivated.  Slightly HOH and required repeat Instructions.  Lives home with Spouse but plans to D/C to daughter's house initially.  Had other knee replaced a year ago. Following commands: Intact      Cueing Cueing Techniques: Verbal cues  Exercises  Total Knee Replacement TE's following HEP handout 10 reps B LE ankle pumps 05 reps towel squeezes 05 reps knee presses 05 reps heel slides  05 reps SAQ's 05 reps SLR's 05 reps ABD Educated on use of gait belt to assist with TE's Followed by ICE     General Comments        Pertinent Vitals/Pain Pain Assessment Pain Assessment: 0-10 Pain Score: 6  Pain Location: R knee Pain Descriptors / Indicators: Aching, Sore, Grimacing Pain Intervention(s): Monitored during session, Premedicated before session, Repositioned, Ice applied    Home Living                          Prior Function            PT Goals (current goals can now be found in the care plan section) Progress towards PT goals: Progressing toward goals    Frequency    7X/week      PT Plan      Co-evaluation              AM-PAC PT 6 Clicks Mobility   Outcome Measure  Help needed turning from your back to your side while in a flat bed without using bedrails?: A Little Help needed moving from lying on your back to sitting on the side of a flat bed without using bedrails?: A Little Help needed moving to and from a bed to a chair (including a wheelchair)?: A Little Help needed standing up from a chair using your arms (e.g., wheelchair or bedside chair)?: A Little Help needed to walk in hospital room?: A Little Help needed climbing 3-5 steps with a railing? : A Lot 6 Click Score: 17    End of Session Equipment Utilized During Treatment: Gait belt Activity Tolerance: Patient tolerated treatment well Patient left: in chair;with call bell/phone within reach;with  family/visitor present Nurse Communication: Mobility status PT Visit Diagnosis: Difficulty in walking, not elsewhere classified (R26.2);Muscle weakness (generalized) (M62.81);Pain Pain - Right/Left: Right Pain - part of body: Knee     Time: 1000-1040 PT Time Calculation (min) (ACUTE ONLY): 40 min  Charges:    $Gait Training: 8-22 mins $Therapeutic Exercise: 8-22 mins $Therapeutic Activity: 8-22 mins PT General Charges $$ ACUTE PT VISIT: 1 Visit                    Katheryn  [facility]  PTA Acute  Rehabilitation Services Office M-F          [phone]

## 2024-01-16 NOTE — Progress Notes (Signed)
 Physical Therapy Treatment Patient Details Name: Keith Watkins MRN: [id number] DOB: May 16, 1945 Today's Date: 01/16/2024   History of Present Illness Pt s/p R TKR and with hx of L TKR and lumbar spinal stenosis    PT Comments  POD # 3 pm session Session focused on stairs.  General transfer comment: Applied KI for stairs while Pt seated in recliner.  Advised to apply KI once legs are out of car and before attempting stairs at home.  From recliner, required several attempts as Pt is use to his LIFT CHAIR and R LE was fuly in extension with KI on.  General stair comments: this session, performed stairs up backward with walker and KI ON.  Performed twice as Spouse observered.  Also assisted Spouse with video recording on her phone for visual memory assist as Daughter who was earlier was NOT present.  Yesterday, Pt went up stairs forward with one crutch and one rail.  Pt verbalized he felf more comfortable up backward tech. Reviewed HEP.  Addressed all mobility questions, discussed appropriate activity, educated on use of ICE.  Pt ready for D/C to home.  Pt and Spouse stated we do not have a car here.  They are arranging transportation.  Advised Pt/Spouse to let nurse know what time he will be able to get a rise/car.     If plan is discharge home, recommend the following: A lot of help with bathing/dressing/bathroom;Assistance with cooking/housework;Assist for transportation;Help with stairs or ramp for entrance;A lot of help with walking and/or transfers   Can travel by private vehicle        Equipment Recommendations  None recommended by PT    Recommendations for Other Services       Precautions / Restrictions Precautions Precautions: Knee;Fall Precaution/Restrictions Comments: NO pillow under knee, Instructed to wear KI for stairs Required Braces or Orthoses: Knee Immobilizer - Right Knee Immobilizer - Right: Discontinue once straight leg raise with < 10 degree  lag Restrictions Weight Bearing Restrictions Per Provider Order: No RLE Weight Bearing Per Provider Order: Weight bearing as tolerated     Mobility  Bed Mobility   General bed mobility comments: Pt OOB in recliner    Transfers Overall transfer level: Needs assistance Equipment used: Rolling walker (2 wheels) Transfers: Sit to/from Stand Sit to Stand: From elevated surface, Min assist           General transfer comment: Applied KI for stairs while Pt seated in recliner.  Advised to apply KI once legs are out of car and before attempting stairs at home.  From recliner, required several attempts as Pt is use to his LIFT CHAIR and R LE was fuly in extension with KI on.  .    Ambulation/Gait Ambulation/Gait assistance: Supervision, Contact guard assist Gait Distance (Feet): 6 Feet Assistive device: Rolling walker (2 wheels) Gait Pattern/deviations: Step-to pattern, Decreased step length - right, Decreased step length - left, Shuffle, Trunk flexed Gait velocity: decreased     General Gait Details: decreased amb to and from stairs as STAIRS was main focus of session   Stairs Stairs: Yes Stairs assistance: Min assist Stair Management: No rails, Step to pattern, Backwards, With walker Number of Stairs: 2 General stair comments: this session, performed stairs up backward with walker and KI ON.  Performed twice as Spouse observered.  Also assisted Spouse with video recording on her phone for visual memory assist as Daughter who was earlier was NOT present.  Yesterday, Pt went up stairs forward with one  crutch and one rail.  Pt verbalized he felf more comfortable up backward tech.   Wheelchair Mobility     Tilt Bed    Modified Rankin (Stroke Patients Only)       Balance                                            Communication Communication Communication: No apparent difficulties Factors Affecting Communication: Hearing impaired  Cognition Arousal:  Alert Behavior During Therapy: WFL for tasks assessed/performed   PT - Cognitive impairments: No apparent impairments                       PT - Cognition Comments: AxO x 3 motivated.  Slightly HOH and required repeat Instructions.  Lives home with Spouse but plans to D/C to daughter's house initially.  Had other knee replaced a year ago. Following commands: Intact      Cueing Cueing Techniques: Verbal cues  Exercises      General Comments        Pertinent Vitals/Pain Pain Assessment Pain Assessment: 0-10 Pain Score: 6  Pain Location: R knee Pain Descriptors / Indicators: Aching, Sore, Grimacing Pain Intervention(s): Monitored during session, Premedicated before session, Repositioned, Ice applied    Home Living                          Prior Function            PT Goals (current goals can now be found in the care plan section) Progress towards PT goals: Progressing toward goals    Frequency    7X/week      PT Plan      Co-evaluation              AM-PAC PT 6 Clicks Mobility   Outcome Measure  Help needed turning from your back to your side while in a flat bed without using bedrails?: A Little Help needed moving from lying on your back to sitting on the side of a flat bed without using bedrails?: A Little Help needed moving to and from a bed to a chair (including a wheelchair)?: A Little Help needed standing up from a chair using your arms (e.g., wheelchair or bedside chair)?: A Little Help needed to walk in hospital room?: A Little Help needed climbing 3-5 steps with a railing? : A Little 6 Click Score: 18    End of Session Equipment Utilized During Treatment: Gait belt Activity Tolerance: Patient tolerated treatment well Patient left: in chair;with call bell/phone within reach;with family/visitor present Nurse Communication: Mobility status PT Visit Diagnosis: Difficulty in walking, not elsewhere classified (R26.2);Muscle  weakness (generalized) (M62.81);Pain Pain - Right/Left: Right Pain - part of body: Knee     Time: 8851-8785 PT Time Calculation (min) (ACUTE ONLY): 26 min  Charges:    $Gait Training: 8-22 mins $Therapeutic Activity: 8-22 mins PT General Charges $$ ACUTE PT VISIT: 1 Visit                    Katheryn Leap  PTA Acute  Rehabilitation Services Office M-F          [phone]

## 2024-01-16 NOTE — TOC Progression Note (Signed)
 Transition of Care [facility]) - Progression Note    Patient Details  Name: Keith Watkins MRN: [id number] Date of Birth: Apr 03, 1945  [facility] CM/SW Contact  Alfonse JONELLE Rex, RN Phone Number: 01/16/2024, 11:00 AM  Clinical Narrative:   Met with patient at bedside to review dc therapy and home equipment needs, pt confirmed HH PT [facility]), has RW. No CM needs      Barriers to Discharge: No Barriers Identified               Expected Discharge Plan and Services         Expected Discharge Date: 01/16/24               DME Arranged:  (Patient has RW, cane, rollator at his home already; no DME needed)         HH Arranged: PT HH Agency: [facility]         Social Drivers of Health (SDOH) Interventions SDOH Screenings   Food Insecurity: No Food Insecurity (01/13/2024)  Housing: Low Risk (01/13/2024)  Transportation Needs: No Transportation Needs (01/13/2024)  Utilities: Not At Risk (01/13/2024)  Alcohol Screen: Low Risk (07/16/2023)  Depression (PHQ2-9): Low Risk (10/04/2023)  Financial Resource Strain: Low Risk (07/16/2023)  Physical Activity: Inactive (07/16/2023)  Social Connections: Socially Integrated (01/13/2024)  Stress: No Stress Concern Present (07/16/2023)  Tobacco Use: Low Risk (01/13/2024)  Health Literacy: Adequate Health Literacy (07/20/2023)    Readmission Risk Interventions     No data to display

## 2024-01-16 NOTE — Discharge Summary (Signed)
 Patient ID: Keith Watkins MRN: [id number] DOB/AGE: 1945-08-16 78 y.o.  Admit date: 01/13/2024 Discharge date: 01/16/2024  Admission Diagnoses:  Principal Problem:   Unilateral primary osteoarthritis, right knee Active Problems:   Status post total right knee replacement   Discharge Diagnoses:  Same  Past Medical History:  Diagnosis Date   Arthritis    Back pain    Cataract    Cellulitis 07/31/2011   Complication of anesthesia    Pt wakes up too quickly   Enlarged prostate    Foot abscess, left 08/07/2011   Hypertension    prior to gastric bypass- no meds taken   Lumbar spinal stenosis    Obesity 08/09/2011    Surgeries: Procedures: ARTHROPLASTY, KNEE, TOTAL on 01/13/2024   Consultants:   Discharged Condition: Improved  Hospital Course: KENDELL Watkins is an 78 y.o. male who was admitted 01/13/2024 for operative treatment ofUnilateral primary osteoarthritis, right knee. Patient has severe unremitting pain that affects sleep, daily activities, and work/hobbies. After pre-op clearance the patient was taken to the operating room on 01/13/2024 and underwent  Procedures: ARTHROPLASTY, KNEE, TOTAL.    Patient was given perioperative antibiotics:  Anti-infectives (From admission, onward)    Start     Dose/Rate Route Frequency Ordered Stop   01/13/24 1600  ceFAZolin  (ANCEF ) IVPB 2g/100 mL premix        2 g 200 mL/hr over 30 Minutes Intravenous Every 6 hours 01/13/24 1504 01/13/24 2230   01/13/24 0700  ceFAZolin  (ANCEF ) IVPB 3g/150 mL premix        3 g 300 mL/hr over 30 Minutes Intravenous On call to O.R. 01/13/24 9349 01/13/24 0847        Patient was given sequential compression devices, early ambulation, and chemoprophylaxis to prevent DVT.  Inpatient Morphine  Milligram Equivalents Per Day 1/9 - 1/12   Values displayed are in units of MME/Day    Order Start / End Date 1/9 1/10 Yesterday Today    oxyCODONE  (Oxy IR/ROXICODONE ) immediate release tablet 5 mg 1/9 - 1/9 0 of Unknown  -- -- --    oxyCODONE  (ROXICODONE ) 5 MG/5ML solution 5 mg 1/9 - 1/9 0 of Unknown -- -- --      Group total: 0 of Unknown       fentaNYL  (SUBLIMAZE ) injection 25-50 mcg 1/9 - 1/9 0 of 45-90 -- -- --    fentaNYL  (SUBLIMAZE ) injection 50-100 mcg 1/9 - 1/9 15 of 15-30 -- -- --    HYDROmorphone  (DILAUDID ) injection 0.5-1 mg 1/9 - No end date 20 of 30-60 20 of 60-120 20 of 60-120 0 of 60-120    oxyCODONE  (Oxy IR/ROXICODONE ) immediate release tablet 5-10 mg 1/9 - No end date 0 of 22.5-45 15 of 45-90 30 of 45-90 15 of 45-90    oxyCODONE  (Oxy IR/ROXICODONE ) immediate release tablet 10-15 mg 1/9 - No end date 15 of 45-67.5 82.5 of 90-135 15 of 90-135 30 of 90-135    Daily Totals  50 of Unknown (at least 157.5-292.5) 117.5 of 195-345 65 of 195-345 45 of 195-345    Calculation Errors     Order Type Date Details   oxyCODONE  (Oxy IR/ROXICODONE ) immediate release tablet 5 mg Ordered Dose -- Insufficient frequency information   oxyCODONE  (ROXICODONE ) 5 MG/5ML solution 5 mg Ordered Dose -- Insufficient frequency information            Patient benefited maximally from hospital stay and there were no complications.    Recent vital signs: Patient Vitals for the  past 24 hrs:  BP Temp Temp src Pulse Resp SpO2  01/16/24 0612 128/68 98.3 F (36.8 C) Oral 100 18 97 %  01/15/24 2105 124/82 98.4 F (36.9 C) Oral 100 18 98 %     Recent laboratory studies:  Recent Labs    01/14/24 0313  WBC 7.7  HGB 13.0  HCT 39.7  PLT 172  NA 136  K 5.0  CL 104  CO2 27  BUN 21  CREATININE 0.92  GLUCOSE 127*  CALCIUM 8.6*     Discharge Medications:   Allergies as of 01/16/2024   No Known Allergies      Medication List     TAKE these medications    aspirin  81 MG chewable tablet Chew 1 tablet (81 mg total) by mouth 2 (two) times daily.   oxybutynin  10 MG 24 hr tablet Commonly known as: DITROPAN -XL TAKE 1 TABLET (10 MG TOTAL) BY MOUTH IN THE MORNING AND AT BEDTIME   oxyCODONE  5 MG immediate  release tablet Commonly known as: Oxy IR/ROXICODONE  Take 1-2 tablets (5-10 mg total) by mouth every 4 (four) hours as needed for moderate pain (pain score 4-6) (pain score 4-6).   tamsulosin  0.4 MG Caps capsule Commonly known as: FLOMAX  TAKE 1 CAPSULE BY MOUTH EVERYDAY AT BEDTIME   tiZANidine  4 MG tablet Commonly known as: ZANAFLEX  Take 1 tablet (4 mg total) by mouth every 6 (six) hours as needed for muscle spasms.               Durable Medical Equipment  (From admission, onward)           Start     Ordered   01/13/24 1505  DME 3 n 1  Once        01/13/24 1504   01/13/24 1505  DME Walker rolling  Once       Question Answer Comment  Walker: With 5 Inch Wheels   Patient needs a walker to treat with the following condition Status post total right knee replacement      01/13/24 1504            Diagnostic Studies: DG Knee Right Port Result Date: 01/13/2024 CLINICAL DATA:  Postop. EXAM: PORTABLE RIGHT KNEE - 1-2 VIEW COMPARISON:  Preoperative imaging FINDINGS: Right knee arthroplasty in expected alignment. No periprosthetic lucency or fracture. There has been patellar resurfacing. Recent postsurgical change includes air and edema in the soft tissues and joint space. Overlying skin staples in place. IMPRESSION: Right knee arthroplasty without immediate postoperative complication. Electronically Signed   By: Andrea Gasman M.D.   On: 01/13/2024 16:27    Disposition: Discharge disposition: 01-Home or Self Care          Follow-up Information     Keith Lonni GRADE, MD. Go on 01/26/2024.   Specialty: Orthopedic Surgery Why: at 1:30 pm for your first in office post op appointment with Dr. Vernetta Pass information: [address] [place] [address] [phone]                  Signed: Lonni Watkins Keith 01/16/2024, 3:35 PM

## 2024-01-16 NOTE — Progress Notes (Signed)
 Physical Therapy Treatment Patient Details Name: Keith Watkins MRN: [id number] DOB: [birth year] Today's Date: 01/16/2024   History of Present Illness Pt s/p R TKR and with hx of L TKR and lumbar spinal stenosis    PT Comments  Called to Pt's room by nursing stating they were unable to get Pt out of recliner.  Along with Mobility Specialist, assisted from recliner to standing + 2 side by side Min Assist.  Assisted with pulling up his shorts that were around his knees.  Assisted with amb from recliner to wheelchair a few feet.  Assisted to wheelchair for D/C.  Pillow placed under R LE due to limited knee flexion.  Addressed remaining mobility questions from spouse.     If plan is discharge home, recommend the following: A lot of help with bathing/dressing/bathroom;Assistance with cooking/housework;Assist for transportation;Help with stairs or ramp for entrance;A lot of help with walking and/or transfers   Can travel by private vehicle        Equipment Recommendations  None recommended by PT    Recommendations for Other Services       Precautions / Restrictions Precautions Precautions: Knee;Fall Precaution/Restrictions Comments: NO pillow under knee, Instructed to wear KI for stairs Required Braces or Orthoses: Knee Immobilizer - Right Knee Immobilizer - Right: Discontinue once straight leg raise with < 10 degree lag Restrictions Weight Bearing Restrictions Per Provider Order: No RLE Weight Bearing Per Provider Order: Weight bearing as tolerated     Mobility  Bed Mobility   General bed mobility comments: Pt OOB in recliner    Transfers Overall transfer level: Needs assistance Equipment used: Rolling walker (2 wheels) Transfers: Sit to/from Stand Sit to Stand: From lower recliner  surface, Min assist + 2 side by side with VC's on proper hand pacement and forward rocking momentum needed due to body habitus.  Pt is use to his LIFT CHAIR.               Ambulation/Gait Ambulation/Gait assistance: Supervision, Contact guard assist Gait Distance (Feet): 2 Feet Assistive device: Rolling walker (2 wheels) Gait Pattern/deviations: Step-to pattern, Decreased step length - right, Decreased step length - left, Shuffle, Trunk flexed Gait velocity: decreased     General Gait Details: assisted with amb from recliner to wheelchair approx 2 feet no KI.  Instructed to wear KI for stairs.         Tilt Bed    Modified Rankin (Stroke Patients Only)       Balance                                            Communication Communication Communication: No apparent difficulties Factors Affecting Communication: Hearing impaired  Cognition Arousal: Alert Behavior During Therapy: WFL for tasks assessed/performed   PT - Cognitive impairments: No apparent impairments                       PT - Cognition Comments: AxO x 3 motivated.  Slightly HOH and required repeat Instructions.  Lives home with Spouse but plans to D/C to daughter's house initially.  Had other knee replaced a year ago. Following commands: Intact      Cueing Cueing Techniques: Verbal cues  Exercises      General Comments        Pertinent Vitals/Pain Pain Assessment Pain Assessment: 0-10 Pain Score: 6  Pain  Location: R knee Pain Descriptors / Indicators: Aching, Sore, Grimacing Pain Intervention(s): Monitored during session, Premedicated before session, Repositioned, Ice applied    Home Living                          Prior Function            PT Goals (current goals can now be found in the care plan section) Progress towards PT goals: Progressing toward goals    Frequency    7X/week      PT Plan      Co-evaluation              AM-PAC PT 6 Clicks Mobility   Outcome Measure  Help needed turning from your back to your side while in a flat bed without using bedrails?: A Little Help needed moving from  lying on your back to sitting on the side of a flat bed without using bedrails?: A Little Help needed moving to and from a bed to a chair (including a wheelchair)?: A Little Help needed standing up from a chair using your arms (e.g., wheelchair or bedside chair)?: A Little Help needed to walk in hospital room?: A Little Help needed climbing 3-5 steps with a railing? : A Little 6 Click Score: 18    End of Session Equipment Utilized During Treatment: Gait belt Activity Tolerance: Patient tolerated treatment well Patient left: in wheelchair ;with call bell/phone within reach;with family/visitor present Nurse Communication: Mobility status PT Visit Diagnosis: Difficulty in walking, not elsewhere classified (R26.2);Muscle weakness (generalized) (M62.81);Pain Pain - Right/Left: Right Pain - part of body: Knee     Time: 1420-1435 PT Time Calculation (min) (ACUTE ONLY): 15 min  Charges:    1 ta  Katheryn Leap  PTA Acute  Rehabilitation Services Office M-F          [phone]

## 2024-01-26 ENCOUNTER — Ambulatory Visit: Admitting: Rehabilitative and Restorative Service Providers"

## 2024-01-26 ENCOUNTER — Encounter: Payer: Self-pay | Admitting: Orthopaedic Surgery

## 2024-01-26 ENCOUNTER — Ambulatory Visit: Admitting: Orthopaedic Surgery

## 2024-01-26 ENCOUNTER — Encounter: Payer: Self-pay | Admitting: Rehabilitative and Restorative Service Providers"

## 2024-01-26 DIAGNOSIS — M25661 Stiffness of right knee, not elsewhere classified: Secondary | ICD-10-CM

## 2024-01-26 DIAGNOSIS — R600 Localized edema: Secondary | ICD-10-CM | POA: Diagnosis not present

## 2024-01-26 DIAGNOSIS — M6281 Muscle weakness (generalized): Secondary | ICD-10-CM | POA: Diagnosis not present

## 2024-01-26 DIAGNOSIS — R262 Difficulty in walking, not elsewhere classified: Secondary | ICD-10-CM | POA: Diagnosis not present

## 2024-01-26 DIAGNOSIS — M25561 Pain in right knee: Secondary | ICD-10-CM

## 2024-01-26 DIAGNOSIS — Z96651 Presence of right artificial knee joint: Secondary | ICD-10-CM

## 2024-01-26 MED ORDER — OXYCODONE HCL 5 MG PO TABS: 5.0000 mg | ORAL_TABLET | Freq: Four times a day (QID) | ORAL | 0 refills | Status: AC | PRN

## 2024-01-26 MED ORDER — TIZANIDINE HCL 4 MG PO TABS: 4.0000 mg | ORAL_TABLET | Freq: Four times a day (QID) | ORAL | 0 refills | Status: AC | PRN

## 2024-01-26 NOTE — Therapy (Signed)
 " OUTPATIENT PHYSICAL THERAPY LOWER EXTREMITY EVALUATION   Patient Name: Keith Watkins MRN: [id number] DOB:1945/05/01, 79 y.o., male Today's Date: 01/26/2024  END OF SESSION:  PT End of Session - 01/26/24 1423     Visit Number 1    Number of Visits 24    Date for Recertification  03/22/24    Authorization Type [employer]    Authorization Time Period Through 03/22/2024    Authorization - Visit Number 1    Authorization - Number of Visits 24    Progress Note Due on Visit 10    PT Start Time 1413    PT Stop Time 1459    PT Time Calculation (min) 46 min    Activity Tolerance Patient tolerated treatment well;No increased pain;Patient limited by pain    Behavior During Therapy [facility] for tasks assessed/performed          Past Medical History:  Diagnosis Date   Arthritis    Back pain    Cataract    Cellulitis 07/31/2011   Complication of anesthesia    Pt wakes up too quickly   Enlarged prostate    Foot abscess, left 08/07/2011   Hypertension    prior to gastric bypass- no meds taken   Lumbar spinal stenosis    Obesity 08/09/2011   Past Surgical History:  Procedure Laterality Date   APPENDECTOMY  1963   CHOLECYSTECTOMY  1984   GASTRIC FUNDOPLICATION  1982   at [facility]   I & D EXTREMITY  08/07/2011   Procedure: IRRIGATION AND DEBRIDEMENT EXTREMITY;  Surgeon: Norleen Armor, MD;  Location: [facility];  Service: Orthopedics;  Laterality: Left;  irrigation and debridement left foot abscess   JOINT REPLACEMENT  02-03-23   KNEE ARTHROSCOPY Right    TOTAL KNEE ARTHROPLASTY Left 02/01/2023   Procedure: LEFT TOTAL KNEE ARTHROPLASTY;  Surgeon: [name], MD;  Location: [facility] OR;  Service: Orthopedics;  Laterality: Left;   TOTAL KNEE ARTHROPLASTY Right 01/13/2024   Procedure: ARTHROPLASTY, KNEE, TOTAL;  Surgeon: [name], MD;  Location: [facility];  Service: Orthopedics;  Laterality: Right;   Patient Active Problem List   Diagnosis Date Noted   Status  post total right knee replacement 01/13/2024   Unilateral primary osteoarthritis, right knee 10/05/2023   Status post total left knee replacement 02/01/2023   Bilateral lower extremity edema 06/21/2018   Cellulitis of left lower leg 08/21/2016   Eosinophilia 03/18/2014   Lumbar spinal stenosis    Benign prostatic hyperplasia (BPH) with urinary urge incontinence 08/09/2011    PCP: Butler ONEIDA Burr, MD  REFERRING PROVIDER: [name], MD  REFERRING DIAG:  M17.11 (ICD-10-CM) - Unilateral primary osteoarthritis, right knee    THERAPY DIAG:  Difficulty in walking, not elsewhere classified - Plan: PT plan of care cert/re-cert  Muscle weakness (generalized) - Plan: PT plan of care cert/re-cert  Localized edema - Plan: PT plan of care cert/re-cert  Stiffness of right knee, not elsewhere classified - Plan: PT plan of care cert/re-cert  Acute pain of right knee - Plan: PT plan of care cert/re-cert  Rationale for Evaluation and Treatment: Rehabilitation  ONSET DATE: 01/13/2024  SUBJECTIVE:   SUBJECTIVE STATEMENT: Keith Watkins notes he has had more pain with the right knee/calf as compared to his previous left TKA last January.  He is getting between 1 and 3 hours of sleep before being awakened by right knee pain.  PERTINENT HISTORY: OA, HTN, lumbar spinal stenosis, previous Lt TKA 02/01/2023  PAIN:  Are you having pain? Yes: NPRS scale: 3-5/10 this week  Pain location: Rt knee Pain description: Ache, stiff Aggravating factors: End-range flexion, sleeps 1-3 hours at a time Relieving factors: Muscle relaxer before bed  PRECAUTIONS: Other: Spinal stenosis limits prolonged standing and walking  RED FLAGS: None   WEIGHT BEARING RESTRICTIONS: No  FALLS:  Has patient fallen in last 6 months? No  LIVING ENVIRONMENT: Lives with: lives with their family and wife, son-in-law, daughter Lives in: House/apartment Stairs: 4 steps to get into house, uses walker with supervision Has  following equipment at home: Environmental Consultant - 2 wheeled  OCCUPATION: Retired  PLOF: Independent with basic ADLs  PATIENT GOALS: Get mobility back and get back to doing things without the walker  NEXT MD VISIT: 02/22/2024 at 9:30 AM  OBJECTIVE:  Note: Objective measures were completed at Evaluation unless otherwise noted.  DIAGNOSTIC FINDINGS: Right knee arthroplasty without immediate postoperative complication.   PATIENT SURVEYS:  PSFS: THE PATIENT SPECIFIC FUNCTIONAL SCALE  Place score of 0-10 (0 = unable to perform activity and 10 = able to perform activity at the same level as before injury or problem)  Activity Date: 01/26/2024    Stairs 3    2.  Balance 3    3.  Walk independently 2    4.  Prolonged standing 3    Total Score 2.75      Total Score = Sum of activity scores/number of activities  Minimally Detectable Change: 3 points (for single activity); 2 points (for average score)  Keith Watkins (nd). The Patient Specific Functional Scale . Retrieved from [email or web address].[email or web address]   COGNITION: Overall cognitive status: Within functional limits for tasks assessed     SENSATION: Keith Watkins has no new complaints of peripheral pain or paresthesias  EDEMA:  Noted and not objectively assessed   LOWER EXTREMITY ROM:  Active ROM Left/Right 01/26/2024   Hip flexion    Hip extension    Hip abduction    Hip adduction    Hip internal rotation    Hip external rotation    Knee flexion 124/70   Knee extension 0/+3   Ankle dorsiflexion    Ankle plantarflexion    Ankle inversion    Ankle eversion     (Blank rows = not tested)  LOWER EXTREMITY STRENGTH:  Assessed in pounds with a hand-held dynamometer at 70 degrees knee flexion sitting Left/Right 01/26/2024   Hip flexion    Hip extension    Hip abduction    Hip adduction    Hip internal rotation    Hip external rotation    Knee flexion    Knee extension 59.9/20.9   Ankle  dorsiflexion    Ankle plantarflexion    Ankle inversion    Ankle eversion     (Blank rows = not tested)  GAIT: Distance walked: 50 feet Assistive device utilized: Walker - 2 wheeled Level of assistance: Modified independence Comments: Keith Watkins notes he has been going up stairs backwards with his walker  TREATMENT DATE: 01/26/2024  Quadriceps sets with a pillow under both knees 10 x 5 seconds (only using the pillow because comfort and an adequate quadriceps contraction is more important than extension active range of motion at this point secondary to extension active range of motion being most limited by edema and only 3 degrees from neutral) Tailgate knee flexion 1 minute Seated active assisted knee flexion 5 x 10 seconds with a TKE between reps  Manual:  Patellar mobilizations 5 minutes  [id number]: Discussed the early emphasis on flexion active range of motion, edema control and quadriceps strength.  Mentioned that we will keep an eye on extension active range of motion, although this is expected to improve as edema decreases.  Reviewed day 1 exam findings and home exercise program.   PATIENT EDUCATION:  Education details: See above Person educated: Patient Education method: Explanation, Demonstration, Tactile cues, Verbal cues, and Handouts Education comprehension: verbalized understanding, returned demonstration, verbal cues required, tactile cues required, and needs further education  HOME EXERCISE PROGRAM: Access Code: [email or web address] URL: [email or web address] Date: 01/26/2024 Prepared by: Lamar Ivory  Exercises - Supine Quadricep Sets  - 10 x daily - 7 x weekly - 1 sets - 10 reps - 5 second hold - Seated Knee Flexion AAROM  - 10 x daily - 7 x weekly - 1 sets - 5 reps - 10 seconds hold - Seated Long Arc Quad  - 10 x daily - 7 x weekly - 1 sets - 5 reps - 2  seconds hold  ASSESSMENT:  CLINICAL IMPRESSION: Patient is a 79 y.o. male who was seen today for physical therapy evaluation and treatment for  M17.11 (ICD-10-CM) - Unilateral primary osteoarthritis, right knee  Keith Watkins is familiar with the post total knee replacement rehabilitation process as he had his left knee replaced in January of last year.  He does note that he has had more difficulty with his right knee as compared to his left.  Extension active range of motion only lacks 3 degrees from neutral and is most limited by edema, whereas flexion active range of motion is very limited at 70 degrees.  Emphasis today was on flexion active range of motion, edema control and quadriceps strength.  It is a good rehabilitation candidate and should meet long-term goals listed below.  OBJECTIVE IMPAIRMENTS: Abnormal gait, decreased activity tolerance, decreased endurance, difficulty walking, decreased ROM, decreased strength, increased edema, impaired perceived functional ability, obesity, and pain.   ACTIVITY LIMITATIONS: bending, standing, squatting, sleeping, stairs, transfers, bed mobility, dressing, and locomotion level  PARTICIPATION LIMITATIONS: interpersonal relationship, driving, shopping, and community activity  PERSONAL FACTORS: OA, HTN, lumbar spinal stenosis, previous Lt TKA 02/01/2023 are also affecting patient's functional outcome.   REHAB POTENTIAL: Good  CLINICAL DECISION MAKING: Evolving/moderate complexity  EVALUATION COMPLEXITY: Moderate   GOALS: Goals reviewed with patient? Yes  SHORT TERM GOALS: Target date: 02/23/2024 Keith Watkins will be independent with his day 1 home exercise program Baseline: Started 01/26/2024 Goal status: INITIAL  2.  Improve right knee active range of motion to 0 - 2 -90 degrees Baseline: 0 - 3 - 70 degrees Goal status: INITIAL  3.  Improve right quadriceps strength to at least 30 pounds Baseline: 20.9 pounds Goal status: INITIAL  4.  Keith Watkins will be  comfortable with a single-point cane outside the home and will only rely on his 2 wheeled walker for long distances Baseline: 2 wheeled walker most of the time Goal status: INITIAL  LONG TERM GOALS: Target date: 03/22/2024  Improved  patient-specific functional scale to 5.75 or better Baseline: 2.75 Goal status: INITIAL  2.  And will report right knee pain consistently 0-3/10 on the numeric pain rating scale Baseline: Pretty much stays in the 3-5 out of 10 range, increased pain reported with endrange flexion Goal status: INITIAL  3.  Improve right knee flexion active range of motion to 110 degrees or better (extension should improve as edema decreases over time) Baseline: 70 degrees Goal status: INITIAL  4.  Improve objective right quadriceps strength to at least 50 pounds (80+ pounds recommended 9 to 12 months post-surgery) Baseline: 20.9 pounds Goal status: INITIAL  5.  Keith Watkins will be independent without an assistive device Baseline: 2 wheeled walker most of the time Goal status: INITIAL  6.  Keith Watkins will be independent with his long-term home exercise program at discharge Baseline: Started 01/26/2024 Goal status: INITIAL   PLAN:  PT FREQUENCY: 2-3 times a week  PT DURATION: 8 weeks  PLANNED INTERVENTIONS: 97750- Physical Performance Testing, 97110-Therapeutic exercises, 97530- Therapeutic activity, 97112- Neuromuscular re-education, [phone]- Self Care, 02859- Manual therapy, [phone]- Gait training, [phone]- Electrical stimulation (unattended), 97016- Vasopneumatic device, Patient/Family education, Balance training, Stair training, Joint mobilization, and Cryotherapy  PLAN FOR NEXT SESSION: Emphasis on knee flexion active range of motion, quadriceps strength and edema control   Myer LELON Ivory, PT, MPT 01/26/2024, 3:15 PM  "

## 2024-01-26 NOTE — Progress Notes (Signed)
 Keith Watkins is here for his first postoperative visit status post a right total knee replacement to treat significant right knee pain and arthritis.  We replaced his left knee exactly a year ago.  That knee has done well.  He is an active 79 year old gentleman.  He stated that he was having some significant calf pain early on but that is gone.  He has been compliant with a baby aspirin  twice daily.  He actually starts physical therapy upstairs this afternoon later.  He said he is very constipated and cannot sleep well at night.  On exam his right knee incision looks good.  Surprising there is not a lot of swelling compared to other patients but there is still swelling.  Staples been removed and Steri-Strips applied.  His calf is entirely soft and his Toula' sign is negative.  Has good flexion extension of the foot and ankle.  He flexes to about 70 degrees or more of the knee itself on the right side.  We talked about different medications to help with constipation.  He is not taking a lot of his narcotics and have encouraged him to still take the narcotics to help from a sleeping standpoint and pain standpoint overall and again there are over-the-counter medications such as mag citrate that he can take for constipation.  I did send in a refill of oxycodone  and tizanidine .  We will see him back in a month to see how he is doing overall from a range of motion standpoint but no x-rays are needed.

## 2024-01-29 ENCOUNTER — Other Ambulatory Visit: Payer: Self-pay | Admitting: Family Medicine

## 2024-01-30 NOTE — Telephone Encounter (Signed)
 Requested Prescriptions  Pending Prescriptions Disp Refills   oxybutynin  (DITROPAN -XL) 10 MG 24 hr tablet [Pharmacy Med Name: OXYBUTYNIN  CL ER 10 MG TABLET] 180 tablet 0    Sig: TAKE 1 TABLET (10 MG TOTAL) BY MOUTH IN THE MORNING AND AT BEDTIME     Urology:  Bladder Agents Passed - 01/30/2024  2:45 PM      Passed - Valid encounter within last 12 months    Recent Outpatient Visits           3 months ago Colon cancer screening   [place] [facility] Duanne Butler DASEN, MD   1 year ago General medical exam   [place] [facility] Duanne Butler DASEN, MD   2 years ago Joint laxity of left knee   [place] [facility] Duanne Butler DASEN, MD   2 years ago Morbid obesity with BMI of 45.0-49.9, adult [facility])   [place] [facility] Duanne Butler DASEN, MD   2 years ago Morbid obesity with BMI of 45.0-49.9, adult [facility])   [place] [facility] [place], Butler DASEN, MD

## 2024-01-31 ENCOUNTER — Ambulatory Visit

## 2024-01-31 DIAGNOSIS — M25661 Stiffness of right knee, not elsewhere classified: Secondary | ICD-10-CM | POA: Diagnosis not present

## 2024-01-31 DIAGNOSIS — R262 Difficulty in walking, not elsewhere classified: Secondary | ICD-10-CM

## 2024-01-31 DIAGNOSIS — M25561 Pain in right knee: Secondary | ICD-10-CM

## 2024-01-31 DIAGNOSIS — R600 Localized edema: Secondary | ICD-10-CM

## 2024-01-31 DIAGNOSIS — M6281 Muscle weakness (generalized): Secondary | ICD-10-CM

## 2024-01-31 NOTE — Therapy (Signed)
 " OUTPATIENT PHYSICAL THERAPY LOWER EXTREMITY TREATMENT   Patient Name: Keith Watkins MRN: [id number] DOB:1945-07-31, 79 y.o., male Today's Date: 01/31/2024  END OF SESSION:  PT End of Session - 01/31/24 1349     Visit Number 2    Number of Visits 24    Date for Recertification  03/22/24    Authorization Type [employer]    Authorization Time Period Through 03/22/2024    PT Start Time 1300    PT Stop Time 1348    PT Time Calculation (min) 48 min    Activity Tolerance Patient tolerated treatment well    Behavior During Therapy [facility] for tasks assessed/performed           Past Medical History:  Diagnosis Date   Arthritis    Back pain    Cataract    Cellulitis 07/31/2011   Complication of anesthesia    Pt wakes up too quickly   Enlarged prostate    Foot abscess, left 08/07/2011   Hypertension    prior to gastric bypass- no meds taken   Lumbar spinal stenosis    Obesity 08/09/2011   Past Surgical History:  Procedure Laterality Date   APPENDECTOMY  1963   CHOLECYSTECTOMY  1984   GASTRIC FUNDOPLICATION  1982   at [facility]   I & D EXTREMITY  08/07/2011   Procedure: IRRIGATION AND DEBRIDEMENT EXTREMITY;  Surgeon: Norleen Armor, MD;  Location: [facility];  Service: Orthopedics;  Laterality: Left;  irrigation and debridement left foot abscess   JOINT REPLACEMENT  02-03-23   KNEE ARTHROSCOPY Right    TOTAL KNEE ARTHROPLASTY Left 02/01/2023   Procedure: LEFT TOTAL KNEE ARTHROPLASTY;  Surgeon: [name], MD;  Location: [facility] OR;  Service: Orthopedics;  Laterality: Left;   TOTAL KNEE ARTHROPLASTY Right 01/13/2024   Procedure: ARTHROPLASTY, KNEE, TOTAL;  Surgeon: [name], MD;  Location: [facility];  Service: Orthopedics;  Laterality: Right;   Patient Active Problem List   Diagnosis Date Noted   Status post total right knee replacement 01/13/2024   Unilateral primary osteoarthritis, right knee 10/05/2023   Status post total left knee replacement  02/01/2023   Bilateral lower extremity edema 06/21/2018   Cellulitis of left lower leg 08/21/2016   Eosinophilia 03/18/2014   Lumbar spinal stenosis    Benign prostatic hyperplasia (BPH) with urinary urge incontinence 08/09/2011    PCP: Butler ONEIDA Burr, MD  REFERRING PROVIDER: [name], MD  REFERRING DIAG:  M17.11 (ICD-10-CM) - Unilateral primary osteoarthritis, right knee    THERAPY DIAG:  Stiffness of right knee, not elsewhere classified  Difficulty in walking, not elsewhere classified  Muscle weakness (generalized)  Acute pain of right knee  Localized edema  Rationale for Evaluation and Treatment: Rehabilitation  ONSET DATE: 01/13/2024  SUBJECTIVE:   SUBJECTIVE STATEMENT: Ed states he still is having difficulty sleeping.  PERTINENT HISTORY: OA, HTN, lumbar spinal stenosis, previous Lt TKA 02/01/2023   PAIN:  Are you having pain? Yes: NPRS scale: 3-5/10   Pain location: Rt knee Pain description: Ache, stiff Aggravating factors: End-range flexion, sleeps 1-3 hours at a time Relieving factors: Muscle relaxer before bed  PRECAUTIONS: Other: Spinal stenosis limits prolonged standing and walking  RED FLAGS: None   WEIGHT BEARING RESTRICTIONS: No  FALLS:  Has patient fallen in last 6 months? No  LIVING ENVIRONMENT: Lives with: lives with their family and wife, son-in-law, daughter Lives in: House/apartment Stairs: 4 steps to get into house, uses walker with supervision  Has following equipment at home: Vannie - 2 wheeled  OCCUPATION: Retired  PLOF: Independent with basic ADLs  PATIENT GOALS: Get mobility back and get back to doing things without the walker  NEXT MD VISIT: 02/22/2024 at 9:30 AM  OBJECTIVE:  Note: Objective measures were completed at Evaluation unless otherwise noted.  DIAGNOSTIC FINDINGS: Right knee arthroplasty without immediate postoperative complication.   PATIENT SURVEYS:  PSFS: THE PATIENT SPECIFIC FUNCTIONAL  SCALE  Place score of 0-10 (0 = unable to perform activity and 10 = able to perform activity at the same level as before injury or problem)  Activity Date: 01/26/2024    Stairs 3    2.  Balance 3    3.  Walk independently 2    4.  Prolonged standing 3    Total Score 2.75      Total Score = Sum of activity scores/number of activities  Minimally Detectable Change: 3 points (for single activity); 2 points (for average score)  [employer] [facility] (nd). The Patient Specific Functional Scale . Retrieved from Skateoasis.com.pt   COGNITION: Overall cognitive status: Within functional limits for tasks assessed     SENSATION: Ed has no new complaints of peripheral pain or paresthesias  EDEMA:  Noted and not objectively assessed   LOWER EXTREMITY ROM:  Active ROM Left/Right 01/26/2024 Right 01/31/24  Hip flexion    Hip extension    Hip abduction    Hip adduction    Hip internal rotation    Hip external rotation    Knee flexion 124/70 80A  Knee extension 0/+3   Ankle dorsiflexion    Ankle plantarflexion    Ankle inversion    Ankle eversion     (Blank rows = not tested)  LOWER EXTREMITY STRENGTH:  Assessed in pounds with a hand-held dynamometer at 70 degrees knee flexion sitting Left/Right 01/26/2024   Hip flexion    Hip extension    Hip abduction    Hip adduction    Hip internal rotation    Hip external rotation    Knee flexion    Knee extension 59.9/20.9   Ankle dorsiflexion    Ankle plantarflexion    Ankle inversion    Ankle eversion     (Blank rows = not tested)  GAIT: Distance walked: 50 feet Assistive device utilized: [employer] - 2 wheeled Level of assistance: Modified independence Comments: Ed notes he has been going up stairs backwards with his walker                                                                                                                       TREATMENT DATE:  R  TKA 01/31/24 Scifit seat 13 5 min half circles Mini squats 2x10 Standing heel raises 2x10 LAQ 2x10 Supine quad sets 2x10 Supine heel slides 2x10 Seated marching to highlight hip flexion 2x10 Manual PT for flexion and gastroc stretching  Vaso 34 degrees med compression 5 min   01/26/2024  Quadriceps sets with a  pillow under both knees 10 x 5 seconds (only using the pillow because comfort and an adequate quadriceps contraction is more important than extension active range of motion at this point secondary to extension active range of motion being most limited by edema and only 3 degrees from neutral) Tailgate knee flexion 1 minute Seated active assisted knee flexion 5 x 10 seconds with a TKE between reps  Manual:  Patellar mobilizations 5 minutes  [id number]: Discussed the early emphasis on flexion active range of motion, edema control and quadriceps strength.  Mentioned that we will keep an eye on extension active range of motion, although this is expected to improve as edema decreases.  Reviewed day 1 exam findings and home exercise program.   PATIENT EDUCATION:  Education details: See above Person educated: Patient Education method: Explanation, Demonstration, Tactile cues, Verbal cues, and Handouts Education comprehension: verbalized understanding, returned demonstration, verbal cues required, tactile cues required, and needs further education  HOME EXERCISE PROGRAM: Access Code: [email or web address] URL: [email or web address] Date: 01/26/2024 Prepared by: Lamar Ivory  Exercises - Supine Quadricep Sets  - 10 x daily - 7 x weekly - 1 sets - 10 reps - 5 second hold - Seated Knee Flexion AAROM  - 10 x daily - 7 x weekly - 1 sets - 5 reps - 10 seconds hold - Seated Long Arc Quad  - 10 x daily - 7 x weekly - 1 sets - 5 reps - 2 seconds hold  ASSESSMENT:  CLINICAL IMPRESSION: Patient demonstrates increased AROM with flexion as evidenced by measurement. OBJECTIVE IMPAIRMENTS:  Abnormal gait, decreased activity tolerance, decreased endurance, difficulty walking, decreased ROM, decreased strength, increased edema, impaired perceived functional ability, obesity, and pain.   ACTIVITY LIMITATIONS: bending, standing, squatting, sleeping, stairs, transfers, bed mobility, dressing, and locomotion level  PARTICIPATION LIMITATIONS: interpersonal relationship, driving, shopping, and community activity  PERSONAL FACTORS: OA, HTN, lumbar spinal stenosis, previous Lt TKA 02/01/2023 are also affecting patient's functional outcome.   REHAB POTENTIAL: Good  CLINICAL DECISION MAKING: Evolving/moderate complexity  EVALUATION COMPLEXITY: Moderate   GOALS: Goals reviewed with patient? Yes  SHORT TERM GOALS: Target date: 02/23/2024 Ed will be independent with his day 1 home exercise program Baseline: Started 01/26/2024 Goal status: INITIAL  2.  Improve right knee active range of motion to 0 - 2 -90 degrees Baseline: 0 - 3 - 70 degrees Goal status: INITIAL  3.  Improve right quadriceps strength to at least 30 pounds Baseline: 20.9 pounds Goal status: INITIAL  4.  Ed will be comfortable with a single-point cane outside the home and will only rely on his 2 wheeled walker for long distances Baseline: 2 wheeled walker most of the time Goal status: INITIAL  LONG TERM GOALS: Target date: 03/22/2024  Improved patient-specific functional scale to 5.75 or better Baseline: 2.75 Goal status: INITIAL  2.  And will report right knee pain consistently 0-3/10 on the numeric pain rating scale Baseline: Pretty much stays in the 3-5 out of 10 range, increased pain reported with endrange flexion Goal status: INITIAL  3.  Improve right knee flexion active range of motion to 110 degrees or better (extension should improve as edema decreases over time) Baseline: 70 degrees Goal status: INITIAL  4.  Improve objective right quadriceps strength to at least 50 pounds (80+ pounds recommended 9  to 12 months post-surgery) Baseline: 20.9 pounds Goal status: INITIAL  5.  Ed will be independent without an assistive device Baseline: 2 wheeled walker most of the time Goal status:  INITIAL  6.  Ed will be independent with his long-term home exercise program at discharge Baseline: Started 01/26/2024 Goal status: INITIAL   PLAN:  PT FREQUENCY: 2-3 times a week  PT DURATION: 8 weeks  PLANNED INTERVENTIONS: 97750- Physical Performance Testing, 97110-Therapeutic exercises, 97530- Therapeutic activity, 97112- Neuromuscular re-education, 97535- Self Care, [id number]- Manual therapy, [phone]- Gait training, [phone]- Electrical stimulation (unattended), [id number]- Vasopneumatic device, Patient/Family education, Balance training, Stair training, Joint mobilization, and Cryotherapy  PLAN FOR NEXT SESSION: Emphasis on knee flexion active range of motion, quadriceps strength and edema control   Burnard CHRISTELLA Meth, PT,  01/31/2024, 1:50 PM  "

## 2024-02-01 NOTE — Therapy (Incomplete)
 " OUTPATIENT PHYSICAL THERAPY LOWER EXTREMITY TREATMENT   Patient Name: Keith Watkins MRN: [id number] DOB:1945/11/10, 79 y.o., male Today's Date: 02/02/2024  END OF SESSION:  PT End of Session - 02/02/24 0943     Visit Number 3    Number of Visits 24    Date for Recertification  03/22/24    Authorization Type [employer]    Authorization Time Period Through 03/22/2024    Authorization - Visit Number 3    Authorization - Number of Visits 24    Progress Note Due on Visit 10    PT Start Time 0945    PT Stop Time 1042    PT Time Calculation (min) 57 min    Equipment Utilized During Treatment Gait belt    Activity Tolerance Patient tolerated treatment well    Behavior During Therapy WFL for tasks assessed/performed            Past Medical History:  Diagnosis Date   Arthritis    Back pain    Cataract    Cellulitis 07/31/2011   Complication of anesthesia    Pt wakes up too quickly   Enlarged prostate    Foot abscess, left 08/07/2011   Hypertension    prior to gastric bypass- no meds taken   Lumbar spinal stenosis    Obesity 08/09/2011   Past Surgical History:  Procedure Laterality Date   APPENDECTOMY  1963   CHOLECYSTECTOMY  1984   GASTRIC FUNDOPLICATION  1982   at [facility]   I & D EXTREMITY  08/07/2011   Procedure: IRRIGATION AND DEBRIDEMENT EXTREMITY;  Surgeon: Norleen Armor, MD;  Location: [facility];  Service: Orthopedics;  Laterality: Left;  irrigation and debridement left foot abscess   JOINT REPLACEMENT  02-03-23   KNEE ARTHROSCOPY Right    TOTAL KNEE ARTHROPLASTY Left 02/01/2023   Procedure: LEFT TOTAL KNEE ARTHROPLASTY;  Surgeon: [name], MD;  Location: [facility];  Service: Orthopedics;  Laterality: Left;   TOTAL KNEE ARTHROPLASTY Right 01/13/2024   Procedure: ARTHROPLASTY, KNEE, TOTAL;  Surgeon: [name], MD;  Location: [facility];  Service: Orthopedics;  Laterality: Right;   Patient Active Problem List   Diagnosis Date Noted    Status post total right knee replacement 01/13/2024   Unilateral primary osteoarthritis, right knee 10/05/2023   Status post total left knee replacement 02/01/2023   Bilateral lower extremity edema 06/21/2018   Cellulitis of left lower leg 08/21/2016   Eosinophilia 03/18/2014   Lumbar spinal stenosis    Benign prostatic hyperplasia (BPH) with urinary urge incontinence 08/09/2011    PCP: Butler ONEIDA Burr, MD  REFERRING PROVIDER: [name], MD  REFERRING DIAG:  M17.11 (ICD-10-CM) - Unilateral primary osteoarthritis, right knee    THERAPY DIAG:  Stiffness of right knee, not elsewhere classified  Difficulty in walking, not elsewhere classified  Acute pain of right knee  Muscle weakness (generalized)  Localized edema  Rationale for Evaluation and Treatment: Rehabilitation  ONSET DATE: 01/13/2024  SUBJECTIVE:   SUBJECTIVE STATEMENT:  Patient states that he is doing well. Sleep still feels the same. Has noted some increased stiffness after PT session with mini squats and heel raises. Using mini trainer bike at home.   PERTINENT HISTORY: OA, HTN, lumbar spinal stenosis, previous Lt TKA 02/01/2023   PAIN:  Are you having pain? Yes: NPRS scale: At rest/lowest is a 1/0 and with increased activity/highest 4-5/10  on bike 7-8/10 Pain location: Rt knee Pain description: Ache, stiff Aggravating  factors: End-range flexion, sleeps 1-3 hours at a time Relieving factors: Muscle relaxer before bed  PRECAUTIONS: Other: Spinal stenosis limits prolonged standing and walking  RED FLAGS: None   WEIGHT BEARING RESTRICTIONS: No  FALLS:  Has patient fallen in last 6 months? No  LIVING ENVIRONMENT: Lives with: lives with their family and wife, son-in-law, daughter Lives in: House/apartment Stairs: 4 steps to get into house, uses walker with supervision Has following equipment at home: Environmental Consultant - 2 wheeled  OCCUPATION: Retired  PLOF: Independent with basic ADLs  PATIENT  GOALS: Get mobility back and get back to doing things without the walker  NEXT MD VISIT: 02/22/2024 at 9:30 AM  OBJECTIVE:  Note: Objective measures were completed at Evaluation unless otherwise noted.  DIAGNOSTIC FINDINGS: Right knee arthroplasty without immediate postoperative complication.   PATIENT SURVEYS:  PSFS: THE PATIENT SPECIFIC FUNCTIONAL SCALE  Place score of 0-10 (0 = unable to perform activity and 10 = able to perform activity at the same level as before injury or problem)  Activity Date:  01/26/24    Stairs 3    2.  Balance 3    3.  Walk independently 2    4.  Prolonged standing 3    Total Score 2.75      Total Score = Sum of activity scores/number of activities  Minimally Detectable Change: 3 points (for single activity); 2 points (for average score) [facility] (nd). The Patient Specific Functional Scale . Retrieved from [email or web address]   COGNITION: Overall cognitive status: Within functional limits for tasks assessed     SENSATION: Ed has no new complaints of peripheral pain or paresthesias  EDEMA:  Noted and not objectively assessed  LOWER EXTREMITY ROM:  Active ROM Left/Right  01/26/2024 Right  01/31/24  Hip flexion    Hip extension    Hip abduction    Hip adduction    Hip internal rotation    Hip external rotation    Knee flexion 124/70 80A  Knee extension 0/+3   Ankle dorsiflexion    Ankle plantarflexion    Ankle inversion    Ankle eversion     (Blank rows = not tested)  LOWER EXTREMITY STRENGTH:  Assessed in pounds with a hand-held dynamometer at 70 degrees knee flexion sitting Left/Right 01/26/2024   Hip flexion    Hip extension    Hip abduction    Hip adduction    Hip internal rotation    Hip external rotation    Knee flexion    Knee extension 59.9/20.9   Ankle dorsiflexion    Ankle plantarflexion    Ankle inversion    Ankle eversion     (Blank rows = not  tested)  GAIT: Distance walked: 50 feet Assistive device utilized: Walker - 2 wheeled Level of assistance: Modified independence Comments: Ed notes he has been going up stairs backwards with his walker                                                                                                TODAY'S TREATMENT  R TKA  02/02/2024 TherEx Recumbent  bike seat 10; 8 min half revolutions with hold for increased stretch Alternating LAQ and active knee flexion with contralateral LE opposing motion to facilitate better muscle contraction; 2# BLEs ankle weights 2 sets 10 reps Supine SLR 2 sets 20 reps. PT verbal cue to pull toes towards body for increased quad activation.  TherAct Standing alternating taps on 6 inch hurdle in parallel bar with BUE support 1 set 10 reps ; then 10 reps progress to step over hurdle and 5 sec stagger stance hold for balance step back to starting positing. PT verbal cue to decrease abduction in Rt leg and focus on increase hip and knee flexion range to clear hurdle. Double leg press 75#; 1 set 15 reps Single leg press 25#; 1 set 10 reps  Sit <>Stand from 24 tall stool in parallel bars without use of UE support 1 set 10 reps   Pt amb 52 ft with CGA and SPC-stand alone tip for transition from RW for household ambulation. Pt required verbal cue for cane placement and patterning.    Selfcare Patient education about modified standing using tall stool in parallel bars during home activities to decrease load on knee. He was able to have pelvis on seat & feet on floor for Modified stand using table at 29 inch height to build standing tolerance. Patient demonstrated and verbalized understanding.  Modalities Vaso; knee elevation med compression 34* 10 mins   TREATMENT DATE:  R TKA 01/31/24 Scifit seat 13 5 min half circles Mini squats 2x10 Standing heel raises 2x10 LAQ 2x10 Supine quad sets 2x10 Supine heel slides 2x10 Seated marching to highlight hip flexion  2x10 Manual PT for flexion and gastroc stretching  Vaso 34 degrees med compression 5 min  TREATMENT 01/26/2024  Quadriceps sets with a pillow under both knees 10 x 5 seconds (only using the pillow because comfort and an adequate quadriceps contraction is more important than extension active range of motion at this point secondary to extension active range of motion being most limited by edema and only 3 degrees from neutral) Tailgate knee flexion 1 minute Seated active assisted knee flexion 5 x 10 seconds with a TKE between reps  Manual:  Patellar mobilizations 5 minutes  [id number]: Discussed the early emphasis on flexion active range of motion, edema control and quadriceps strength.  Mentioned that we will keep an eye on extension active range of motion, although this is expected to improve as edema decreases.  Reviewed day 1 exam findings and home exercise program.   PATIENT EDUCATION:  Education details: See above Person educated: Patient Education method: Explanation, Demonstration, Tactile cues, Verbal cues, and Handouts Education comprehension: verbalized understanding, returned demonstration, verbal cues required, tactile cues required, and needs further education  HOME EXERCISE PROGRAM: Access Code: [email or web address] URL: [email or web address] Date: 01/26/2024 Prepared by: Lamar Ivory  Exercises - Supine Quadricep Sets  - 10 x daily - 7 x weekly - 1 sets - 10 reps - 5 second hold - Seated Knee Flexion AAROM  - 10 x daily - 7 x weekly - 1 sets - 5 reps - 10 seconds hold - Seated Long Arc Quad  - 10 x daily - 7 x weekly - 1 sets - 5 reps - 2 seconds hold  ASSESSMENT:  CLINICAL IMPRESSION: Patient presented to session with antalgic gait and complaints of stiffness. After balance and strength exercises PT and patient noted decreased antalgic gait. Progressed to using a SPC-stand alone tip for short distances in transition to household ambulation use.  Deficits with Rt knee  flexion and quad strength still present. Patient also struggled to clear Rt leg over hurdle abducting the leg with increased verbal cueing form PT for knee and hip flexion. Patient will continue to benefit from skilled PT services to improve Rt knee strength, balance, and AROM.   OBJECTIVE IMPAIRMENTS: Abnormal gait, decreased activity tolerance, decreased endurance, difficulty walking, decreased ROM, decreased strength, increased edema, impaired perceived functional ability, obesity, and pain.   ACTIVITY LIMITATIONS: bending, standing, squatting, sleeping, stairs, transfers, bed mobility, dressing, and locomotion level  PARTICIPATION LIMITATIONS: interpersonal relationship, driving, shopping, and community activity  PERSONAL FACTORS: OA, HTN, lumbar spinal stenosis, previous Lt TKA 02/01/2023 are also affecting patient's functional outcome.   REHAB POTENTIAL: Good  CLINICAL DECISION MAKING: Evolving/moderate complexity  EVALUATION COMPLEXITY: Moderate   GOALS:  Goals reviewed with patient? Yes  SHORT TERM GOALS: Target date: 02/23/2024 Ed will be independent with his day 1 home exercise program Baseline: Started 01/26/2024 Goal status: Ongoing 02/02/24  2.  Improve right knee active range of motion to 0 - 2 -90 degrees Baseline: 0 - 3 - 70 degrees Goal status: Ongoing 02/02/24  3.  Improve right quadriceps strength to at least 30 pounds Baseline: 20.9 pounds Goal status: Ongoing 02/02/24  4.  Ed will be comfortable with a single-point cane outside the home and will only rely on his 2 wheeled walker for long distances Baseline: 2 wheeled walker most of the time Goal status: Ongoing 02/02/24  LONG TERM GOALS: Target date: 03/22/2024  Improved patient-specific functional scale to 5.75 or better Baseline: 2.75 Goal status: Ongoing 02/02/24  2.  And will report right knee pain consistently 0-3/10 on the numeric pain rating scale Baseline: Pretty much stays in the 3-5 out of 10 range,  increased pain reported with endrange flexion Goal status: Ongoing 02/02/24  3.  Improve right knee flexion active range of motion to 110 degrees or better (extension should improve as edema decreases over time) Baseline: 70 degrees Goal status: Ongoing 02/02/24  4.  Improve objective right quadriceps strength to at least 50 pounds (80+ pounds recommended 9 to 12 months post-surgery) Baseline: 20.9 pounds Goal status: Ongoing 02/02/24  5.  Ed will be independent without an assistive device Baseline: 2 wheeled walker most of the time Goal status: Ongoing 02/02/24  6.  Ed will be independent with his long-term home exercise program at discharge Baseline: Started 01/26/2024 Goal status: Ongoing 02/02/24   PLAN:  PT FREQUENCY: 2-3 times a week  PT DURATION: 8 weeks  PLANNED INTERVENTIONS: 97750- Physical Performance Testing, 97110-Therapeutic exercises, 97530- Therapeutic activity, 97112- Neuromuscular re-education, 97535- Self Care, [id number]- Manual therapy, [phone]- Gait training, [phone]- Electrical stimulation (unattended), [id number]- Vasopneumatic device, Patient/Family education, Balance training, Stair training, Joint mobilization, and Cryotherapy  PLAN FOR NEXT SESSION: Continue knee flexion AROM, balance, and functional strengthening; Increase distance using cane. Emphasis on Rt hip/knee flexion for clearance during step task. Check on rollator walker safety.   Sherline Babe, Student-PT 02/02/2024, 10:58 AM  This entire session of physical therapy was performed under the direct supervision of PT signing evaluation /treatment. PT reviewed note and agrees.   Grayce Spatz, PT, DPT 02/02/2024, 11:21 AM  "

## 2024-02-02 ENCOUNTER — Ambulatory Visit: Admitting: Physical Therapy

## 2024-02-02 ENCOUNTER — Encounter: Payer: Self-pay | Admitting: Physical Therapy

## 2024-02-02 DIAGNOSIS — M6281 Muscle weakness (generalized): Secondary | ICD-10-CM | POA: Diagnosis not present

## 2024-02-02 DIAGNOSIS — M25661 Stiffness of right knee, not elsewhere classified: Secondary | ICD-10-CM | POA: Diagnosis not present

## 2024-02-02 DIAGNOSIS — M25561 Pain in right knee: Secondary | ICD-10-CM | POA: Diagnosis not present

## 2024-02-02 DIAGNOSIS — R262 Difficulty in walking, not elsewhere classified: Secondary | ICD-10-CM | POA: Diagnosis not present

## 2024-02-02 DIAGNOSIS — R600 Localized edema: Secondary | ICD-10-CM

## 2024-02-07 ENCOUNTER — Encounter: Admitting: Physical Therapy

## 2024-02-08 ENCOUNTER — Ambulatory Visit: Admitting: Physical Therapy

## 2024-02-08 ENCOUNTER — Encounter: Payer: Self-pay | Admitting: Physical Therapy

## 2024-02-08 DIAGNOSIS — R600 Localized edema: Secondary | ICD-10-CM | POA: Diagnosis not present

## 2024-02-08 DIAGNOSIS — R262 Difficulty in walking, not elsewhere classified: Secondary | ICD-10-CM | POA: Diagnosis not present

## 2024-02-08 DIAGNOSIS — M25662 Stiffness of left knee, not elsewhere classified: Secondary | ICD-10-CM

## 2024-02-08 DIAGNOSIS — G8929 Other chronic pain: Secondary | ICD-10-CM | POA: Diagnosis not present

## 2024-02-08 DIAGNOSIS — M25562 Pain in left knee: Secondary | ICD-10-CM | POA: Diagnosis not present

## 2024-02-08 DIAGNOSIS — M25561 Pain in right knee: Secondary | ICD-10-CM

## 2024-02-08 DIAGNOSIS — M6281 Muscle weakness (generalized): Secondary | ICD-10-CM

## 2024-02-08 DIAGNOSIS — M25661 Stiffness of right knee, not elsewhere classified: Secondary | ICD-10-CM

## 2024-02-08 NOTE — Therapy (Signed)
 " OUTPATIENT PHYSICAL THERAPY LOWER EXTREMITY TREATMENT   Patient Name: Keith Watkins MRN: [id number] DOB:1945/10/29, 79 y.o., male Today's Date: 02/08/2024  END OF SESSION:  PT End of Session - 02/08/24 0749     Visit Number 4    Number of Visits 24    Date for Recertification  03/22/24    Authorization Type [employer]    Authorization Time Period Through 03/22/2024    Authorization - Visit Number 4    Authorization - Number of Visits 24    Progress Note Due on Visit 10    PT Start Time 0800    PT Stop Time 0840    PT Time Calculation (min) 40 min    Equipment Utilized During Treatment Gait belt    Activity Tolerance Patient tolerated treatment well    Behavior During Therapy WFL for tasks assessed/performed            Past Medical History:  Diagnosis Date   Arthritis    Back pain    Cataract    Cellulitis 07/31/2011   Complication of anesthesia    Pt wakes up too quickly   Enlarged prostate    Foot abscess, left 08/07/2011   Hypertension    prior to gastric bypass- no meds taken   Lumbar spinal stenosis    Obesity 08/09/2011   Past Surgical History:  Procedure Laterality Date   APPENDECTOMY  1963   CHOLECYSTECTOMY  1984   GASTRIC FUNDOPLICATION  1982   at [facility]   I & D EXTREMITY  08/07/2011   Procedure: IRRIGATION AND DEBRIDEMENT EXTREMITY;  Surgeon: Norleen Armor, MD;  Location: [facility];  Service: Orthopedics;  Laterality: Left;  irrigation and debridement left foot abscess   JOINT REPLACEMENT  02-03-23   KNEE ARTHROSCOPY Right    TOTAL KNEE ARTHROPLASTY Left 02/01/2023   Procedure: LEFT TOTAL KNEE ARTHROPLASTY;  Surgeon: [name], MD;  Location: [facility];  Service: Orthopedics;  Laterality: Left;   TOTAL KNEE ARTHROPLASTY Right 01/13/2024   Procedure: ARTHROPLASTY, KNEE, TOTAL;  Surgeon: [name], MD;  Location: [facility];  Service: Orthopedics;  Laterality: Right;   Patient Active Problem List   Diagnosis Date Noted    Status post total right knee replacement 01/13/2024   Unilateral primary osteoarthritis, right knee 10/05/2023   Status post total left knee replacement 02/01/2023   Bilateral lower extremity edema 06/21/2018   Cellulitis of left lower leg 08/21/2016   Eosinophilia 03/18/2014   Lumbar spinal stenosis    Benign prostatic hyperplasia (BPH) with urinary urge incontinence 08/09/2011    PCP: Butler ONEIDA Burr, MD  REFERRING PROVIDER: [name], MD  REFERRING DIAG:  M17.11 (ICD-10-CM) - Unilateral primary osteoarthritis, right knee    THERAPY DIAG:  Stiffness of right knee, not elsewhere classified  Difficulty in walking, not elsewhere classified  Acute pain of right knee  Muscle weakness (generalized)  Localized edema  Chronic pain of left knee  Stiffness of left knee, not elsewhere classified  Rationale for Evaluation and Treatment: Rehabilitation  ONSET DATE: 01/13/2024  SUBJECTIVE:   SUBJECTIVE STATEMENT:  This one is a lot harder than the left one was. Pain is alright this morning.   PERTINENT HISTORY: OA, HTN, lumbar spinal stenosis, previous Lt TKA 02/01/2023   PAIN:  Are you having pain? Yes: NPRS scale: At rest/lowest is a 1/0 and with increased activity/highest 4-5/10  on bike 7-8/10 Pain location: Rt knee Pain description: Ache, stiff Aggravating factors: End-range  flexion, sleeps 1-3 hours at a time Relieving factors: Muscle relaxer before bed  PRECAUTIONS: Other: Spinal stenosis limits prolonged standing and walking  RED FLAGS: None   WEIGHT BEARING RESTRICTIONS: No  FALLS:  Has patient fallen in last 6 months? No  LIVING ENVIRONMENT: Lives with: lives with their family and wife, son-in-law, daughter Lives in: House/apartment Stairs: 4 steps to get into house, uses walker with supervision Has following equipment at home: Environmental Consultant - 2 wheeled  OCCUPATION: Retired  PLOF: Independent with basic ADLs  PATIENT GOALS: Get mobility  back and get back to doing things without the walker  NEXT MD VISIT: 02/22/2024 at 9:30 AM  OBJECTIVE:  Note: Objective measures were completed at Evaluation unless otherwise noted.  DIAGNOSTIC FINDINGS: Right knee arthroplasty without immediate postoperative complication.   PATIENT SURVEYS:  PSFS: THE PATIENT SPECIFIC FUNCTIONAL SCALE  Place score of 0-10 (0 = unable to perform activity and 10 = able to perform activity at the same level as before injury or problem)  Activity Date:  01/26/24    Stairs 3    2.  Balance 3    3.  Walk independently 2    4.  Prolonged standing 3    Total Score 2.75      Total Score = Sum of activity scores/number of activities  Minimally Detectable Change: 3 points (for single activity); 2 points (for average score) [facility] (nd). The Patient Specific Functional Scale . Retrieved from [email or web address].[email or web address]   COGNITION: Overall cognitive status: Within functional limits for tasks assessed     SENSATION: Ed has no new complaints of peripheral pain or paresthesias  EDEMA:  Noted and not objectively assessed  LOWER EXTREMITY ROM:  Active ROM Left/Right  01/26/2024 Right  01/31/24 Right 02/08/24  Hip flexion     Hip extension     Hip abduction     Hip adduction     Hip internal rotation     Hip external rotation     Knee flexion 124/70 80A 100 AA  Knee extension 0/+3    Ankle dorsiflexion     Ankle plantarflexion     Ankle inversion     Ankle eversion      (Blank rows = not tested)  LOWER EXTREMITY STRENGTH:  Assessed in pounds with a hand-held dynamometer at 70 degrees knee flexion sitting Left/Right 01/26/2024   Hip flexion    Hip extension    Hip abduction    Hip adduction    Hip internal rotation    Hip external rotation    Knee flexion    Knee extension 59.9/20.9   Ankle dorsiflexion    Ankle plantarflexion    Ankle inversion    Ankle eversion     (Blank  rows = not tested)  GAIT: Distance walked: 50 feet Assistive device utilized: Walker - 2 wheeled Level of assistance: Modified independence Comments: Ed notes he has been going up stairs backwards with his walker  TODAY'S TREATMENT  R TKA  02/08/2024 TherAct Nustep L5 x 10 min total; intermittently pushed seat 12 to seat 10 for ROM and endurance Standing knee flexion self stretch in // bars on 6 box x 2 min Standing hamstring stretch heel on 6 box 2x30 Fwd step tap on 6 box 2x10 Side step tap on 6 box 2x10 Standing heel raise 2x10 Standing SLR 2# 2x10 Standing hamstring curl 2# 2x10 Side step 2# x4 laps in // bars   02/02/2024 TherEx Recumbent bike seat 10; 8 min half revolutions with hold for increased stretch Alternating LAQ and active knee flexion with contralateral LE opposing motion to facilitate better muscle contraction; 2# BLEs ankle weights 2 sets 10 reps Supine SLR 2 sets 20 reps. PT verbal cue to pull toes towards body for increased quad activation.  TherAct Standing alternating taps on 6 inch hurdle in parallel bar with BUE support 1 set 10 reps ; then 10 reps progress to step over hurdle and 5 sec stagger stance hold for balance step back to starting positing. PT verbal cue to decrease abduction in Rt leg and focus on increase hip and knee flexion range to clear hurdle. Double leg press 75#; 1 set 15 reps Single leg press 25#; 1 set 10 reps  Sit <>Stand from 24 tall stool in parallel bars without use of UE support 1 set 10 reps   Pt amb 52 ft with CGA and SPC-stand alone tip for transition from RW for household ambulation. Pt required verbal cue for cane placement and patterning.    Selfcare Patient education about modified standing using tall stool in parallel bars during home activities to decrease load on knee. He was able to have pelvis on seat & feet on floor for  Modified stand using table at 29 inch height to build standing tolerance. Patient demonstrated and verbalized understanding.  Modalities Vaso; knee elevation med compression 34* 10 mins   TREATMENT DATE:  R TKA 01/31/24 Scifit seat 13 5 min half circles Mini squats 2x10 Standing heel raises 2x10 LAQ 2x10 Supine quad sets 2x10 Supine heel slides 2x10 Seated marching to highlight hip flexion 2x10 Manual PT for flexion and gastroc stretching  Vaso 34 degrees med compression 5 min  TREATMENT 01/26/2024  Quadriceps sets with a pillow under both knees 10 x 5 seconds (only using the pillow because comfort and an adequate quadriceps contraction is more important than extension active range of motion at this point secondary to extension active range of motion being most limited by edema and only 3 degrees from neutral) Tailgate knee flexion 1 minute Seated active assisted knee flexion 5 x 10 seconds with a TKE between reps  Manual:  Patellar mobilizations 5 minutes  [id number]: Discussed the early emphasis on flexion active range of motion, edema control and quadriceps strength.  Mentioned that we will keep an eye on extension active range of motion, although this is expected to improve as edema decreases.  Reviewed day 1 exam findings and home exercise program.   PATIENT EDUCATION:  Education details: See above Person educated: Patient Education method: Explanation, Demonstration, Tactile cues, Verbal cues, and Handouts Education comprehension: verbalized understanding, returned demonstration, verbal cues required, tactile cues required, and needs further education  HOME EXERCISE PROGRAM: Access Code: [email or web address] URL: [email or web address] Date: 01/26/2024 Prepared by: Lamar Ivory  Exercises - Supine Quadricep Sets  - 10 x daily - 7 x weekly - 1 sets - 10 reps - 5 second hold - Seated Knee Flexion AAROM  -  10 x daily - 7 x weekly - 1 sets - 5 reps - 10 seconds hold - Seated  Long Arc Quad  - 10 x daily - 7 x weekly - 1 sets - 5 reps - 2 seconds hold  ASSESSMENT:  CLINICAL IMPRESSION:  Improving knee ROM. Continued to work on improving weight shift, balance, strength and stability using steps today. Pt limited due to lumbar stenosis -- required seated rest breaks.   OBJECTIVE IMPAIRMENTS: Abnormal gait, decreased activity tolerance, decreased endurance, difficulty walking, decreased ROM, decreased strength, increased edema, impaired perceived functional ability, obesity, and pain.   ACTIVITY LIMITATIONS: bending, standing, squatting, sleeping, stairs, transfers, bed mobility, dressing, and locomotion level  PARTICIPATION LIMITATIONS: interpersonal relationship, driving, shopping, and community activity  PERSONAL FACTORS: OA, HTN, lumbar spinal stenosis, previous Lt TKA 02/01/2023 are also affecting patient's functional outcome.   REHAB POTENTIAL: Good  CLINICAL DECISION MAKING: Evolving/moderate complexity  EVALUATION COMPLEXITY: Moderate   GOALS:  Goals reviewed with patient? Yes  SHORT TERM GOALS: Target date: 02/23/2024 Ed will be independent with his day 1 home exercise program Baseline: Started 01/26/2024 Goal status: Ongoing 02/02/24  2.  Improve right knee active range of motion to 0 - 2 -90 degrees Baseline: 0 - 3 - 70 degrees Goal status: Ongoing 02/02/24  3.  Improve right quadriceps strength to at least 30 pounds Baseline: 20.9 pounds Goal status: Ongoing 02/02/24  4.  Ed will be comfortable with a single-point cane outside the home and will only rely on his 2 wheeled walker for long distances Baseline: 2 wheeled walker most of the time Goal status: Ongoing 02/02/24  LONG TERM GOALS: Target date: 03/22/2024  Improved patient-specific functional scale to 5.75 or better Baseline: 2.75 Goal status: Ongoing 02/02/24  2.  And will report right knee pain consistently 0-3/10 on the numeric pain rating scale Baseline: Pretty much stays in the  3-5 out of 10 range, increased pain reported with endrange flexion Goal status: Ongoing 02/02/24  3.  Improve right knee flexion active range of motion to 110 degrees or better (extension should improve as edema decreases over time) Baseline: 70 degrees Goal status: Ongoing 02/02/24  4.  Improve objective right quadriceps strength to at least 50 pounds (80+ pounds recommended 9 to 12 months post-surgery) Baseline: 20.9 pounds Goal status: Ongoing 02/02/24  5.  Ed will be independent without an assistive device Baseline: 2 wheeled walker most of the time Goal status: Ongoing 02/02/24  6.  Ed will be independent with his long-term home exercise program at discharge Baseline: Started 01/26/2024 Goal status: Ongoing 02/02/24   PLAN:  PT FREQUENCY: 2-3 times a week  PT DURATION: 8 weeks  PLANNED INTERVENTIONS: 97750- Physical Performance Testing, 97110-Therapeutic exercises, 97530- Therapeutic activity, 97112- Neuromuscular re-education, 97535- Self Care, [id number]- Manual therapy, [phone]- Gait training, [phone]- Electrical stimulation (unattended), [id number]- Vasopneumatic device, Patient/Family education, Balance training, Stair training, Joint mobilization, and Cryotherapy  PLAN FOR NEXT SESSION: Continue knee flexion AROM, balance, and functional strengthening; Increase distance using cane. Emphasis on Rt hip/knee flexion for clearance during step task. Check on rollator walker safety.   Bellamarie Pflug April Ma L Elfrieda Espino, PT, DPT 02/08/2024, 8:03 AM    "

## 2024-02-09 ENCOUNTER — Encounter: Payer: Self-pay | Admitting: Rehabilitative and Restorative Service Providers"

## 2024-02-09 ENCOUNTER — Ambulatory Visit: Admitting: Rehabilitative and Restorative Service Providers"

## 2024-02-09 DIAGNOSIS — R262 Difficulty in walking, not elsewhere classified: Secondary | ICD-10-CM

## 2024-02-09 DIAGNOSIS — M25561 Pain in right knee: Secondary | ICD-10-CM | POA: Diagnosis not present

## 2024-02-09 DIAGNOSIS — M6281 Muscle weakness (generalized): Secondary | ICD-10-CM | POA: Diagnosis not present

## 2024-02-09 DIAGNOSIS — M25661 Stiffness of right knee, not elsewhere classified: Secondary | ICD-10-CM | POA: Diagnosis not present

## 2024-02-09 DIAGNOSIS — R600 Localized edema: Secondary | ICD-10-CM | POA: Diagnosis not present

## 2024-02-09 NOTE — Therapy (Signed)
 " OUTPATIENT PHYSICAL THERAPY LOWER EXTREMITY TREATMENT   Patient Name: Keith Watkins MRN: [id number] DOB:[birth year], 79 y.o., male Today's Date: 02/09/2024  END OF SESSION:  PT End of Session - 02/09/24 0845     Visit Number 5    Number of Visits 24    Date for Recertification  03/22/24    Authorization Type [employer]    Authorization Time Period Through 03/22/2024    Authorization - Visit Number 5    Authorization - Number of Visits 24    Progress Note Due on Visit 10    PT Start Time 0845    PT Stop Time 0938    PT Time Calculation (min) 53 min    Equipment Utilized During Treatment --    Activity Tolerance Patient tolerated treatment well;No increased pain;Patient limited by pain    Behavior During Therapy [facility] for tasks assessed/performed             Past Medical History:  Diagnosis Date   Arthritis    Back pain    Cataract    Cellulitis 07/31/2011   Complication of anesthesia    Pt wakes up too quickly   Enlarged prostate    Foot abscess, left 08/07/2011   Hypertension    prior to gastric bypass- no meds taken   Lumbar spinal stenosis    Obesity 08/09/2011   Past Surgical History:  Procedure Laterality Date   APPENDECTOMY  1963   CHOLECYSTECTOMY  1984   GASTRIC FUNDOPLICATION  1982   at [facility]   I & D EXTREMITY  08/07/2011   Procedure: IRRIGATION AND DEBRIDEMENT EXTREMITY;  Surgeon: Norleen Armor, MD;  Location: [facility];  Service: Orthopedics;  Laterality: Left;  irrigation and debridement left foot abscess   JOINT REPLACEMENT  02-03-23   KNEE ARTHROSCOPY Right    TOTAL KNEE ARTHROPLASTY Left 02/01/2023   Procedure: LEFT TOTAL KNEE ARTHROPLASTY;  Surgeon: [name], MD;  Location: [facility] OR;  Service: Orthopedics;  Laterality: Left;   TOTAL KNEE ARTHROPLASTY Right 01/13/2024   Procedure: ARTHROPLASTY, KNEE, TOTAL;  Surgeon: [name], MD;  Location: [facility];  Service: Orthopedics;  Laterality: Right;   Patient Active  Problem List   Diagnosis Date Noted   Status post total right knee replacement 01/13/2024   Unilateral primary osteoarthritis, right knee 10/05/2023   Status post total left knee replacement 02/01/2023   Bilateral lower extremity edema 06/21/2018   Cellulitis of left lower leg 08/21/2016   Eosinophilia 03/18/2014   Lumbar spinal stenosis    Benign prostatic hyperplasia (BPH) with urinary urge incontinence 08/09/2011    PCP: Butler ONEIDA Burr, MD  REFERRING PROVIDER: [name], MD  REFERRING DIAG:  M17.11 (ICD-10-CM) - Unilateral primary osteoarthritis, right knee    THERAPY DIAG:  Stiffness of right knee, not elsewhere classified  Difficulty in walking, not elsewhere classified  Acute pain of right knee  Muscle weakness (generalized)  Localized edema  Rationale for Evaluation and Treatment: Rehabilitation  ONSET DATE: 01/13/2024  SUBJECTIVE:   SUBJECTIVE STATEMENT:  [name] is getting about 3-4 hours of uninterrupted sleep.  He is taking a muscle relaxer and extra strength tylenol .  No oxycodone  in ~ 10 days.  PERTINENT HISTORY: OA, HTN, lumbar spinal stenosis, previous Lt TKA 02/01/2023   PAIN:  Are you having pain? Yes: NPRS scale: 3-9/10 this week Pain location: Rt knee Pain description: Ache, stiff Aggravating factors: End-range flexion, sleeps ~3-4 hours at a time Relieving factors: Muscle  relaxer and maybe tylenol  before bed  PRECAUTIONS: Other: Spinal stenosis limits prolonged standing and walking  RED FLAGS: None   WEIGHT BEARING RESTRICTIONS: No  FALLS:  Has patient fallen in last 6 months? No  LIVING ENVIRONMENT: Lives with: lives with their family and wife, son-in-law, daughter Lives in: House/apartment Stairs: 4 steps to get into house, uses walker with supervision Has following equipment at home: Environmental Consultant - 2 wheeled  OCCUPATION: Retired  PLOF: Independent with basic ADLs  PATIENT GOALS: Get mobility back and get back to doing  things without the walker  NEXT MD VISIT: 02/22/2024 at 9:30 AM  OBJECTIVE:  Note: Objective measures were completed at Evaluation unless otherwise noted.  DIAGNOSTIC FINDINGS: Right knee arthroplasty without immediate postoperative complication.   PATIENT SURVEYS:  PSFS: THE PATIENT SPECIFIC FUNCTIONAL SCALE  Place score of 0-10 (0 = unable to perform activity and 10 = able to perform activity at the same level as before injury or problem)  Activity Date:  01/26/24    Stairs 3    2.  Balance 3    3.  Walk independently 2    4.  Prolonged standing 3    Total Score 2.75      Total Score = Sum of activity scores/number of activities  Minimally Detectable Change: 3 points (for single activity); 2 points (for average score) [facility] (nd). The Patient Specific Functional Scale . Retrieved from [email or web address].pt   COGNITION: Overall cognitive status: Within functional limits for tasks assessed     SENSATION: [name] has no new complaints of peripheral pain or paresthesias  EDEMA:  Noted and not objectively assessed  LOWER EXTREMITY ROM:  Active ROM Left/Right  01/26/2024 Right  01/31/24 Right 02/08/24 Right 02/09/2024  Hip flexion      Hip extension      Hip abduction      Hip adduction      Hip internal rotation      Hip external rotation      Knee flexion 124/70 80A 100 AA Active 100  Knee extension 0/+3   +2  Ankle dorsiflexion      Ankle plantarflexion      Ankle inversion      Ankle eversion       (Blank rows = not tested)  LOWER EXTREMITY STRENGTH:  Assessed in pounds with a hand-held dynamometer at 70 degrees knee flexion sitting Left/Right 01/26/2024   Hip flexion    Hip extension    Hip abduction    Hip adduction    Hip internal rotation    Hip external rotation    Knee flexion    Knee extension 59.9/20.9   Ankle dorsiflexion    Ankle plantarflexion    Ankle inversion    Ankle eversion      (Blank rows = not tested)  GAIT: Distance walked: 50 feet Assistive device utilized: Walker - 2 wheeled Level of assistance: Modified independence Comments: [name] notes he has been going up stairs backwards with his walker  TODAY'S TREATMENT  R TKA  02/09/2024 NuStep for 7 minutes, gradual increase in knee flexion as time goes on [place] knee flexion 1 minute Active-assisted knee flexion 10 x 10 seconds, left pushes right into flexion Quad sets with and without heel prop 10 x 5 seconds each (10 reps with and 10 reps without)  Functional Activities: Double Leg Press full range 75# 20 reps with slow eccentrics and 2-3 second hold in end-range extension and flexion Single Leg Press full range 37# 15 reps with slow eccentrics and 2-3 second hold in end-range extension and flexion Sit to stand no hands, slow eccentrics from slightly elevated high/low table (6 inches above chair 5 reps with slow eccentrics  Vaso Right Knee High Pressure 34* 10 minutes   02/08/2024 TherAct Nustep L5 x 10 min total; intermittently pushed seat 12 to seat 10 for ROM and endurance Standing knee flexion self stretch in // bars on 6 box x 2 min Standing hamstring stretch heel on 6 box 2x30 Fwd step tap on 6 box 2x10 Side step tap on 6 box 2x10 Standing heel raise 2x10 Standing SLR 2# 2x10 Standing hamstring curl 2# 2x10 Side step 2# x4 laps in // bars   02/02/2024 TherEx Recumbent bike seat 10; 8 min half revolutions with hold for increased stretch Alternating LAQ and active knee flexion with contralateral LE opposing motion to facilitate better muscle contraction; 2# BLEs ankle weights 2 sets 10 reps Supine SLR 2 sets 20 reps. PT verbal cue to pull toes towards body for increased quad activation.  TherAct Standing alternating taps on 6 inch hurdle in parallel bar with BUE support 1 set 10 reps ; then 10 reps  progress to step over hurdle and 5 sec stagger stance hold for balance step back to starting positing. PT verbal cue to decrease abduction in Rt leg and focus on increase hip and knee flexion range to clear hurdle. Double leg press 75#; 1 set 15 reps Single leg press 25#; 1 set 10 reps  Sit <>Stand from 24 tall stool in parallel bars without use of UE support 1 set 10 reps   Pt amb 52 ft with CGA and SPC-stand alone tip for transition from RW for household ambulation. Pt required verbal cue for cane placement and patterning.    Selfcare Patient education about modified standing using tall stool in parallel bars during home activities to decrease load on knee. He was able to have pelvis on seat & feet on floor for Modified stand using table at 29 inch height to build standing tolerance. Patient demonstrated and verbalized understanding.  Modalities Vaso; knee elevation med compression 34* 10 mins   PATIENT EDUCATION:  Education details: See above Person educated: Patient Education method: Explanation, Demonstration, Tactile cues, Verbal cues, and Handouts Education comprehension: verbalized understanding, returned demonstration, verbal cues required, tactile cues required, and needs further education  HOME EXERCISE PROGRAM: Access Code: [email or web address] URL: [email or web address] Date: 01/26/2024 Prepared by: Lamar Ivory  Exercises - Supine Quadricep Sets  - 10 x daily - 7 x weekly - 1 sets - 10 reps - 5 second hold - Seated Knee Flexion AAROM  - 10 x daily - 7 x weekly - 1 sets - 5 reps - 10 seconds hold - Seated Long Arc Quad  - 10 x daily - 7 x weekly - 1 sets - 5 reps - 2 seconds hold  ASSESSMENT:  CLINICAL IMPRESSION:  AROM 0 - 2 - 100 degrees today.  [name] is making great  progress towards long-term goals.  Flexion AROM, edema control and quadriceps/lower extremity strength remain high priorities.  Extension is only lacking 2 degrees and appears to be mostly edema  limited.  OBJECTIVE IMPAIRMENTS: Abnormal gait, decreased activity tolerance, decreased endurance, difficulty walking, decreased ROM, decreased strength, increased edema, impaired perceived functional ability, obesity, and pain.   ACTIVITY LIMITATIONS: bending, standing, squatting, sleeping, stairs, transfers, bed mobility, dressing, and locomotion level  PARTICIPATION LIMITATIONS: interpersonal relationship, driving, shopping, and community activity  PERSONAL FACTORS: OA, HTN, lumbar spinal stenosis, previous Lt TKA 02/01/2023 are also affecting patient's functional outcome.   REHAB POTENTIAL: Good  CLINICAL DECISION MAKING: Evolving/moderate complexity  EVALUATION COMPLEXITY: Moderate   GOALS:  Goals reviewed with patient? Yes  SHORT TERM GOALS: Target date: 02/23/2024 [name] will be independent with his day 1 home exercise program Baseline: Started 01/26/2024 Goal status: Met 02/09/2024  2.  Improve right knee active range of motion to 0 - 2 - 90 degrees Baseline: 0 - 3 - 70 degrees Goal status: Met 02/09/2024  3.  Improve right quadriceps strength to at least 30 pounds Baseline: 20.9 pounds Goal status: Ongoing 02/02/24  4.  [name] will be comfortable with a single-point cane outside the home and will only rely on his 2 wheeled walker for long distances Baseline: 2 wheeled walker most of the time Goal status: Ongoing 02/09/2024  LONG TERM GOALS: Target date: 03/22/2024  Improved patient-specific functional scale to 5.75 or better Baseline: 2.75 Goal status: Ongoing 02/02/24  2.  And will report right knee pain consistently 0-3/10 on the numeric pain rating scale Baseline: Pretty much stays in the 3-5 out of 10 range, increased pain reported with endrange flexion Goal status: Ongoing 02/09/2024  3.  Improve right knee flexion active range of motion to 110 degrees or better (extension should improve as edema decreases over time) Baseline: 70 degrees Goal status: Ongoing 12/08/2024  4.   Improve objective right quadriceps strength to at least 50 pounds (80+ pounds recommended 9 to 12 months post-surgery) Baseline: 20.9 pounds Goal status: Ongoing 02/02/24  5.  [name] will be independent without an assistive device Baseline: 2 wheeled walker most of the time Goal status: Ongoing 02/09/2024  6.  [name] will be independent with his long-term home exercise program at discharge Baseline: Started 01/26/2024 Goal status: Ongoing 02/09/2024   PLAN:  PT FREQUENCY: 2-3 times a week  PT DURATION: 8 weeks  PLANNED INTERVENTIONS: 97750- Physical Performance Testing, 97110-Therapeutic exercises, 97530- Therapeutic activity, 97112- Neuromuscular re-education, 97535- Self Care, [id number]- Manual therapy, [phone]- Gait training, [phone]- Electrical stimulation (unattended), [id number]- Vasopneumatic device, Patient/Family education, Balance training, Stair training, Joint mobilization, and Cryotherapy  PLAN FOR NEXT SESSION: Continue knee flexion AROM, balance, and functional strengthening; Increase gait training with a cane.  Emphasis on Rt hip/knee flexion for clearance during step task.  Check on rollator walker safety.   Myer LELON Ivory, PT, MPT 02/09/2024, 10:29 AM    "

## 2024-02-10 ENCOUNTER — Encounter: Admitting: Rehabilitative and Restorative Service Providers"

## 2024-02-14 ENCOUNTER — Encounter: Admitting: Rehabilitative and Restorative Service Providers"

## 2024-02-16 ENCOUNTER — Encounter

## 2024-02-21 ENCOUNTER — Encounter: Admitting: Rehabilitative and Restorative Service Providers"

## 2024-02-22 ENCOUNTER — Encounter: Admitting: Orthopaedic Surgery

## 2024-02-22 ENCOUNTER — Encounter: Admitting: Rehabilitative and Restorative Service Providers"

## 2024-02-23 ENCOUNTER — Encounter: Admitting: Rehabilitative and Restorative Service Providers"

## 2024-07-25 ENCOUNTER — Ambulatory Visit
